# Patient Record
Sex: Male | Born: 1950
Health system: Southern US, Community
[De-identification: ages and names within clinical notes are randomized; demographics above are authoritative.]

## PROBLEM LIST (undated history)

## (undated) DIAGNOSIS — C801 Malignant (primary) neoplasm, unspecified: Secondary | ICD-10-CM

## (undated) DIAGNOSIS — E785 Hyperlipidemia, unspecified: Secondary | ICD-10-CM

## (undated) DIAGNOSIS — I4891 Unspecified atrial fibrillation: Secondary | ICD-10-CM

## (undated) DIAGNOSIS — R05 Cough: Secondary | ICD-10-CM

## (undated) DIAGNOSIS — E876 Hypokalemia: Secondary | ICD-10-CM

## (undated) DIAGNOSIS — R55 Syncope and collapse: Secondary | ICD-10-CM

## (undated) DIAGNOSIS — L570 Actinic keratosis: Secondary | ICD-10-CM

## (undated) DIAGNOSIS — I10 Essential (primary) hypertension: Secondary | ICD-10-CM

## (undated) DIAGNOSIS — R054 Cough syncope: Secondary | ICD-10-CM

## (undated) HISTORY — DX: Cough: R05

## (undated) HISTORY — DX: Syncope and collapse: R55

## (undated) HISTORY — DX: Hypokalemia: E87.6

## (undated) HISTORY — DX: Hyperlipidemia, unspecified: E78.5

## (undated) HISTORY — DX: Essential (primary) hypertension: I10

## (undated) HISTORY — DX: Cough syncope: R05.4

## (undated) HISTORY — DX: Actinic keratosis: L57.0

## (undated) HISTORY — DX: Malignant (primary) neoplasm, unspecified: C80.1

## (undated) HISTORY — DX: Unspecified atrial fibrillation: I48.91

## (undated) HISTORY — PX: TONSILLECTOMY AND ADENOIDECTOMY: SUR1326

---

## 2005-07-15 ENCOUNTER — Ambulatory Visit: Payer: Self-pay | Admitting: Gastroenterology

## 2005-07-15 LAB — HM COLONOSCOPY

## 2009-01-05 ENCOUNTER — Inpatient Hospital Stay (HOSPITAL_COMMUNITY): Admission: EM | Admit: 2009-01-05 | Discharge: 2009-01-06 | Payer: Self-pay | Admitting: Emergency Medicine

## 2009-01-05 ENCOUNTER — Ambulatory Visit: Payer: Self-pay | Admitting: Internal Medicine

## 2009-01-07 ENCOUNTER — Ambulatory Visit: Payer: Self-pay | Admitting: Cardiology

## 2009-01-07 ENCOUNTER — Observation Stay (HOSPITAL_COMMUNITY): Admission: EM | Admit: 2009-01-07 | Discharge: 2009-01-10 | Payer: Self-pay | Admitting: Emergency Medicine

## 2009-01-07 ENCOUNTER — Telehealth: Payer: Self-pay | Admitting: Nurse Practitioner

## 2009-01-09 ENCOUNTER — Encounter: Payer: Self-pay | Admitting: Cardiology

## 2009-01-17 ENCOUNTER — Telehealth (INDEPENDENT_AMBULATORY_CARE_PROVIDER_SITE_OTHER): Payer: Self-pay

## 2009-01-18 ENCOUNTER — Ambulatory Visit: Payer: Self-pay

## 2009-01-18 ENCOUNTER — Ambulatory Visit: Payer: Self-pay | Admitting: Cardiology

## 2009-01-18 ENCOUNTER — Encounter (HOSPITAL_COMMUNITY): Admission: RE | Admit: 2009-01-18 | Discharge: 2009-02-08 | Payer: Self-pay | Admitting: Internal Medicine

## 2009-01-20 DIAGNOSIS — E785 Hyperlipidemia, unspecified: Secondary | ICD-10-CM | POA: Insufficient documentation

## 2009-01-20 DIAGNOSIS — M109 Gout, unspecified: Secondary | ICD-10-CM | POA: Insufficient documentation

## 2009-01-23 ENCOUNTER — Ambulatory Visit: Payer: Self-pay | Admitting: Cardiology

## 2009-01-23 ENCOUNTER — Ambulatory Visit: Payer: Self-pay | Admitting: Internal Medicine

## 2009-01-23 ENCOUNTER — Ambulatory Visit: Payer: Self-pay

## 2009-01-23 ENCOUNTER — Encounter: Payer: Self-pay | Admitting: Internal Medicine

## 2009-01-23 ENCOUNTER — Ambulatory Visit (HOSPITAL_COMMUNITY): Admission: RE | Admit: 2009-01-23 | Discharge: 2009-01-23 | Payer: Self-pay | Admitting: Cardiology

## 2009-01-23 DIAGNOSIS — R059 Cough, unspecified: Secondary | ICD-10-CM | POA: Insufficient documentation

## 2009-01-23 DIAGNOSIS — I4891 Unspecified atrial fibrillation: Secondary | ICD-10-CM | POA: Insufficient documentation

## 2009-01-23 DIAGNOSIS — R05 Cough: Secondary | ICD-10-CM

## 2009-01-23 DIAGNOSIS — I309 Acute pericarditis, unspecified: Secondary | ICD-10-CM | POA: Insufficient documentation

## 2009-01-27 ENCOUNTER — Telehealth (INDEPENDENT_AMBULATORY_CARE_PROVIDER_SITE_OTHER): Payer: Self-pay | Admitting: Physician Assistant

## 2009-01-31 ENCOUNTER — Encounter: Payer: Self-pay | Admitting: Internal Medicine

## 2009-01-31 ENCOUNTER — Ambulatory Visit: Payer: Self-pay | Admitting: Internal Medicine

## 2009-01-31 ENCOUNTER — Ambulatory Visit: Payer: Self-pay

## 2009-01-31 DIAGNOSIS — E876 Hypokalemia: Secondary | ICD-10-CM | POA: Insufficient documentation

## 2009-02-02 ENCOUNTER — Telehealth (INDEPENDENT_AMBULATORY_CARE_PROVIDER_SITE_OTHER): Payer: Self-pay | Admitting: *Deleted

## 2009-02-02 ENCOUNTER — Telehealth: Payer: Self-pay | Admitting: Adult Health

## 2009-02-02 LAB — CONVERTED CEMR LAB
Calcium: 9 mg/dL (ref 8.4–10.5)
GFR calc non Af Amer: 59.93 mL/min (ref >60)
Potassium: 4.4 meq/L (ref 3.5–5.1)
Sodium: 143 meq/L (ref 135–145)

## 2009-02-20 ENCOUNTER — Ambulatory Visit: Payer: Self-pay | Admitting: Internal Medicine

## 2009-02-20 DIAGNOSIS — I10 Essential (primary) hypertension: Secondary | ICD-10-CM | POA: Insufficient documentation

## 2009-03-06 ENCOUNTER — Ambulatory Visit: Payer: Self-pay | Admitting: Family Medicine

## 2009-04-19 ENCOUNTER — Encounter: Payer: Self-pay | Admitting: Internal Medicine

## 2009-04-20 ENCOUNTER — Encounter: Payer: Self-pay | Admitting: Internal Medicine

## 2009-04-21 ENCOUNTER — Ambulatory Visit: Payer: Self-pay | Admitting: Internal Medicine

## 2009-07-21 ENCOUNTER — Encounter: Payer: Self-pay | Admitting: Internal Medicine

## 2009-07-31 ENCOUNTER — Ambulatory Visit: Payer: Self-pay | Admitting: Internal Medicine

## 2009-12-20 ENCOUNTER — Encounter: Payer: Self-pay | Admitting: Internal Medicine

## 2009-12-25 ENCOUNTER — Encounter: Payer: Self-pay | Admitting: Internal Medicine

## 2010-01-17 ENCOUNTER — Ambulatory Visit: Payer: Self-pay | Admitting: Internal Medicine

## 2010-01-17 ENCOUNTER — Encounter: Payer: Self-pay | Admitting: Internal Medicine

## 2010-02-26 ENCOUNTER — Ambulatory Visit: Payer: Self-pay | Admitting: Family Medicine

## 2010-03-14 NOTE — Progress Notes (Signed)
Summary: Patient's at Home Vitals  Patient's at Home Vitals   Imported By: Marylou Mccoy 02/22/2009 13:32:51  _____________________________________________________________________  External Attachment:    Type:   Image     Comment:   External Document

## 2010-03-14 NOTE — Progress Notes (Signed)
Summary: Patient At Home Vitals   Patient At Home Vitals   Imported By: Roderic Ovens 05/04/2009 12:12:49  _____________________________________________________________________  External Attachment:    Type:   Image     Comment:   External Document

## 2010-03-14 NOTE — Assessment & Plan Note (Signed)
Summary: per check out/sf   Visit Type:  Follow-up Primary Provider:  Franne Forts   History of Present Illness: The patient presents today for routine electrophysiology followup. He reports doing very well since last being seen in our clinic. The patient denies symptoms of palpitations, chest pain, shortness of breath, orthopnea, PND, lower extremity edema, dizziness, presyncope, syncope, or neurologic sequela. The patient is tolerating medications without difficulties and is otherwise without complaint today.   Current Medications (verified): 1)  Metoprolol Succinate 50 Mg Xr24h-Tab (Metoprolol Succinate) .... Take 1/2 Tab Two Times A Day 2)  Flecainide Acetate 100 Mg Tabs (Flecainide Acetate) .... Take One Half Tablet By Mouth Every 12 Hours 3)  Aspirin Ec 325 Mg Tbec (Aspirin) .... Take One Tablet By Mouth Daily 4)  Citracal Plus  Tabs (Multiple Minerals-Vitamins) .... 2 Tabs Once Daily 5)  Colchicine 0.6 Mg Tabs (Colchicine) .... Take One Tablet By Mouth Once Daily. 6)  Cyclobenzaprine Hcl 10 Mg Tabs (Cyclobenzaprine Hcl) .... As Needed 7)  Fish Oil   Oil (Fish Oil) .... 3 Caps Once Daily 8)  Meloxicam 15 Mg Tabs (Meloxicam) .Marland Kitchen.. 1 Tab As Needed 9)  Simvastatin 20 Mg Tabs (Simvastatin) .... Take One Tablet By Mouth Daily At Bedtime 10)  Tricor 145 Mg Tabs (Fenofibrate) .... Take One Tablet By Mouth Once Daily. 11)  Vitamin D (Ergocalciferol) 50000 Unit Caps (Ergocalciferol) .... Monthly 12)  Zolpidem Tartrate 10 Mg Tabs (Zolpidem Tartrate) .... At Bedtime As Needed 13)  Hydrochlorothiazide 25 Mg Tabs (Hydrochlorothiazide) .... Take One Tablet By Mouth Daily.  Allergies (verified): No Known Drug Allergies  Past History:  Past Medical History: paroxysmal atrial fibrillation and atrial flutter Gout  HL HTN DM  Family History: Reviewed history from 01/20/2009 and no changes required.  His mother is 47 with paroxysmal AFib.  His father died   of MI at 40.   Social History:  He denies any tobacco use.  He occasionally drinks   alcohol.  He is married.   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  Lives in [place].  Review of Systems       All systems are reviewed and negative except as listed in the HPI.    Vital Signs:  Patient profile:   60 year old male Height:      70 inches Weight:      189 pounds BMI:     27.22 Pulse rate:   56 / minute BP sitting:   122 / 80  (left arm)  Vitals Entered By: Laurance Flatten CMA (February 20, 2009 10:30 AM)  Physical Exam  General:  Well developed, well nourished, in no acute distress. Head:  normocephalic and atraumatic Eyes:  PERRLA/EOM intact; conjunctiva and lids normal. Nose:  no deformity, discharge, inflammation, or lesions Mouth:  Teeth, gums and palate normal. Oral mucosa normal. Neck:  Neck supple, no JVD. No masses, thyromegaly or abnormal cervical nodes. Lungs:  Clear bilaterally to auscultation and percussion. Heart:  RRR, rub has resolved. no murmurs Abdomen:  Bowel sounds positive; abdomen soft and non-tender without masses, organomegaly, or hernias noted. No hepatosplenomegaly. Msk:  Back normal, normal gait. Muscle strength and tone normal. Pulses:  pulses normal in all 4 extremities Extremities:  No clubbing or cyanosis. Neurologic:  Alert and oriented x 3.  CNII-XII intact, strength/sensation are intact Skin:  Intact without lesions or rashes. Cervical Nodes:  no significant adenopathy Psych:  Normal affect.   Nuclear Study  Procedure date:  01/18/2009  Findings:       Overall Impression   Exercise Capacity: Fair exercise capacity. BP Response: Normal blood pressure response. Clinical Symptoms: There is dyspnea. ECG Impression: No significant ST segment change suggestive of ischemia. Overall Impression: There is no sign of scar or ischemia; note patient achieved THR of 76% and ischemia at higher work loads cannot be excluded.    Signed by Ferman Hamming, MD,  [facility] on 01/18/2009 at 5:59 PM   Echocardiogram  Procedure date:  01/09/2009  Findings:       Study Conclusions    1. Left ventricle: The cavity size was normal. Wall thickness was      normal. Systolic function was normal. The estimated ejection      fraction was in the range of 60% to 65%. Wall motion was normal;      there were no regional wall motion abnormalities. Doppler      parameters are consistent with abnormal left ventricular      relaxation (grade 1 diastolic dysfunction).   2. Aortic valve: There was no stenosis.   3. Mitral valve: No significant regurgitation.   4. Left atrium: The atrium was mildly dilated.   5. Right ventricle: The cavity size was normal. Systolic function      was normal.   6. Pulmonary arteries: PA peak pressure: 24mm Hg (S).   7. Systemic veins: The IVC was not visualized.   Impressions:    - Normal LV size and systolic function, EF 60-65%. No regional wall     motion abnormalities. No significant valvular abnormalities. Mild     left atrial enlargement.   Transthoracic echocardiography. M-mode, complete 2D, spectral   Doppler, and color Doppler. Height: Height: 177.8cm. Height: 70in.   Weight: Weight: 191kg. Weight: 420.2lb. Body mass index: BMI:   60.4kg/m^2. Body surface area: BSA: 3.40m^2. Patient status:   Inpatient.  Prepared and Electronically Authenticated by    Marca Ancona, MD  CXR  Procedure date:  01/05/2009  Findings:       Findings: The heart size and pulmonary vascularity are normal and   the lungs are clear.  No acute bony abnormalities.    IMPRESSION:   No acute disease.    Read By:  Gwynn Burly,  M.D.    Signed by Laurance Flatten CMA on 01/20/2009 at 4:29 PM  ________________________________________________________________________     Echocardiogram  Procedure date:  01/23/2009  Findings:       Study Conclusions    - Left ventricle: The cavity size was normal. Wall thickness was     normal.  Systolic function was normal. The estimated ejection     fraction was in the range of 50% to 55%. Wall motion was normal;     there were no regional wall motion abnormalities.   - Left atrium: The atrium was mildly dilated.   - Pericardium, extracardiac: A small pericardial effusion was     identified.   Impressions:    - Limited study to R/O pericardial effusion; no doppler performed.   Transthoracic echocardiography. M-mode, limited 2D, limited spectral   Doppler, and color Doppler. Height: Height: 177.8cm. Height: 70in.   Weight: Weight: 93.6kg. Weight: 206lb. Body mass index: BMI:   29.6kg/m^2. Body surface area: BSA: 2.43m^2. Blood pressure: 144/80.   Patient status: Outpatient. Location: [facility]    Prepared and Electronically Authenticated by    Olga Millers, MD, [facility]    EKG  Procedure date:  02/20/2009  Findings:      sinus rhythm 56 bpm, PR 230, TWI in II/III/VF, otherwise normal ekg  Impression & Recommendations:  Problem # 1:  ATRIAL FIBRILLATION (ICD-427.31) Doing well without further symptoms of arrhythmia. Tolerating flecainide. He has recently been diagnosed with both HTN and DM.  This elevates his CHADS2 score to 2 and thus alters his risks for stroke. We had a long discussion today regarding aspirin, vs coumadin or dabigatran. The patient has chosen dabigatran for stroke prevention.  We will therefore start 150mg  two times a day today.  His updated medication list for this problem includes:    Metoprolol Succinate 50 Mg Xr24h-tab (Metoprolol succinate) .Marland Kitchen... Take 1/2 tab two times a day    Flecainide Acetate 100 Mg Tabs (Flecainide acetate) .Marland Kitchen... Take one half tablet by mouth every 12 hours    Aspirin Ec 325 Mg Tbec (Aspirin) .Marland Kitchen... Take one tablet by mouth daily  Problem # 2:  COUGH SYNCOPE (ICD-786.2) No further afib.  His updated medication list for this problem includes:    Metoprolol Succinate 50 Mg Xr24h-tab (Metoprolol succinate) .Marland Kitchen...  Take 1/2 tab two times a day    Flecainide Acetate 100 Mg Tabs (Flecainide acetate) .Marland Kitchen... Take one half tablet by mouth every 12 hours    Aspirin Ec 325 Mg Tbec (Aspirin) .Marland Kitchen... Take one tablet by mouth daily  Problem # 3:  UNSPECIFIED ACUTE PERICARDITIS (ICD-420.90) rub has resolved He has TWI in the inferior leads on 12 lead ekg which appear stable.  We had a long discussion about this today.  The patient has had a low risk nuclear scan previously, though he did not achieve target heart rate with the study.  As he has no symptoms of ischemia, he declines cath at this time.  Should he develop any symptoms of ischemia, then we will consider LHC.  The following medications were removed from the medication list:    Furosemide 20 Mg Tabs (Furosemide) .Marland Kitchen... Take one tablet by mouth daily as needed His updated medication list for this problem includes:    Meloxicam 15 Mg Tabs (Meloxicam) .Marland Kitchen... 1 tab as needed    Hydrochlorothiazide 25 Mg Tabs (Hydrochlorothiazide) .Marland Kitchen... Take one tablet by mouth daily.  Problem # 4:  HYPERTENSION, BENIGN (ICD-401.1) stable,  no changes  The following medications were removed from the medication list:    Furosemide 20 Mg Tabs (Furosemide) .Marland Kitchen... Take one tablet by mouth daily as needed His updated medication list for this problem includes:    Metoprolol Succinate 50 Mg Xr24h-tab (Metoprolol succinate) .Marland Kitchen... Take 1/2 tab two times a day    Aspirin Ec 325 Mg Tbec (Aspirin) .Marland Kitchen... Take one tablet by mouth daily    Hydrochlorothiazide 25 Mg Tabs (Hydrochlorothiazide) .Marland Kitchen... Take one tablet by mouth daily.  Patient Instructions: 1)  Your physician recommends that you schedule a follow-up appointment in: 2 months with Dr Johney Frame 2)  Your physician has recommended you make the following change in your medication: start Pradaxa 150mg  two times a day

## 2010-03-14 NOTE — Assessment & Plan Note (Signed)
Summary: device/saf   Visit Type:  Pacemaker check Primary Provider:  Franne Forts  CC:  pt denies any cardiac complaints today.  History of Present Illness: The patient presents today for routine electrophysiology followup. He reports doing very well since last being seen in our clinic.   He is unaware of any episodes of afib. The patient denies symptoms of palpitations, chest pain, shortness of breath, orthopnea, PND, lower extremity edema, presyncope, syncope, or neurologic sequela.  He has begun exercising and has lost 30 lbs. The patient is tolerating medications without difficulties and is otherwise without complaint today.   Current Medications (verified): 1)  Metoprolol Succinate 50 Mg Xr24h-Tab (Metoprolol Succinate) .... Take 1/2 Tab Two Times A Day 2)  Flecainide Acetate 100 Mg Tabs (Flecainide Acetate) .... Take One Half Tablet By Mouth Every 12 Hours 3)  Fish Oil 1000 Mg Caps (Omega-3 Fatty Acids) .... 2 Caps Qam...1 Cap At Bedtime 4)  Meloxicam 15 Mg Tabs (Meloxicam) .Marland Kitchen.. 1 Tab As Needed 5)  Vitamin D 1000 Unit Tabs (Cholecalciferol) .Marland Kitchen.. 1 Cap Once Daily 6)  Pradaxa 150 Mg Caps (Dabigatran Etexilate Mesylate) .Marland Kitchen.. 1 Cap Two Times A Day 7)  Tramadol Hcl 50 Mg Tabs (Tramadol Hcl) .Marland Kitchen.. 1 Tab Once Daily 8)  Centrum Silver  Tabs (Multiple Vitamins-Minerals) .Marland Kitchen.. 1 Tab Once Daily  Allergies (verified): No Known Drug Allergies  Past History:  Past Medical History: Reviewed history from 02/20/2009 and no changes required. paroxysmal atrial fibrillation and atrial flutter Gout  HL HTN DM  Social History: Reviewed history from 02/20/2009 and no changes required.  He denies any tobacco use.  He occasionally drinks   alcohol.  He is married.   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  Review of Systems       All systems are reviewed and negative except as listed in the HPI.   Vital Signs:  Patient profile:   60 year old male Height:       70 inches Weight:      182.75 pounds BMI:     26.32 Pulse rate:   53 / minute Pulse rhythm:   irregular BP sitting:   106 / 62  (left arm) Cuff size:   regular  Vitals Entered By: [name], CMA (January 17, 2010 11:43 AM)  Physical Exam  General:  Well developed, well nourished, in no acute distress. Head:  normocephalic and atraumatic Eyes:  PERRLA/EOM intact; conjunctiva and lids normal. Mouth:  Teeth, gums and palate normal. Oral mucosa normal. Neck:  Neck supple, no JVD. No masses, thyromegaly or abnormal cervical nodes. Lungs:  Clear bilaterally to auscultation and percussion. Heart:  Non-displaced PMI, chest non-tender; regular rate and rhythm, S1, S2 without murmurs, rubs or gallops. Carotid upstroke normal, no bruit. Normal abdominal aortic size, no bruits. Femorals normal pulses, no bruits. Pedals normal pulses. No edema, no varicosities. Abdomen:  Bowel sounds positive; abdomen soft and non-tender without masses, organomegaly, or hernias noted. No hepatosplenomegaly. Msk:  Back normal, normal gait. Muscle strength and tone normal. Pulses:  pulses normal in all 4 extremities Extremities:  No clubbing or cyanosis. Neurologic:  Alert and oriented x 3.   EKG  Procedure date:  01/17/2010  Findings:      sinus bradycardia 53 bpm, PR 260, QTc 414, otherwise normal ekg  Impression & Recommendations:  Problem # 1:  ATRIAL FIBRILLATION (ICD-427.31) doing well with low dose flecainide if he remains without afib, we  could consider stopping flecainide upon return. continue pradaxa 150mg  two times a day for CHADS2 score of 2.  He denies difficulty with bleeding.  If his hypertension/ diabetes resolve with lifestyle modification and he has no further afib, ASA may eventually be an option.  He does report fatigue with toprol XL I have reduced toprol to 12.5mg  daily today.  Problem # 2:  HYPERTENSION, BENIGN (ICD-401.1) at goal  Problem # 3:  DYSLIPIDEMIA  (ICD-272.4) stable The following medications were removed from the medication list:    Simvastatin 20 Mg Tabs (Simvastatin) .Marland Kitchen... Take one tablet by mouth daily at bedtime  Patient Instructions: 1)  Your physician has recommended you make the following change in your medication: Metoprolol 12.5mg  daily. 2)  Your physician wants you to follow-up in:1 year   You will receive a reminder letter in the mail two months in advance. If you don't receive a letter, please call our office to schedule the follow-up appointment. Prescriptions: METOPROLOL SUCCINATE 25 MG XR24H-TAB (METOPROLOL SUCCINATE) take 1/2 pill daily  #30 x 6   Entered by:   Claris Gladden RN   Authorized by:   Hillis Range, MD   Signed by:   Claris Gladden RN on 01/17/2010   Method used:   Electronically to        [facility]* (retail)       [address]       [place], [place]  [address]       Ph: [phone]       Fax: [phone]   RxID:   [id number]

## 2010-03-14 NOTE — Assessment & Plan Note (Signed)
Summary: 2 month rov/sl   Primary Provider:  Franne Forts  CC:  2 month rov/  pt feeling pretty good.  He has no complaints at this time.  History of Present Illness: The patient presents today for routine electrophysiology followup. He reports doing very well since last being seen in our clinic.  He is unaware of any further afib.  He denies syncope.  The patient denies symptoms of palpitations, chest pain, shortness of breath, orthopnea, PND, lower extremity edema, dizziness, presyncope,  or neurologic sequela. The patient is tolerating medications without difficulties and is otherwise without complaint today.   Current Medications (verified): 1)  Metoprolol Succinate 50 Mg Xr24h-Tab (Metoprolol Succinate) .... Take 1/2 Tab Two Times A Day 2)  Flecainide Acetate 100 Mg Tabs (Flecainide Acetate) .... Take One Half Tablet By Mouth Every 12 Hours 3)  Citracal Plus  Tabs (Multiple Minerals-Vitamins) .... 2 Tabs Once Daily 4)  Colchicine 0.6 Mg Tabs (Colchicine) .... Take One Tablet By Mouth Once Daily. 5)  Cyclobenzaprine Hcl 10 Mg Tabs (Cyclobenzaprine Hcl) .... As Needed 6)  Fish Oil   Oil (Fish Oil) .... 3 Caps Once Daily 7)  Meloxicam 15 Mg Tabs (Meloxicam) .Marland Kitchen.. 1 Tab As Needed 8)  Simvastatin 20 Mg Tabs (Simvastatin) .... Take One Tablet By Mouth Daily At Bedtime 9)  Tricor 145 Mg Tabs (Fenofibrate) .... Take One Tablet By Mouth Once Daily. 10)  Vitamin D (Ergocalciferol) 50000 Unit Caps (Ergocalciferol) .... Monthly 11)  Zolpidem Tartrate 10 Mg Tabs (Zolpidem Tartrate) .... At Bedtime As Needed 12)  Hydrochlorothiazide 25 Mg Tabs (Hydrochlorothiazide) .... Take One Tablet By Mouth Daily. 13)  Pradaxa 150mg  .... One By Mouth Bid  Allergies (verified): No Known Drug Allergies  Past History:  Past Medical History: Reviewed history from 02/20/2009 and no changes required. paroxysmal atrial fibrillation and atrial flutter Gout  HL HTN DM  Social History: Reviewed history from  02/20/2009 and no changes required.  He denies any tobacco use.  He occasionally drinks   alcohol.  He is married.   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  Vital Signs:  Patient profile:   60 year old male Height:      70 inches Weight:      183 pounds BMI:     26.35 Pulse rate:   50 / minute Pulse rhythm:   regular BP sitting:   110 / 73  (left arm) Cuff size:   large  Vitals Entered By: Judithe Modest CMA (April 21, 2009 9:33 AM)  Physical Exam  General:  Well developed, well nourished, in no acute distress. Head:  normocephalic and atraumatic Eyes:  PERRLA/EOM intact; conjunctiva and lids normal. Mouth:  Teeth, gums and palate normal. Oral mucosa normal. Neck:  Neck supple, no JVD. No masses, thyromegaly or abnormal cervical nodes. Lungs:  Clear bilaterally to auscultation and percussion. Heart:  Non-displaced PMI, chest non-tender; regular rate and rhythm, S1, S2 without murmurs, rubs or gallops. Carotid upstroke normal, no bruit. Normal abdominal aortic size, no bruits. Femorals normal pulses, no bruits. Pedals normal pulses. No edema, no varicosities. Abdomen:  Bowel sounds positive; abdomen soft and non-tender without masses, organomegaly, or hernias noted. No hepatosplenomegaly. Msk:  Back normal, normal gait. Muscle strength and tone normal. Pulses:  pulses normal in all 4 extremities Extremities:  No clubbing or cyanosis. Neurologic:  Alert and oriented x 3. Skin:  Intact without lesions or rashes. Psych:  Normal affect.  EKG  Procedure date:  04/21/2009  Findings:      sinus bradycardia 50 bpm, PR 274, nonspecific TWI (stable), otherwise normal ekg  Impression & Recommendations:  Problem # 1:  ATRIAL FIBRILLATION (ICD-427.31) Doing very well on low dose flecainide. no changes today CHADS2 score is 2.  He is tolerating pradaxa. Will check renal function on return.  Problem # 2:  HYPERTENSION, BENIGN (ICD-401.1) at  goal no changes today  Problem # 3:  COUGH SYNCOPE (ICD-786.2) resolved  Problem # 4:  DYSLIPIDEMIA (ICD-272.4) stable  His updated medication list for this problem includes:    Simvastatin 20 Mg Tabs (Simvastatin) .Marland Kitchen... Take one tablet by mouth daily at bedtime    Tricor 145 Mg Tabs (Fenofibrate) .Marland Kitchen... Take one tablet by mouth once daily.  Other Orders: EKG w/ Interpretation (93000)  Patient Instructions: 1)  return in 3 months 2)  contact my office if any problems arise in the interim.

## 2010-03-14 NOTE — Letter (Signed)
Summary: [facility] - Blood Sugar Report  [facility] - Blood Sugar Report   Imported By: Marylou Mccoy 08/17/2009 11:22:26  _____________________________________________________________________  External Attachment:    Type:   Image     Comment:   External Document

## 2010-03-14 NOTE — Assessment & Plan Note (Signed)
Summary: 3 month rov/sl   Visit Type:  3 mo f/u Primary Provider:  Franne Forts  CC:  no cardiac complaints today.  History of Present Illness: The patient presents today for routine electrophysiology followup. He reports doing very well since last being seen in our clinic.   He is unaware of any episodes of afib.  He reports occasional orthostatic dizziness.  The patient denies symptoms of palpitations, chest pain, shortness of breath, orthopnea, PND, lower extremity edema, presyncope, syncope, or neurologic sequela. The patient is tolerating medications without difficulties and is otherwise without complaint today.   Current Medications (verified): 1)  Metoprolol Succinate 50 Mg Xr24h-Tab (Metoprolol Succinate) .... Take 1/2 Tab Two Times A Day 2)  Flecainide Acetate 100 Mg Tabs (Flecainide Acetate) .... Take One Half Tablet By Mouth Every 12 Hours 3)  Citracal Plus  Tabs (Multiple Minerals-Vitamins) .... 2 Tabs Once Daily 4)  Colchicine 0.6 Mg Tabs (Colchicine) .... Take One Tablet By Mouth Once Daily. 5)  Cyclobenzaprine Hcl 10 Mg Tabs (Cyclobenzaprine Hcl) .... As Needed 6)  Fish Oil   Oil (Fish Oil) .... 3 Caps Once Daily 7)  Meloxicam 15 Mg Tabs (Meloxicam) .Marland Kitchen.. 1 Tab As Needed 8)  Simvastatin 20 Mg Tabs (Simvastatin) .... Take One Tablet By Mouth Daily At Bedtime 9)  Vitamin D (Ergocalciferol) 50000 Unit Caps (Ergocalciferol) .... Monthly 10)  Zolpidem Tartrate 10 Mg Tabs (Zolpidem Tartrate) .... At Bedtime As Needed 11)  Hydrochlorothiazide 25 Mg Tabs (Hydrochlorothiazide) .... Take One Tablet By Mouth Daily. 12)  Pradaxa 150mg  .... One By Mouth Bid  Allergies (verified): No Known Drug Allergies  Past History:  Past Medical History: Reviewed history from 02/20/2009 and no changes required. paroxysmal atrial fibrillation and atrial flutter Gout  HL HTN DM  Social History: Reviewed history from 02/20/2009 and no changes required.  He denies any tobacco use.  He  occasionally drinks   alcohol.  He is married.   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  Lives in [place].  Review of Systems       All systems are reviewed and negative except as listed in the HPI.   Vital Signs:  Patient profile:   60 year old male Height:      70 inches Weight:      177 pounds BMI:     25.49 Pulse rate:   48 / minute Pulse rhythm:   irregular BP sitting:   112 / 78  (left arm) Cuff size:   regular  Vitals Entered By: [name], CMA (July 31, 2009 9:25 AM)  Physical Exam  General:  Well developed, well nourished, in no acute distress. Head:  normocephalic and atraumatic Eyes:  PERRLA/EOM intact; conjunctiva and lids normal. Mouth:  Teeth, gums and palate normal. Oral mucosa normal. Neck:  Neck supple, no JVD. No masses, thyromegaly or abnormal cervical nodes. Lungs:  Clear bilaterally to auscultation and percussion. Heart:  Non-displaced PMI, chest non-tender; regular rate and rhythm, S1, S2 without murmurs, rubs or gallops. Carotid upstroke normal, no bruit. Normal abdominal aortic size, no bruits. Femorals normal pulses, no bruits. Pedals normal pulses. No edema, no varicosities. Abdomen:  Bowel sounds positive; abdomen soft and non-tender without masses, organomegaly, or hernias noted. No hepatosplenomegaly. Msk:  Back normal, normal gait. Muscle strength and tone normal. Pulses:  pulses normal in all 4 extremities Extremities:  No clubbing or cyanosis. Neurologic:  Alert and oriented x 3.   EKG  Procedure date:  07/31/2009  Findings:      sinus bradycardia 50 bpm, PR 280, nonspecific ST/T changes  Impression & Recommendations:  Problem # 1:  ATRIAL FIBRILLATION (ICD-427.31) doing well with low dose flecainide if he remains without afib, we could consider stopping flecainide upon return continue pradaxa 150mg  two times a day for CHADS2 score of 2.  He denies difficulty with bleeding  Problem # 2:  HYPERTENSION, BENIGN  (ICD-401.1) well controlled decrease hctz to 12.5mg  daily avoid dehydration this summer  Problem # 3:  COUGH SYNCOPE (ICD-786.2) resolved  Problem # 4:  DYSLIPIDEMIA (ICD-272.4) reviewed today no changes  Patient Instructions: 1)  Your physician recommends that you schedule a follow-up appointment in: 6 months with Dr Johney Frame 2)  Your physician recommends that you continue on your current medications as directed. Please refer to the Current Medication list given to you today.

## 2010-03-15 NOTE — Letter (Signed)
Summary: [facility] Office Note   [facility] Office Note   Imported By: Roderic Ovens 01/24/2010 13:58:58  _____________________________________________________________________  External Attachment:    Type:   Image     Comment:   External Document

## 2010-05-16 LAB — CK TOTAL AND CKMB (NOT AT ARMC)
CK, MB: 2.1 ng/mL (ref 0.3–4.0)
CK, MB: 2.1 ng/mL (ref 0.3–4.0)
Relative Index: INVALID (ref 0.0–2.5)
Total CK: 93 U/L (ref 7–232)

## 2010-05-16 LAB — COMPREHENSIVE METABOLIC PANEL
ALT: 49 U/L (ref 0–53)
AST: 54 U/L — ABNORMAL HIGH (ref 0–37)
Albumin: 4 g/dL (ref 3.5–5.2)
Alkaline Phosphatase: 79 U/L (ref 39–117)
BUN: 16 mg/dL (ref 6–23)
GFR calc Af Amer: >60 mL/min (ref >60)
Potassium: 4.1 mEq/L (ref 3.5–5.1)
Sodium: 136 mEq/L (ref 135–145)
Total Protein: 7.8 g/dL (ref 6.0–8.3)

## 2010-05-16 LAB — URINALYSIS, ROUTINE W REFLEX MICROSCOPIC
Bilirubin Urine: NEGATIVE
Nitrite: NEGATIVE
Specific Gravity, Urine: 1.011 (ref 1.005–1.030)
pH: 7 (ref 5.0–8.0)

## 2010-05-16 LAB — CBC
HCT: 38.2 % — ABNORMAL LOW (ref 39.0–52.0)
HCT: 42.1 % (ref 39.0–52.0)
Hemoglobin: 13.3 g/dL (ref 13.0–17.0)
Hemoglobin: 14.6 g/dL (ref 13.0–17.0)
MCHC: 34.9 g/dL (ref 30.0–36.0)
MCV: 83.6 fL (ref 78.0–100.0)
RBC: 4.57 MIL/uL (ref 4.22–5.81)
RBC: 4.99 MIL/uL (ref 4.22–5.81)
WBC: 10 10*3/uL (ref 4.0–10.5)
WBC: 9.2 10*3/uL (ref 4.0–10.5)

## 2010-05-16 LAB — POCT CARDIAC MARKERS
CKMB, poc: 1.7 ng/mL (ref 1.0–8.0)
Troponin i, poc: 0.05 ng/mL (ref 0.00–0.09)
Troponin i, poc: <0.05 ng/mL (ref 0.00–0.09)

## 2010-05-16 LAB — LIPID PANEL
Cholesterol: 186 mg/dL (ref 0–200)
LDL Cholesterol: 99 mg/dL (ref 0–99)
Triglycerides: 278 mg/dL — ABNORMAL HIGH (ref <150)

## 2010-05-16 LAB — POCT I-STAT, CHEM 8
BUN: 22 mg/dL (ref 6–23)
Calcium, Ion: 1.12 mmol/L (ref 1.12–1.32)
Chloride: 100 mEq/L (ref 96–112)
Glucose, Bld: 288 mg/dL — ABNORMAL HIGH (ref 70–99)
HCT: 40 % (ref 39.0–52.0)
Potassium: 4 mEq/L (ref 3.5–5.1)

## 2010-05-16 LAB — DIFFERENTIAL
Basophils Relative: 0 % (ref 0–1)
Basophils Relative: 1 % (ref 0–1)
Eosinophils Absolute: 0.1 10*3/uL (ref 0.0–0.7)
Eosinophils Relative: 1 % (ref 0–5)
Eosinophils Relative: 1 % (ref 0–5)
Lymphs Abs: 2.3 10*3/uL (ref 0.7–4.0)
Monocytes Absolute: 0.7 10*3/uL (ref 0.1–1.0)
Monocytes Absolute: 0.9 10*3/uL (ref 0.1–1.0)
Monocytes Relative: 8 % (ref 3–12)
Monocytes Relative: 9 % (ref 3–12)
Neutro Abs: 6.7 10*3/uL (ref 1.7–7.7)

## 2010-05-16 LAB — CARDIAC PANEL(CRET KIN+CKTOT+MB+TROPI)
CK, MB: 1.7 ng/mL (ref 0.3–4.0)
Total CK: 87 U/L (ref 7–232)

## 2010-05-16 LAB — RAPID URINE DRUG SCREEN, HOSP PERFORMED
Cocaine: NOT DETECTED
Opiates: NOT DETECTED
Tetrahydrocannabinol: NOT DETECTED

## 2010-05-16 LAB — TSH: TSH: 2.197 u[IU]/mL (ref 0.350–4.500)

## 2010-05-16 LAB — TROPONIN I: Troponin I: 0.08 ng/mL — ABNORMAL HIGH (ref 0.00–0.06)

## 2011-01-09 ENCOUNTER — Other Ambulatory Visit: Payer: Self-pay | Admitting: Internal Medicine

## 2011-01-23 ENCOUNTER — Ambulatory Visit (INDEPENDENT_AMBULATORY_CARE_PROVIDER_SITE_OTHER): Payer: Self-pay | Admitting: Internal Medicine

## 2011-01-23 ENCOUNTER — Encounter: Payer: Self-pay | Admitting: *Deleted

## 2011-01-23 VITALS — BP 126/74 | HR 66 | Ht 69.0 in | Wt 184.0 lb

## 2011-01-23 DIAGNOSIS — I4891 Unspecified atrial fibrillation: Secondary | ICD-10-CM

## 2011-01-23 DIAGNOSIS — I10 Essential (primary) hypertension: Secondary | ICD-10-CM

## 2011-01-23 NOTE — Patient Instructions (Signed)
Your physician wants you to follow-up in: 12 months with Dr Jacquiline Doe will receive a reminder letter in the mail two months in advance. If you don't receive a letter, please call our office to schedule the follow-up  appointment.  Your physician has recommended you make the following change in your medication: STOP Flecainide  Dr Azzie Roup is the name of Rheumatologist

## 2011-01-23 NOTE — Progress Notes (Signed)
PCP:  Dr Franne Forts  The patient presents today for routine electrophysiology followup.  Since last being seen in our clinic, the patient reports doing very well. He is unaware of any further afib. Today, he denies symptoms of palpitations, chest pain, shortness of breath, orthopnea, PND, lower extremity edema, dizziness, presyncope, syncope, or neurologic sequela.  The patient feels that he is tolerating medications without difficulties and is otherwise without complaint today.   Past Medical History  Diagnosis Date  . Hypertension   . Hypopotassemia   . Atrial fibrillation     paroxysmal  . Dyslipidemia   . Cough syncope   . Gout   . Diabetes mellitus   . Hyperlipidemia    No past surgical history on file.  Current Outpatient Prescriptions  Medication Sig Dispense Refill  . allopurinol (ZYLOPRIM) 300 MG tablet Take 300 mg by mouth daily.        . colchicine 0.6 MG tablet Take 0.6 mg by mouth daily.        . cyclobenzaprine (FLEXERIL) 10 MG tablet Take 10 mg by mouth at bedtime as needed.        . dabigatran (PRADAXA) 150 MG CAPS Take 150 mg by mouth every 12 (twelve) hours.        . flecainide (TAMBOCOR) 100 MG tablet TAKE ONE-HALF (1/2) TABLET EVERY 12 HOURS  135 tablet  0  . HYDROcodone-acetaminophen (NORCO) 5-325 MG per tablet Take 1 tablet by mouth every 6 (six) hours as needed.        . indomethacin (INDOCIN) 50 MG capsule Take 50 mg by mouth 3 (three) times daily.        . metoprolol succinate (TOPROL-XL) 25 MG 24 hr tablet Take 12.5 mg by mouth daily.        . multivitamin (THERAGRAN) per tablet Take 1 tablet by mouth daily.          No Known Allergies  History   Social History  . Marital Status: Married    Spouse Name: N/A    Number of Children: N/A  . Years of Education: N/A   Occupational History  . Not on file.   Social History Main Topics  . Smoking status: Former Smoker    Types: Cigarettes    Quit date: 01/23/1971  . Smokeless tobacco: Never Used  .  Alcohol Use: Yes     occasionally drinks alcohol  . Drug Use: Not on file  . Sexually Active: Not on file   Other Topics Concern  . Not on file   Social History Narrative    SOCIAL HISTORY:  He lives in [place] with his wife.  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  He has 2 adult sons.  He exercises as stated above.  No tobacco or illicit substance use.  He does enjoy occasional beer and vodka, mixed drink on the weekends.  He tries to follow a low-cholesterol diet.    FAMILY HISTORY:  Mother is alive, she has a pacemaker secondary to heart block, she also has a stable abdominal aneurysm.  Father's health, interesting that he had an MI at age 64 and then apparently died in his sleep at age 82 that was felt to be secondary to MI.  He has 3 siblings who are alive and well.     Family History  Problem Relation Age of Onset  . Atrial fibrillation Mother   . Heart attack Father    Physical Exam: Filed Vitals:  01/23/11 0905  BP: 126/74  Pulse: 66  Height: 5\' 9"  (1.753 m)  Weight: 184 lb (83.462 kg)    GEN- The patient is well appearing, alert and oriented x 3 today.   Head- normocephalic, atraumatic Eyes-  Sclera clear, conjunctiva pink Ears- hearing intact Oropharynx- clear Neck- supple, no JVP Lymph- no cervical lymphadenopathy Lungs- Clear to ausculation bilaterally, normal work of breathing Heart- Regular rate and rhythm, no murmurs, rubs or gallops, PMI not laterally displaced GI- soft, NT, ND, + BS Extremities- no clubbing, cyanosis, or edema MS- no significant deformity or atrophy Skin- no rash or lesion Psych- euthymic mood, full affect Neuro- strength and sensation are intact  ekg today reveals sinus rhythm 66 bpm, PR 238, early repolarization with ST elevation (mild) in II,III, VF  Assessment and Plan:

## 2011-01-23 NOTE — Assessment & Plan Note (Signed)
Stable No change required today  

## 2011-01-23 NOTE — Assessment & Plan Note (Signed)
No symptomatic afib in a year Stop flecainide Continue pradaxa and metoprolol  Return in 12 months

## 2011-01-23 NOTE — Progress Notes (Signed)
Addended by: Dennis Bast F on: 01/23/2011 09:33 AM   Modules accepted: Orders

## 2011-03-18 ENCOUNTER — Other Ambulatory Visit: Payer: Self-pay

## 2011-03-18 MED ORDER — DABIGATRAN ETEXILATE MESYLATE 150 MG PO CAPS: 150.0000 mg | ORAL_CAPSULE | Freq: Two times a day (BID) | ORAL | Status: DC

## 2011-03-19 ENCOUNTER — Telehealth: Payer: Self-pay | Admitting: Internal Medicine

## 2011-03-19 NOTE — Telephone Encounter (Signed)
Pt Rx for pradaxa was sent to [facility] wrong Pharm for that medication he uses [facility] in [place] [phone]

## 2011-03-20 ENCOUNTER — Other Ambulatory Visit: Payer: Self-pay | Admitting: *Deleted

## 2011-03-20 MED ORDER — DABIGATRAN ETEXILATE MESYLATE 150 MG PO CAPS: 150.0000 mg | ORAL_CAPSULE | Freq: Two times a day (BID) | ORAL | Status: DC

## 2011-08-06 ENCOUNTER — Other Ambulatory Visit: Payer: Self-pay

## 2011-08-06 MED ORDER — DABIGATRAN ETEXILATE MESYLATE 150 MG PO CAPS: 150.0000 mg | ORAL_CAPSULE | Freq: Two times a day (BID) | ORAL | Status: DC

## 2011-10-03 ENCOUNTER — Ambulatory Visit: Payer: Self-pay | Admitting: Family Medicine

## 2012-01-07 ENCOUNTER — Other Ambulatory Visit: Payer: Self-pay | Admitting: *Deleted

## 2012-01-07 MED ORDER — DABIGATRAN ETEXILATE MESYLATE 150 MG PO CAPS: 150.0000 mg | ORAL_CAPSULE | Freq: Two times a day (BID) | ORAL | Status: DC

## 2012-01-30 ENCOUNTER — Encounter: Payer: Self-pay | Admitting: Internal Medicine

## 2012-01-30 ENCOUNTER — Ambulatory Visit (INDEPENDENT_AMBULATORY_CARE_PROVIDER_SITE_OTHER): Payer: 59 | Admitting: Internal Medicine

## 2012-01-30 VITALS — BP 122/78 | HR 75 | Ht 69.0 in | Wt 178.0 lb

## 2012-01-30 DIAGNOSIS — I4891 Unspecified atrial fibrillation: Secondary | ICD-10-CM

## 2012-01-30 NOTE — Progress Notes (Signed)
PCP: No primary provider on file. Primary Cardiologist:  Benjamin [name] is a 61 y.o. male who presents today for routine electrophysiology followup.  Since last being seen in our clinic, the patient reports doing very well.  Today, he denies symptoms of palpitations, chest pain, shortness of breath,  lower extremity edema, dizziness, presyncope, or syncope.  The patient is otherwise without complaint today.   Past Medical History  Diagnosis Date  . Hypertension   . Hypopotassemia   . Atrial fibrillation     paroxysmal  . Dyslipidemia   . Cough syncope   . Gout   . Diabetes mellitus   . Hyperlipidemia    No past surgical history on file.  Current Outpatient Prescriptions  Medication Sig Dispense Refill  . allopurinol (ZYLOPRIM) 300 MG tablet Take 300 mg by mouth daily.        . colchicine 0.6 MG tablet Take 0.6 mg by mouth daily.        . cyclobenzaprine (FLEXERIL) 10 MG tablet Take 10 mg by mouth at bedtime as needed.        . dabigatran (PRADAXA) 150 MG CAPS Take 1 capsule (150 mg total) by mouth every 12 (twelve) hours.  60 capsule  1  . HYDROcodone-acetaminophen (NORCO) 5-325 MG per tablet Take 1 tablet by mouth every 6 (six) hours as needed.          Physical Exam: Filed Vitals:   01/30/12 1242  BP: 122/78  Pulse: 75  Height: 5\' 9"  (1.753 m)  Weight: 178 lb (80.74 kg)  SpO2: 97%    GEN- The patient is well appearing, alert and oriented x 3 today.   Head- normocephalic, atraumatic Eyes-  Sclera clear, conjunctiva pink Ears- hearing intact Oropharynx- clear Lungs- Clear to ausculation bilaterally, normal work of breathing Heart- Regular rate and rhythm, no murmurs, rubs or gallops, PMI not laterally displaced GI- soft, NT, ND, + BS Extremities- no clubbing, cyanosis, or edema  ekg today reveals sinus rhythm  Assessment and Plan:  1. afib  Maintaining sinus rhythm, without symptomatic recurrence in over two years Stop toprol Very difficult to know what to do  with anticoagulation.  Though he carries a history of diabetes and htn, these are both well managed without medicine following weight loss.  He has also had only a single episode of afib several years ago. We had a long discussion today weighing options of stopping anticoagulation going forward.  As he is doing well on pradaxa, he would like to continue this presently.  Certainly, I think that our guidelines would support this decision.  Return in 1year

## 2012-01-30 NOTE — Patient Instructions (Signed)
Your physician wants you to follow-up in: 1 year with Dr Jacquiline Doe will receive a reminder letter in the mail two months in advance. If you don't receive a letter, please call our office to schedule the follow-up appointment.  Your physician has recommended you make the following change in your medication: STOP Metoprolol

## 2012-03-06 ENCOUNTER — Other Ambulatory Visit: Payer: Self-pay | Admitting: *Deleted

## 2012-03-06 MED ORDER — DABIGATRAN ETEXILATE MESYLATE 150 MG PO CAPS: 150.0000 mg | ORAL_CAPSULE | Freq: Two times a day (BID) | ORAL | Status: DC

## 2012-03-28 ENCOUNTER — Other Ambulatory Visit: Payer: Self-pay

## 2012-12-17 ENCOUNTER — Other Ambulatory Visit: Payer: Self-pay

## 2013-01-27 ENCOUNTER — Encounter: Payer: Self-pay | Admitting: Internal Medicine

## 2013-01-27 ENCOUNTER — Ambulatory Visit (INDEPENDENT_AMBULATORY_CARE_PROVIDER_SITE_OTHER): Payer: 59 | Admitting: Internal Medicine

## 2013-01-27 VITALS — BP 126/78 | HR 76 | Ht 69.0 in | Wt 182.0 lb

## 2013-01-27 DIAGNOSIS — I4891 Unspecified atrial fibrillation: Secondary | ICD-10-CM

## 2013-01-27 DIAGNOSIS — E785 Hyperlipidemia, unspecified: Secondary | ICD-10-CM

## 2013-01-27 DIAGNOSIS — I10 Essential (primary) hypertension: Secondary | ICD-10-CM

## 2013-01-27 NOTE — Patient Instructions (Addendum)
Your physician wants you to follow-up in:12 months with Dr Johney Frame

## 2013-01-27 NOTE — Progress Notes (Signed)
PCP: Dr Julieanne Manson  Benjamin [name] is a 62 y.o. male who presents today for routine electrophysiology followup.  Since last being seen in our clinic, the patient reports doing very well.  He is not aware of any afib. Today, he denies symptoms of palpitations, chest pain, shortness of breath,  lower extremity edema, dizziness, presyncope, or syncope.  The patient is otherwise without complaint today.   Past Medical History  Diagnosis Date  . Hypertension   . Hypopotassemia   . Atrial fibrillation     paroxysmal  . Dyslipidemia   . Cough syncope   . Gout   . Diabetes mellitus   . Hyperlipidemia    No past surgical history on file.  Current Outpatient Prescriptions  Medication Sig Dispense Refill  . allopurinol (ZYLOPRIM) 300 MG tablet Take 300 mg by mouth daily.        . colchicine 0.6 MG tablet Take 0.6 mg by mouth daily.        . cyclobenzaprine (FLEXERIL) 10 MG tablet Take 10 mg by mouth at bedtime as needed.        . dabigatran (PRADAXA) 150 MG CAPS Take 1 capsule (150 mg total) by mouth every 12 (twelve) hours.  60 capsule  10  . HYDROcodone-acetaminophen (NORCO) 5-325 MG per tablet Take 1 tablet by mouth every 6 (six) hours as needed.        . Phytosterol Esters-DHA-EPA (VAYAROL PO) Take 1,000 mg by mouth 2 (two) times daily.      . pravastatin (PRAVACHOL) 20 MG tablet Take 1 tablet by mouth daily.       No current facility-administered medications for this visit.    Physical Exam: Filed Vitals:   01/27/13 1101  BP: 126/78  Pulse: 76  Height: 5\' 9"  (1.753 m)  Weight: 182 lb (82.555 kg)    GEN- The patient is well appearing, alert and oriented x 3 today.   Head- normocephalic, atraumatic Eyes-  Sclera clear, conjunctiva pink Ears- hearing intact Oropharynx- clear Lungs- Clear to ausculation bilaterally, normal work of breathing Heart- Regular rate and rhythm, no murmurs, rubs or gallops, PMI not laterally displaced GI- soft, NT, ND, + BS Extremities- no  clubbing, cyanosis, or edema  ekg today reveals sinus rhythm  Assessment and Plan:  1. afib  Maintaining sinus rhythm, without symptomatic recurrence in over two years  Very difficult to know what to do with anticoagulation.  Though he carries a history of diabetes and htn (CHADS2VASC of 2), these are both well managed without medicine following weight loss.  He has also had only a single episode of afib several years ago. We had a long discussion today weighing options of stopping anticoagulation going forward.  As he is doing well on pradaxa, he would like to continue this presently.  Certainly, I think that our guidelines would support this decision.  2. HTN Stable No change required today  3. HL Lipids from primary care are review Lifestyle modification is encouraged  Return in 1year

## 2013-03-02 ENCOUNTER — Other Ambulatory Visit: Payer: Self-pay | Admitting: Internal Medicine

## 2013-08-30 ENCOUNTER — Other Ambulatory Visit: Payer: Self-pay | Admitting: Internal Medicine

## 2013-11-02 LAB — CBC AND DIFFERENTIAL
HEMATOCRIT: 44 % (ref 41–53)
Hemoglobin: 15.6 g/dL (ref 13.5–17.5)
NEUTROS ABS: 60 /uL
Platelets: 276 10*3/uL (ref 150–399)
WBC: 7.4 10*3/mL

## 2013-11-02 LAB — BASIC METABOLIC PANEL
BUN: 19 mg/dL (ref 4–21)
CREATININE: 1 mg/dL (ref <=1.3)
Glucose: 146 mg/dL
POTASSIUM: 4.7 mmol/L (ref 3.4–5.3)
Sodium: 140 mmol/L (ref 137–147)

## 2013-11-02 LAB — HEPATIC FUNCTION PANEL
ALK PHOS: 65 U/L (ref 25–125)
ALT: 29 U/L (ref 10–40)
AST: 24 U/L (ref 14–40)
Bilirubin, Total: 0.6 mg/dL

## 2013-11-02 LAB — LIPID PANEL
Cholesterol: 222 mg/dL — AB (ref 0–200)
HDL: 42 mg/dL (ref 35–70)
LDL Cholesterol: 132 mg/dL
LDl/HDL Ratio: 3.1
TRIGLYCERIDES: 241 mg/dL — AB (ref 40–160)

## 2013-11-02 LAB — TSH: TSH: 2.3 u[IU]/mL (ref <=5.90)

## 2013-11-02 LAB — PSA: PSA: 0.6

## 2014-01-27 ENCOUNTER — Encounter: Payer: Self-pay | Admitting: *Deleted

## 2014-01-31 ENCOUNTER — Encounter: Payer: Self-pay | Admitting: Internal Medicine

## 2014-01-31 ENCOUNTER — Ambulatory Visit (INDEPENDENT_AMBULATORY_CARE_PROVIDER_SITE_OTHER): Payer: 59 | Admitting: Internal Medicine

## 2014-01-31 VITALS — BP 140/82 | HR 70 | Ht 69.0 in | Wt 170.6 lb

## 2014-01-31 DIAGNOSIS — I480 Paroxysmal atrial fibrillation: Secondary | ICD-10-CM

## 2014-01-31 DIAGNOSIS — I10 Essential (primary) hypertension: Secondary | ICD-10-CM

## 2014-01-31 NOTE — Progress Notes (Signed)
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  Since last being seen in our clinic, the patient reports doing very well.  He is not aware of any afib. His heart rate did get up after sex 01/08/14.  This gradually returned to normal.  He has had no other issues related to his heart rate. Today, he denies symptoms of chest pain, shortness of breath,  lower extremity edema, dizziness, presyncope, or syncope.  Denies bleeding or issues with pradaxa.  The patient is otherwise without complaint today.   Past Medical History  Diagnosis Date  . Hypertension   . Hypopotassemia   . Atrial fibrillation     paroxysmal  . Dyslipidemia   . Cough syncope   . Gout   . Diabetes mellitus   . Hyperlipidemia    Past Surgical History  Procedure Laterality Date  . No surgical hx      Current Outpatient Prescriptions  Medication Sig Dispense Refill  . allopurinol (ZYLOPRIM) 300 MG tablet Take 600 mg by mouth daily.     . Cholecalciferol (VITAMIN D3) 2000 UNITS TABS Take 1 tablet by mouth daily.    Marland Kitchen CINNAMON PO Take 2,000 mg by mouth daily.    . metFORMIN (GLUCOPHAGE) 1000 MG tablet Take 1,000 mg by mouth daily.    . Omega-3 Fatty Acids (FISH OIL) 1000 MG CAPS Take 1 capsule by mouth daily.    Marland Kitchen PRADAXA 150 MG CAPS capsule TAKE ONE CAPSULE TWICE A DAY (Patient taking differently: TAKE ONE CAPSULE BY MOUTH TWICE A DAY) 60 capsule 5  . pravastatin (PRAVACHOL) 20 MG tablet Take 1 tablet by mouth daily.    . vitamin B-12 (CYANOCOBALAMIN) 1000 MCG tablet Take 1,000 mcg by mouth daily.     No current facility-administered medications for this visit.    Physical Exam: Filed Vitals:   01/31/14 0813  BP: 140/82  Pulse: 70  Height: 5\' 9"  (1.753 m)  Weight: 170 lb 9.6 oz (77.384 kg)    GEN- The patient is well appearing, alert and oriented x 3 today.   Head- normocephalic, atraumatic Eyes-  Sclera clear, conjunctiva pink Ears-  hearing intact Oropharynx- clear Lungs- Clear to ausculation bilaterally, normal work of breathing Heart- Regular rate and rhythm, no murmurs, rubs or gallops, PMI not laterally displaced GI- soft, NT, ND, + BS Extremities- no clubbing, cyanosis, or edema  ekg today reveals sinus rhythm  Assessment and Plan:  1. afib  Maintaining sinus rhythm   chads2vasc is 2.  Continue pradaxa  2. HTN He says his blood pressure is better controlled at home and does not wish to make changes today No change required today  3. HL Lipids from primary care are review Lifestyle modification is encouraged  Return in 1year

## 2014-01-31 NOTE — Patient Instructions (Signed)
Your physician wants you to follow-up in: 12 months with Dr Vallery Ridge will receive a reminder letter in the mail two months in advance. If you don't receive a letter, please call our office to schedule the follow-up appointment.  Your physician recommends that you continue on your current medications as directed. Please refer to the Current Medication list given to you today.

## 2014-02-24 IMAGING — CR DG LUMBAR SPINE 2-3V
1 series · 3 of 3 positions shown · non-contrast
Comparison: none

REASON FOR EXAM: pain
COMMENTS:

PROCEDURE:     KDR - KDXR LUMBAR SPINE AP AND LATERAL  - October 03, 2011 [DATE]
RESULT:     Comparison: 02/26/2010

[Series 1: ap · 0.17mm/px · 3 of 3 slices shown]
[im 1/3]
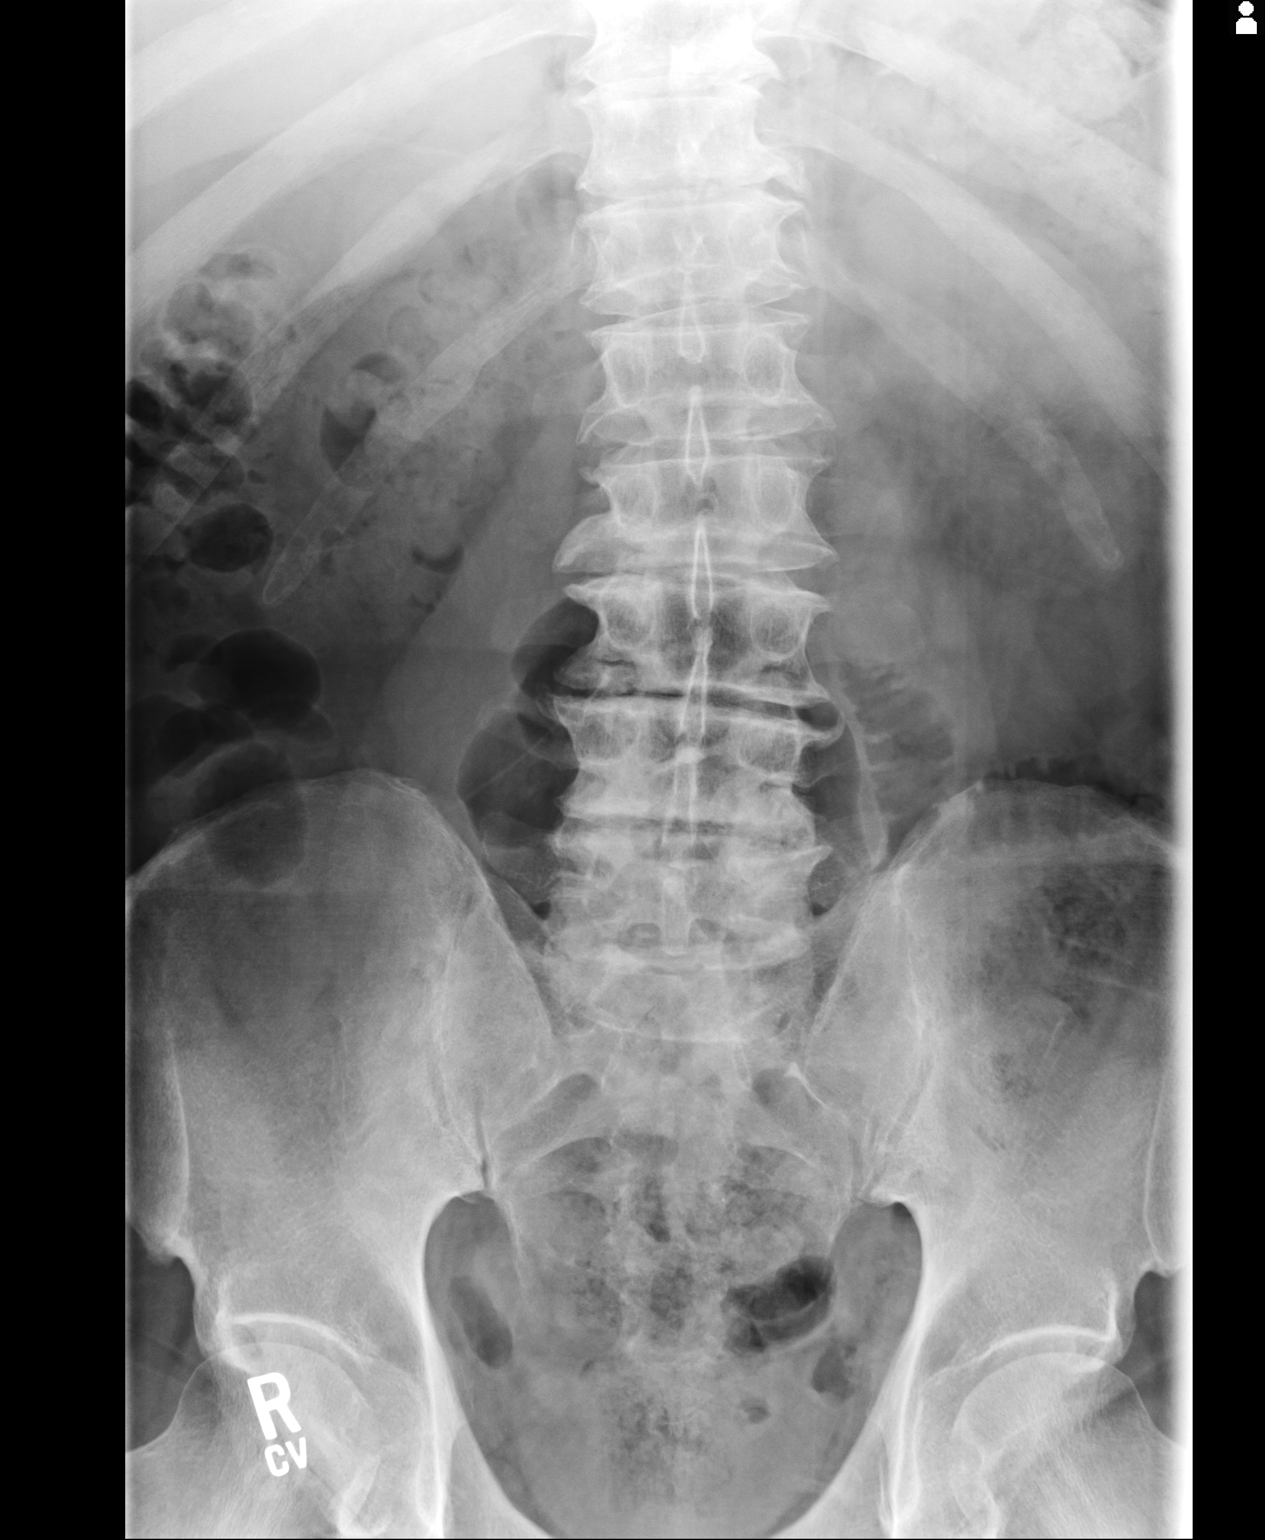
[im 2/3]
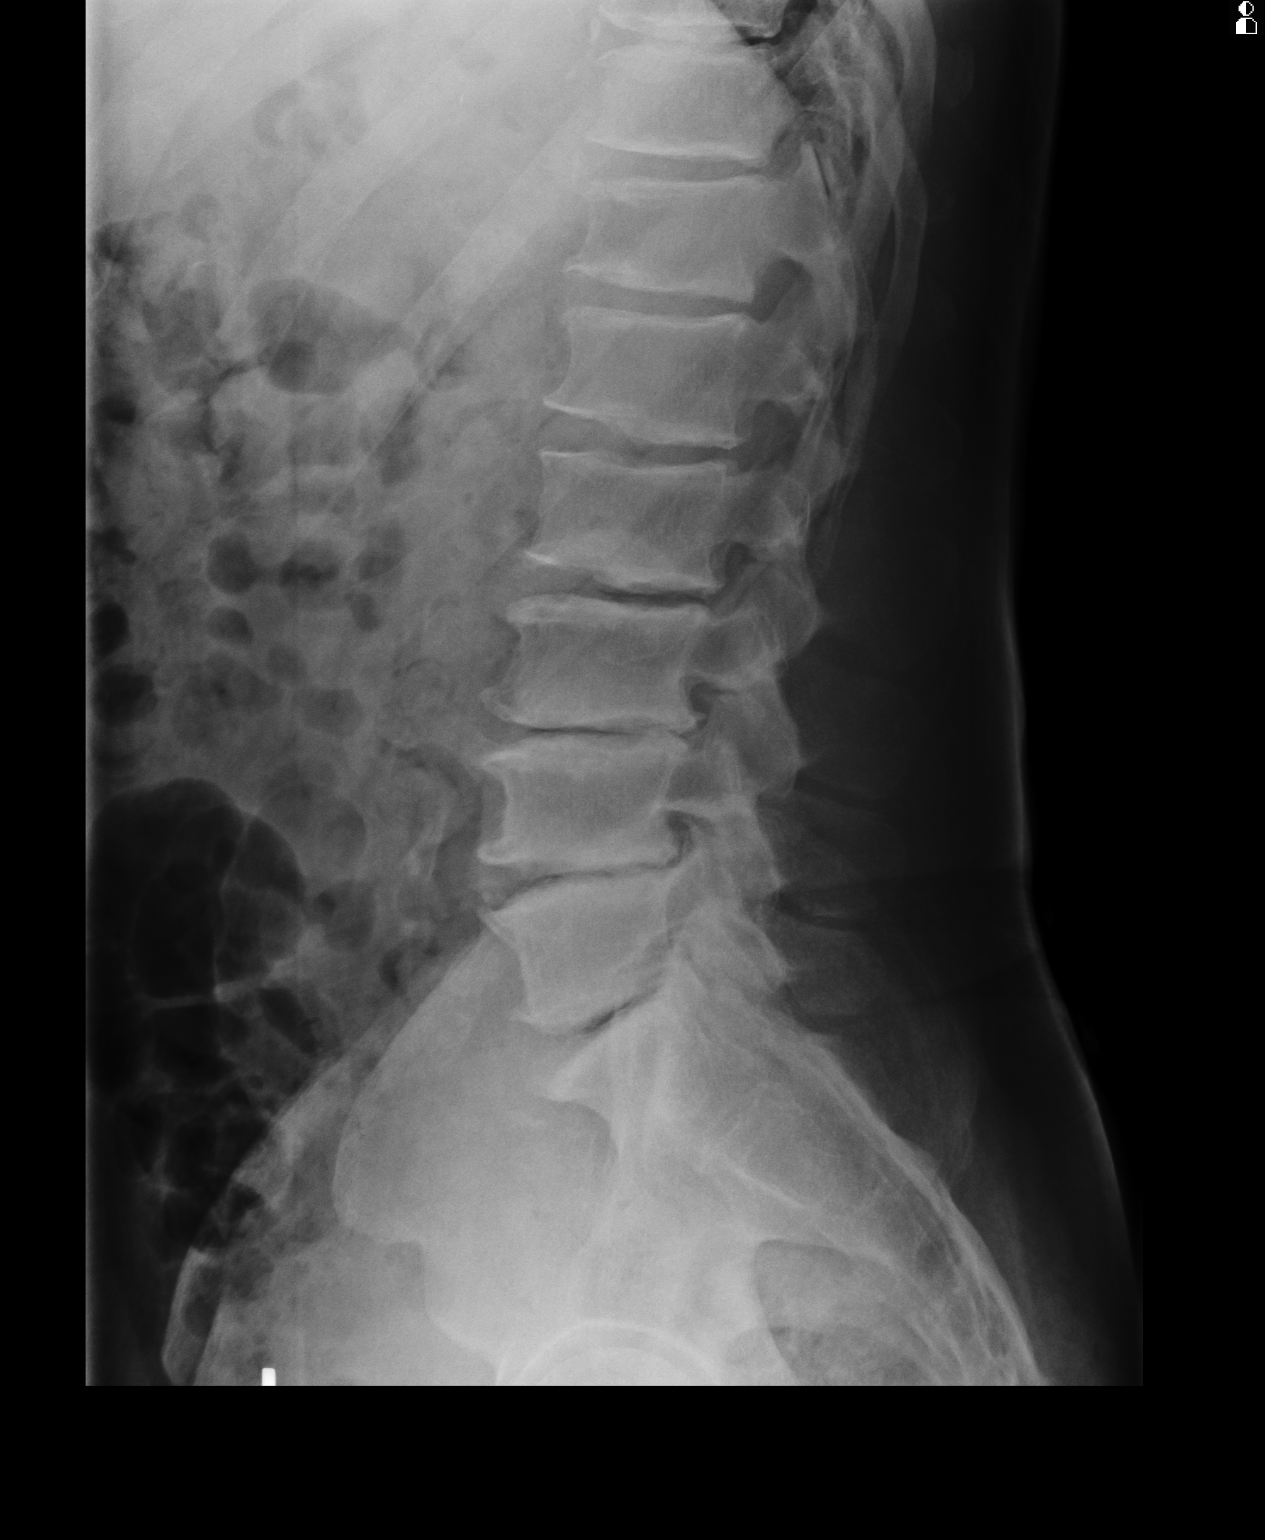
[im 3/3]
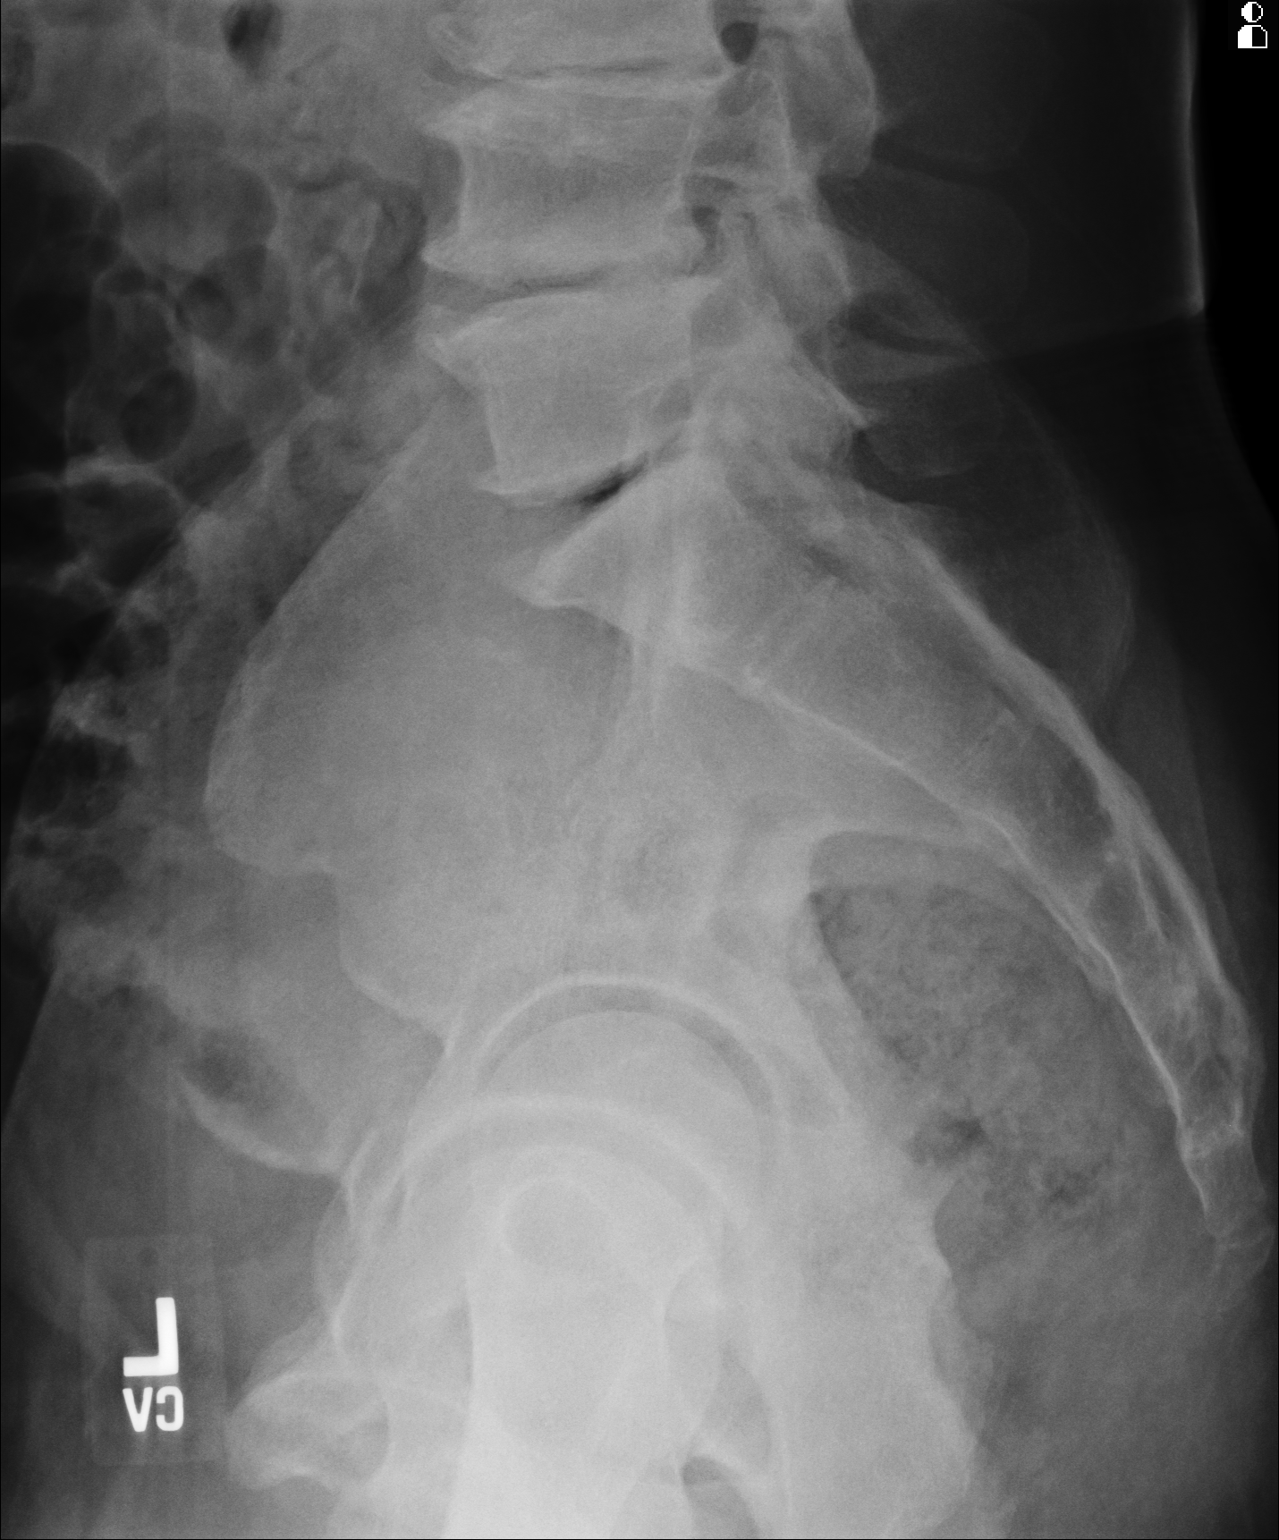

[3 of 3 positions shown; findings below may reference images not displayed]

FINDINGS: There are 5 lumbar type vertebral bodies. There is slight levocurvature of
the lumbar spine. Mild multilevel vertebral body height loss is similar to
prior studies. There is intervertebral disc height loss and osteophytosis
throughout the lumbar spine, similar to prior. This is greatest in the mid
and lower lumbar spine. There is normal alignment.
IMPRESSION: Multilevel degenerative disc disease throughout the lumbar spine, similar to
prior.

[REDACTED]

## 2014-02-25 ENCOUNTER — Other Ambulatory Visit: Payer: Self-pay | Admitting: Internal Medicine

## 2014-03-30 ENCOUNTER — Encounter: Payer: Self-pay | Admitting: Internal Medicine

## 2014-05-03 LAB — HEMOGLOBIN A1C: HEMOGLOBIN A1C: 7.6 % — AB (ref 4.0–6.0)

## 2014-07-04 DIAGNOSIS — M1990 Unspecified osteoarthritis, unspecified site: Secondary | ICD-10-CM | POA: Insufficient documentation

## 2014-07-04 DIAGNOSIS — J309 Allergic rhinitis, unspecified: Secondary | ICD-10-CM | POA: Insufficient documentation

## 2014-07-04 DIAGNOSIS — E785 Hyperlipidemia, unspecified: Secondary | ICD-10-CM | POA: Insufficient documentation

## 2014-07-04 DIAGNOSIS — R7309 Other abnormal glucose: Secondary | ICD-10-CM | POA: Insufficient documentation

## 2014-07-04 DIAGNOSIS — I7300 Raynaud's syndrome without gangrene: Secondary | ICD-10-CM | POA: Insufficient documentation

## 2014-07-04 DIAGNOSIS — E119 Type 2 diabetes mellitus without complications: Secondary | ICD-10-CM | POA: Insufficient documentation

## 2014-07-04 DIAGNOSIS — G8929 Other chronic pain: Secondary | ICD-10-CM | POA: Insufficient documentation

## 2014-07-04 DIAGNOSIS — M549 Dorsalgia, unspecified: Secondary | ICD-10-CM

## 2014-09-13 ENCOUNTER — Ambulatory Visit (INDEPENDENT_AMBULATORY_CARE_PROVIDER_SITE_OTHER): Payer: PPO | Admitting: Family Medicine

## 2014-09-13 ENCOUNTER — Encounter: Payer: Self-pay | Admitting: Family Medicine

## 2014-09-13 VITALS — BP 138/72 | HR 66 | Temp 97.8°F | Resp 14 | Ht 67.75 in | Wt 181.0 lb

## 2014-09-13 DIAGNOSIS — Z23 Encounter for immunization: Secondary | ICD-10-CM | POA: Diagnosis not present

## 2014-09-13 DIAGNOSIS — M109 Gout, unspecified: Secondary | ICD-10-CM

## 2014-09-13 DIAGNOSIS — M549 Dorsalgia, unspecified: Secondary | ICD-10-CM

## 2014-09-13 DIAGNOSIS — E1121 Type 2 diabetes mellitus with diabetic nephropathy: Secondary | ICD-10-CM | POA: Diagnosis not present

## 2014-09-13 DIAGNOSIS — M1000 Idiopathic gout, unspecified site: Secondary | ICD-10-CM

## 2014-09-13 DIAGNOSIS — I10 Essential (primary) hypertension: Secondary | ICD-10-CM

## 2014-09-13 DIAGNOSIS — G8929 Other chronic pain: Secondary | ICD-10-CM

## 2014-09-13 LAB — POCT UA - MICROALBUMIN: Microalbumin Ur, POC: 50 mg/L

## 2014-09-13 LAB — POCT GLYCOSYLATED HEMOGLOBIN (HGB A1C): Hemoglobin A1C: 6.6

## 2014-09-13 MED ORDER — HYDROCODONE-ACETAMINOPHEN 5-325 MG PO TABS: ORAL_TABLET | ORAL | Status: DC

## 2014-09-13 MED ORDER — LOSARTAN POTASSIUM 25 MG PO TABS: 25.0000 mg | ORAL_TABLET | Freq: Every day | ORAL | Status: DC

## 2014-09-13 NOTE — Progress Notes (Signed)
Patient ID: Benjamin [name], male   DOB: 02-25-1950, 64 y.o.   MRN: [id number]    Subjective:  HPI   Diabetes Mellitus Type II, Follow-up:   Lab Results  Component Value Date   HGBA1C 7.6* 05/03/2014   Last seen for diabetes 5 months ago.  Management changes included recomending diet and exercise and that possibly medication need to be added. He reports good compliance with treatment. Takes Metformin. He is not having side effects.  Current symptoms include none Home blood sugar records: fasting range: 140-180 and in the evening 140-160.  Episodes of hypoglycemia? no   Current Insulin Regimen:none Most Recent Eye Exam: 4-5 months ago or less. Weight trend: stable Prior visit with dietician: yes - at the lifestyle at [facility] about 5 years ago. Current diet: no specific diet just tries to watch what he eats. Current exercise: goes to the gym 4 days a week and plays golf 2 days a week.     Hypertension, follow-up:  BP Readings from Last 3 Encounters:  09/13/14 138/72  05/03/14 108/64  01/31/14 140/82    He was last seen for hypertension 5 months ago.  BP at that visit was 108/64. Management changes since that visit include none .He reports good compliance with treatment. He is not having side effects.  He is exercising. He is not adherent to low salt diet.   Outside blood pressures are around 120s/60s. He is experiencing none.        Lipid/Cholesterol, Follow-up:   Last seen for this 5 months ago.  Management changes since that visit include none.  Last Lipid Panel:    Component Value Date/Time   CHOL 222* 11/02/2013   TRIG 241* 11/02/2013   HDL 42 11/02/2013   CHOLHDL 6.0 01/06/2009 0630   VLDL 56* 01/06/2009 0630   LDLCALC 132 11/02/2013    He reports good compliance with treatment. He is taking Pravastatin.  He is not having side effects.   Wt Readings from Last 3 Encounters:  09/13/14 181 lb (82.101 kg)  05/03/14 182 lb (82.555 kg)  01/31/14  170 lb 9.6 oz (77.384 kg)     Gout follow up:   The patient reports no acute gout attacks since last clinic visit-none in 2 years. Attacks occur primarily in the left big toe typically.  Patient reports his chronic pain is stable, his joint stiffness is unchanged and his joint swelling is resolved.   Limitation on activities include none.   The patient is avoiding high purine foods and reports consuming rare amount of alcohol.   Back pain follow up chronic:  Takes Norco 1/2 tablet on Saturday and 1/2 tablet on Sunday if he plays golf only. Patient at that time also takes Flexeril.    Prior to Admission medications   Medication Sig Start Date End Date Taking? Authorizing Provider  allopurinol (ZYLOPRIM) 300 MG tablet Take 600 mg by mouth daily.     Historical Provider, MD  Brimonidine Tartrate 0.33 % GEL MIRVASO, 0.33% (External Gel) - Historical Medication  apply to affected area daily (0.33 %) Active    Historical Provider, MD  Cholecalciferol (VITAMIN D3) 2000 UNITS TABS Take 1 tablet by mouth daily.    Historical Provider, MD  CINNAMON PO Take 2,000 mg by mouth daily.    Historical Provider, MD  cyclobenzaprine (FLEXERIL) 10 MG tablet Take by mouth. 07/15/13   Historical Provider, MD  glucose blood test strip ONETOUCH VERIO (In Vitro Strip)  1 (one) Strip two times  daily for 0 days  Quantity: 100;  Refills: 12   Ordered :30-May-2014  Miguel Aschoff MD;  Started 30-May-2014 Active 05/30/14   Historical Provider, MD  HYDROcodone-acetaminophen (NORCO/VICODIN) 5-325 MG per tablet Take by mouth.    Historical Provider, MD  meloxicam (MOBIC) 15 MG tablet Take 15 mg by mouth daily as needed for pain.    Historical Provider, MD  metFORMIN (GLUCOPHAGE) 1000 MG tablet Take 1,000 mg by mouth daily.    Historical Provider, MD  Omega-3 Fatty Acids (FISH OIL) 1000 MG CAPS Take 1 capsule by mouth daily.    Historical Provider, MD  PRADAXA 150 MG CAPS capsule TAKE ONE CAPSULE TWICE A DAY 02/25/14   Thompson Grayer, MD  pravastatin (PRAVACHOL) 20 MG tablet Take 1 tablet by mouth daily. 12/29/12   Historical Provider, MD  vitamin B-12 (CYANOCOBALAMIN) 1000 MCG tablet Take 1,000 mcg by mouth daily.    Historical Provider, MD    Patient Active Problem List   Diagnosis Date Noted  . Allergic rhinitis 07/04/2014  . Arthritis 07/04/2014  . Back pain, chronic 07/04/2014  . Abnormal glucose tolerance test 07/04/2014  . HLD (hyperlipidemia) 07/04/2014  . Raynaud's syndrome 07/04/2014  . Diabetes mellitus, type 2 07/04/2014  . HYPERTENSION, BENIGN 02/20/2009  . HYPOPOTASSEMIA 01/31/2009  . UNSPECIFIED ACUTE PERICARDITIS 01/23/2009  . ATRIAL FIBRILLATION 01/23/2009  . COUGH SYNCOPE 01/23/2009  . DYSLIPIDEMIA 01/20/2009  . GOUT, UNSPECIFIED 01/20/2009    Past Medical History  Diagnosis Date  . Hypertension   . Hypopotassemia   . Atrial fibrillation     paroxysmal  . Dyslipidemia   . Cough syncope   . Gout   . Diabetes mellitus   . Hyperlipidemia     History   Social History  . Marital Status: Married    Spouse Name: Baldo Ash  . Number of Children: 2  . Years of Education: 16   Occupational History  . 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 Smoking status: Former Smoker    Types: Cigarettes    Quit date: 01/23/1971  . Smokeless tobacco: Never Used  . Alcohol Use: Yes     Comment: very occasionally drinks alcohol  . Drug Use: No  . Sexual Activity: Yes   Other Topics Concern  . Not on file   Social History Narrative    SOCIAL HISTORY:  He lives in [place] with his wife.  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  He has 2 adult sons.  He exercises as stated above.  No tobacco or illicit substance use.  He does enjoy occasional beer and vodka, mixed drink on the weekends.  He tries to follow a low-cholesterol diet.          FAMILY HISTORY:  Mother is alive, she has a pacemaker secondary to heart block, she also has a  stable abdominal aneurysm.  Father's health, interesting that he had an MI at age 67 and then apparently died in his sleep at age 32 that was felt to be secondary to MI.  He has 3 siblings who are alive and well.     No Known Allergies  Review of Systems  Constitutional: Negative for fever, chills and weight loss.  Respiratory: Negative for cough, hemoptysis, sputum production, shortness of breath and wheezing.   Cardiovascular: Negative for chest pain, palpitations, orthopnea, claudication and leg swelling.  Musculoskeletal: Positive for back pain and joint pain. Negative for myalgias, falls  and neck pain.  Skin: Negative for rash.  Neurological: Negative for dizziness, weakness and headaches.    Immunization History  Administered Date(s) Administered  . Pneumococcal Polysaccharide-23 02/06/2000, 06/07/2009  . Td 11/10/2003  . Tdap 10/10/2010  . Zoster 11/14/2010   Objective:  BP 138/72 mmHg  Pulse 66  Temp(Src) 97.8 F (36.6 C)  Resp 14  Ht 5' 7.75" (1.721 m)  Wt 181 lb (82.101 kg)  BMI 27.72 kg/m2  Physical Exam  Constitutional: He is oriented to person, place, and time and well-developed, well-nourished, and in no distress.  HENT:  Head: Normocephalic.  Eyes: Conjunctivae are normal. Pupils are equal, round, and reactive to light.  Neck: Normal range of motion.  Cardiovascular: Normal rate, regular rhythm, normal heart sounds and intact distal pulses.  Exam reveals no gallop.   No murmur heard. Pulmonary/Chest: Effort normal and breath sounds normal. No respiratory distress. He has no wheezes. He has no rales.  Musculoskeletal: Normal range of motion. He exhibits no edema or tenderness.  Neurological: He is alert and oriented to person, place, and time. Gait normal.  Psychiatric: Mood, memory, affect and judgment normal.    Lab Results  Component Value Date   WBC 7.4 11/02/2013   HGB 15.6 11/02/2013   HCT 44 11/02/2013   PLT 276 11/02/2013   GLUCOSE 123*  01/31/2009   CHOL 222* 11/02/2013   TRIG 241* 11/02/2013   HDL 42 11/02/2013   LDLCALC 132 11/02/2013   TSH 2.30 11/02/2013   PSA 0.6 11/02/2013   INR 1.06 01/07/2009   HGBA1C 7.6* 05/03/2014    CMP     Component Value Date/Time   NA 140 11/02/2013   NA 143 01/31/2009 0842   K 4.7 11/02/2013   CL 110 01/31/2009 0842   CO2 24 01/31/2009 0842   GLUCOSE 123* 01/31/2009 0842   BUN 19 11/02/2013   BUN 19 01/31/2009 0842   CREATININE 1.0 11/02/2013   CREATININE 1.3 01/31/2009 0842   CALCIUM 9.0 01/31/2009 0842   PROT 7.8 01/05/2009 1440   ALBUMIN 4.0 01/05/2009 1440   AST 24 11/02/2013   ALT 29 11/02/2013   ALKPHOS 65 11/02/2013   BILITOT 0.8 01/05/2009 1440   GFRNONAA 59.93 01/31/2009 0842   GFRAA  01/05/2009 1440    >60        The eGFR has been calculated using the MDRD equation. This calculation has not been validated in all clinical situations. eGFR's persistently <60 mL/min signify possible Chronic Kidney Disease.    Assessment and Plan :  1. HYPERTENSION, BENIGN Borderline today but patient gets better readings at home. Will follow. Advised to watch for orthostasis and to stay hydrated on new blood pressure medicine  2. Diabetic nephropathy associated with type 2 diabetes mellitus A1C 6.6 better. Continue working on habits. Urine microalbumin-50-will start Losartan 25 mg to protect kidneys. May need to increase the dose to 50. - POCT HgB A1C - POCT UA - Microalbumin - losartan (COZAAR) 25 MG tablet; Take 1 tablet (25 mg total) by mouth daily.  Dispense: 30 tablet; Refill: 12  3. Back pain, chronic Stable. Refill given. - HYDROcodone-acetaminophen (NORCO/VICODIN) 5-325 MG per tablet; 1/2 to 1 twice daily as needed  Dispense: 60 tablet; Refill: 0  4. Gout of big toe Stable-no flare up so far in 2 years. Follow.  5. Need for pneumococcal vaccination Given today. - Pneumococcal conjugate vaccine 13-valent IM; Standing - Pneumococcal conjugate vaccine  13-valent IM 6. History of atrial fibrillation  Patient was seen and examined by Dr. Eulas Post and note was scribed by Theressa Millard, RMA.    Miguel Aschoff MD [facility] 09/13/2014 8:40 AM

## 2014-11-16 ENCOUNTER — Other Ambulatory Visit: Payer: Self-pay

## 2014-11-16 MED ORDER — ALLOPURINOL 300 MG PO TABS: 300.0000 mg | ORAL_TABLET | Freq: Two times a day (BID) | ORAL | Status: DC

## 2014-12-24 ENCOUNTER — Other Ambulatory Visit: Payer: Self-pay | Admitting: Family Medicine

## 2015-01-16 ENCOUNTER — Ambulatory Visit: Payer: PPO | Admitting: Family Medicine

## 2015-01-18 ENCOUNTER — Ambulatory Visit: Payer: PPO | Admitting: Family Medicine

## 2015-01-30 ENCOUNTER — Ambulatory Visit (INDEPENDENT_AMBULATORY_CARE_PROVIDER_SITE_OTHER): Payer: PPO | Admitting: Family Medicine

## 2015-01-30 VITALS — BP 128/70 | HR 62 | Temp 98.2°F | Resp 16 | Wt 183.0 lb

## 2015-01-30 DIAGNOSIS — I10 Essential (primary) hypertension: Secondary | ICD-10-CM

## 2015-01-30 DIAGNOSIS — E1121 Type 2 diabetes mellitus with diabetic nephropathy: Secondary | ICD-10-CM | POA: Diagnosis not present

## 2015-01-30 DIAGNOSIS — E7800 Pure hypercholesterolemia, unspecified: Secondary | ICD-10-CM | POA: Diagnosis not present

## 2015-01-30 LAB — POCT GLYCOSYLATED HEMOGLOBIN (HGB A1C): HEMOGLOBIN A1C: 7.1

## 2015-01-30 NOTE — Progress Notes (Signed)
Patient ID: Benjamin [name], male   DOB: 30-Nov-1950, 64 y.o.   MRN: [id number]   Benjamin [name]  MRN: [id number] DOB: 06-24-1950  Subjective:  HPI   1. Diabetic nephropathy associated with type 2 diabetes mellitus [name]) The patient is a 64 year old male who presents for follow up of his diabetes.  His last visit was on 09/13/14 and no management changes were made at that time.  His A1C on that day was 6.6.  Patient checks his glucose at home occasionally and states it will run between 140-200 regardless of the time he checks.  2. HYPERTENSION, BENIGN The patient is also here to recheck on his hypertension.  His last BP reading in the office was 138/72.    Last full labs were done in March 2016.   Patient Active Problem List   Diagnosis Date Noted  . Allergic rhinitis 07/04/2014  . Arthritis 07/04/2014  . Back pain, chronic 07/04/2014  . Abnormal glucose tolerance test 07/04/2014  . HLD (hyperlipidemia) 07/04/2014  . Raynaud's syndrome 07/04/2014  . Diabetes mellitus, type 2 ([place]) 07/04/2014  . HYPERTENSION, BENIGN 02/20/2009  . HYPOPOTASSEMIA 01/31/2009  . UNSPECIFIED ACUTE PERICARDITIS 01/23/2009  . ATRIAL FIBRILLATION 01/23/2009  . COUGH SYNCOPE 01/23/2009  . DYSLIPIDEMIA 01/20/2009  . Gout 01/20/2009    Past Medical History  Diagnosis Date  . Hypertension   . Hypopotassemia   . Atrial fibrillation     paroxysmal  . Dyslipidemia   . Cough syncope   . Gout   . Diabetes mellitus   . Hyperlipidemia     Social History   Social History  . Marital Status: Married    Spouse Name: Baldo Ash  . Number of Children: 2  . Years of Education: 16   Occupational History  . 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 Smoking status: Former Smoker    Types: Cigarettes    Quit date: 01/23/1971  . Smokeless tobacco: Never Used  . Alcohol Use: Yes     Comment: very occasionally drinks alcohol  . Drug Use: No  . Sexual Activity: Yes   Other  Topics Concern  . Not on file   Social History Narrative    SOCIAL HISTORY:  He lives in [place] with his wife.  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  He exercises as stated above.  No tobacco or illicit substance use.  He does enjoy occasional beer and vodka, mixed drink on the weekends.  He tries to follow a low-cholesterol diet.          FAMILY HISTORY:  Mother is alive, she has a pacemaker secondary to heart block, she also has a stable abdominal aneurysm.  Father's health, interesting that he had an MI at age 1 and then apparently died in his sleep at age 60 that was felt to be secondary to MI.  He has 3 siblings who are alive and well.     Outpatient Prescriptions Prior to Visit  Medication Sig Dispense Refill  . allopurinol (ZYLOPRIM) 300 MG tablet Take 1 tablet (300 mg total) by mouth 2 (two) times daily. 180 tablet 12  . Cholecalciferol (VITAMIN D3) 2000 UNITS TABS Take 1 tablet by mouth daily.    Marland Kitchen CINNAMON PO Take 2,000 mg by mouth daily.    . cyclobenzaprine (FLEXERIL) 10 MG tablet Take by mouth.    Marland Kitchen glucose blood test strip  ONETOUCH VERIO (In Vitro Strip)  1 (one) Strip two times daily for 0 days  Quantity: 100;  Refills: 12   Ordered :30-May-2014  Miguel Aschoff MD;  Started 30-May-2014 Active    . HYDROcodone-acetaminophen (NORCO/VICODIN) 5-325 MG per tablet 1/2 to 1 twice daily as needed 60 tablet 0  . losartan (COZAAR) 25 MG tablet Take 1 tablet (25 mg total) by mouth daily. 30 tablet 12  . meloxicam (MOBIC) 15 MG tablet Take 15 mg by mouth daily as needed for pain.    . metFORMIN (GLUCOPHAGE) 1000 MG tablet Take 1,000 mg by mouth daily.    . Omega-3 Fatty Acids (FISH OIL) 1000 MG CAPS Take 1 capsule by mouth daily.    Marland Kitchen PRADAXA 150 MG CAPS capsule TAKE ONE CAPSULE TWICE A DAY 60 capsule 11  . pravastatin (PRAVACHOL) 20 MG tablet TAKE ONE TABLET AT BEDTIME 30 tablet 12  . vitamin B-12 (CYANOCOBALAMIN) 1000 MCG tablet Take 1,000  mcg by mouth daily.     No facility-administered medications prior to visit.    No Known Allergies  Review of Systems  Constitutional: Negative for fever, chills and malaise/fatigue.  HENT: Negative.   Eyes: Negative.   Respiratory: Negative for cough, hemoptysis, sputum production, shortness of breath and wheezing.   Cardiovascular: Negative for chest pain, palpitations, orthopnea and leg swelling.  Gastrointestinal: Negative.   Genitourinary: Negative.   Skin: Negative.   Neurological: Negative for weakness.  Endo/Heme/Allergies: Negative for polydipsia.  Psychiatric/Behavioral: Negative.    Objective:  BP 128/70 mmHg  Pulse 62  Temp(Src) 98.2 F (36.8 C) (Oral)  Resp 16  Wt 183 lb (83.008 kg)  Physical Exam  Constitutional: He is oriented to person, place, and time and well-developed, well-nourished, and in no distress.  HENT:  Head: Normocephalic and atraumatic.  Right Ear: External ear normal.  Left Ear: External ear normal.  Nose: Nose normal.  Eyes: Conjunctivae are normal. Pupils are equal, round, and reactive to light.  Neck: Normal range of motion. Neck supple.  Cardiovascular: Normal rate, regular rhythm and normal heart sounds.   Pulmonary/Chest: Effort normal and breath sounds normal.  Abdominal: Soft.  Musculoskeletal: Normal range of motion.  Neurological: He is alert and oriented to person, place, and time. Gait normal.  Skin: Skin is warm and dry.  Psychiatric: Mood, memory, affect and judgment normal.    Assessment and Plan :  Diabetic nephropathy associated with type 2 diabetes mellitus ([name]) - Plan: POCT glycosylated hemoglobin (Hb A1C)  HYPERTENSION, BENIGN  A1c is 7.1 today. Last was 6.6. History of atrial fibrillation Patient doing well presently. Gout Presently controlled Hyperlipidemia Hypertension I have done the exam and reviewed the above chart and it is accurate to the best of my knowledge.   Miguel Aschoff MD [facility] 01/30/2015 4:08 PM

## 2015-02-03 LAB — LIPID PANEL WITH LDL/HDL RATIO
CHOLESTEROL TOTAL: 192 mg/dL (ref 100–199)
HDL: 38 mg/dL — AB (ref >39)
LDL Calculated: 116 mg/dL — ABNORMAL HIGH (ref 0–99)
LDL/HDL RATIO: 3.1 ratio (ref 0.0–3.6)
TRIGLYCERIDES: 190 mg/dL — AB (ref 0–149)
VLDL Cholesterol Cal: 38 mg/dL (ref 5–40)

## 2015-02-16 ENCOUNTER — Encounter: Payer: Self-pay | Admitting: Emergency Medicine

## 2015-02-20 ENCOUNTER — Other Ambulatory Visit: Payer: Self-pay | Admitting: Internal Medicine

## 2015-02-23 DIAGNOSIS — M9905 Segmental and somatic dysfunction of pelvic region: Secondary | ICD-10-CM | POA: Diagnosis not present

## 2015-02-23 DIAGNOSIS — M9903 Segmental and somatic dysfunction of lumbar region: Secondary | ICD-10-CM | POA: Diagnosis not present

## 2015-02-23 DIAGNOSIS — M5431 Sciatica, right side: Secondary | ICD-10-CM | POA: Diagnosis not present

## 2015-02-23 DIAGNOSIS — M5416 Radiculopathy, lumbar region: Secondary | ICD-10-CM | POA: Diagnosis not present

## 2015-03-08 ENCOUNTER — Encounter: Payer: Self-pay | Admitting: Internal Medicine

## 2015-03-08 ENCOUNTER — Ambulatory Visit (INDEPENDENT_AMBULATORY_CARE_PROVIDER_SITE_OTHER): Payer: PPO | Admitting: Internal Medicine

## 2015-03-08 VITALS — BP 124/80 | HR 72 | Ht 69.0 in | Wt 185.0 lb

## 2015-03-08 DIAGNOSIS — I10 Essential (primary) hypertension: Secondary | ICD-10-CM | POA: Diagnosis not present

## 2015-03-08 DIAGNOSIS — N528 Other male erectile dysfunction: Secondary | ICD-10-CM | POA: Diagnosis not present

## 2015-03-08 DIAGNOSIS — I480 Paroxysmal atrial fibrillation: Secondary | ICD-10-CM | POA: Diagnosis not present

## 2015-03-08 DIAGNOSIS — N521 Erectile dysfunction due to diseases classified elsewhere: Secondary | ICD-10-CM

## 2015-03-08 DIAGNOSIS — E785 Hyperlipidemia, unspecified: Secondary | ICD-10-CM | POA: Diagnosis not present

## 2015-03-08 DIAGNOSIS — N529 Male erectile dysfunction, unspecified: Secondary | ICD-10-CM | POA: Insufficient documentation

## 2015-03-08 MED ORDER — SILDENAFIL CITRATE 100 MG PO TABS: 100.0000 mg | ORAL_TABLET | Freq: Every day | ORAL | Status: DC | PRN

## 2015-03-08 MED ORDER — ROSUVASTATIN CALCIUM 10 MG PO TABS: 10.0000 mg | ORAL_TABLET | Freq: Every day | ORAL | Status: DC

## 2015-03-08 NOTE — Progress Notes (Signed)
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  Since last being seen in our clinic, the patient reports doing very well.  He is not aware of any afib. He remains active and continues to exercise more than 3 days per week.  He is working with Dr Rosanna Randy on diabetes.  He reports having some difficulty with erectile function recently which is new.  Today, he denies symptoms of chest pain, shortness of breath,  lower extremity edema, dizziness, presyncope, or syncope.  Denies bleeding or issues with pradaxa.  The patient is otherwise without complaint today.   Past Medical History  Diagnosis Date  . Hypertension   . Hypopotassemia   . Atrial fibrillation (HCC)     paroxysmal  . Dyslipidemia   . Cough syncope   . Gout   . Diabetes mellitus   . Hyperlipidemia    Past Surgical History  Procedure Laterality Date  . Tonsillectomy and adenoidectomy      Current Outpatient Prescriptions  Medication Sig Dispense Refill  . allopurinol (ZYLOPRIM) 300 MG tablet Take 1 tablet (300 mg total) by mouth 2 (two) times daily. 180 tablet 12  . Cholecalciferol (VITAMIN D3) 2000 UNITS TABS Take 1 tablet by mouth daily.    Marland Kitchen CINNAMON PO Take 2,000 mg by mouth daily.    . cyclobenzaprine (FLEXERIL) 10 MG tablet Take 10 mg by mouth as directed.     . dabigatran (PRADAXA) 150 MG CAPS capsule Take 1 capsule (150 mg total) by mouth 2 (two) times daily. 60 capsule 11  . glucose blood test strip ONETOUCH VERIO (In Vitro Strip)  1 (one) Strip two times daily for 0 days  Quantity: 100;  Refills: 12   Ordered :30-May-2014  Miguel Aschoff MD;  Started 30-May-2014 Active    . HYDROcodone-acetaminophen (NORCO/VICODIN) 5-325 MG tablet Take 0.5-1 tablets by mouth 2 (two) times daily as needed (pain).    Marland Kitchen losartan (COZAAR) 25 MG tablet Take 1 tablet (25 mg total) by mouth daily. 30 tablet 12  . meloxicam (MOBIC) 15 MG tablet Take 15 mg by mouth daily  as needed for pain.    . metFORMIN (GLUCOPHAGE) 1000 MG tablet Take 1,000 mg by mouth daily.    . Omega-3 Fatty Acids (FISH OIL) 1000 MG CAPS Take 1 capsule by mouth daily.    . vitamin B-12 (CYANOCOBALAMIN) 1000 MCG tablet Take 1,000 mcg by mouth daily.    . rosuvastatin (CRESTOR) 10 MG tablet Take 1 tablet (10 mg total) by mouth daily. 90 tablet 3  . sildenafil (VIAGRA) 100 MG tablet Take 1 tablet (100 mg total) by mouth daily as needed for erectile dysfunction. 10 tablet 1   No current facility-administered medications for this visit.    Physical Exam: Filed Vitals:   03/08/15 0919  BP: 124/80  Pulse: 72  Height: 5\' 9"  (1.753 m)  Weight: 185 lb (83.915 kg)    GEN- The patient is well appearing, alert and oriented x 3 today.   Head- normocephalic, atraumatic Eyes-  Sclera clear, conjunctiva pink Ears- hearing intact Oropharynx- clear Lungs- Clear to ausculation bilaterally, normal work of breathing Heart- Regular rate and rhythm, no murmurs, rubs or gallops, PMI not laterally displaced GI- soft, NT, ND, + BS Extremities- no clubbing, cyanosis, or edema  ekg today reveals sinus rhythm, normal ekg  Assessment and Plan:  1. afib  Maintaining sinus rhythm  chads2vasc is 3.  Continue pradaxa  2. HTN Stable No change required today  3. HL Lipids from primary care are review Given LDL > 100, will stop Pravachol and start Crestor 10mg  daily I have spoken with Dr Rosanna Randy today.  Pt to have repeat lipids with PCP in 4 weeks  4. Erectile dysfunction Will try viagra Follow-up with primary care if not improved  Return in 1year to see me unless problems arise in the interim  Thompson Grayer MD, [facility] 03/08/2015 9:49 AM

## 2015-03-08 NOTE — Patient Instructions (Addendum)
Medication Instructions:  Your physician has recommended you make the following change in your medication:  1) Stop Pravastatin 2) Start  Crestor 10 mg daily 3) Viagra 100mg  as needed   Labwork: None ordered   Testing/Procedures: None ordered   Follow-Up: Your physician wants you to follow-up in: 12 months with Dr Vallery Ridge will receive a reminder letter in the mail two months in advance. If you don't receive a letter, please call our office to schedule the follow-up appointment.  Move appointment with Dr Rosanna Randy up to next month    Any Other Special Instructions Will Be Listed Below (If Applicable).     If you need a refill on your cardiac medications before your next appointment, please call your pharmacy.

## 2015-03-09 ENCOUNTER — Other Ambulatory Visit: Payer: Self-pay

## 2015-03-09 MED ORDER — SILDENAFIL CITRATE 20 MG PO TABS: ORAL_TABLET | ORAL | Status: DC

## 2015-03-10 ENCOUNTER — Telehealth: Payer: Self-pay | Admitting: Internal Medicine

## 2015-03-10 NOTE — Telephone Encounter (Signed)
Called patient back and he has checked into the other OAC's.    1. Pradaxa is 85/month 2. Xarelto and Eliquis are both 45/month or 90/88months   Wants to know which drug Dr Rayann Heman wants him to switch to.  I let him know I would forward to him and call him back once he responds

## 2015-03-10 NOTE — Telephone Encounter (Signed)
New message     Calling to give the nurse info about other blood thinners his ins will cover.  The copay on his pradaxa is very high.  Please call

## 2015-03-13 ENCOUNTER — Other Ambulatory Visit: Payer: Self-pay | Admitting: *Deleted

## 2015-03-13 MED ORDER — RIVAROXABAN 20 MG PO TABS: 20.0000 mg | ORAL_TABLET | Freq: Every day | ORAL | Status: DC

## 2015-03-13 NOTE — Telephone Encounter (Signed)
Stop pradaxa and start xarelto 20mg  qday (to be taken with evening meal)

## 2015-03-13 NOTE — Telephone Encounter (Signed)
Patient aware and have called in the medication

## 2015-03-23 DIAGNOSIS — M9905 Segmental and somatic dysfunction of pelvic region: Secondary | ICD-10-CM | POA: Diagnosis not present

## 2015-03-23 DIAGNOSIS — M5416 Radiculopathy, lumbar region: Secondary | ICD-10-CM | POA: Diagnosis not present

## 2015-03-23 DIAGNOSIS — M9903 Segmental and somatic dysfunction of lumbar region: Secondary | ICD-10-CM | POA: Diagnosis not present

## 2015-03-23 DIAGNOSIS — M5431 Sciatica, right side: Secondary | ICD-10-CM | POA: Diagnosis not present

## 2015-04-10 ENCOUNTER — Ambulatory Visit (INDEPENDENT_AMBULATORY_CARE_PROVIDER_SITE_OTHER): Payer: PPO | Admitting: Family Medicine

## 2015-04-10 ENCOUNTER — Encounter: Payer: Self-pay | Admitting: Family Medicine

## 2015-04-10 VITALS — BP 116/58 | HR 54 | Temp 97.5°F | Resp 16 | Wt 183.0 lb

## 2015-04-10 DIAGNOSIS — E785 Hyperlipidemia, unspecified: Secondary | ICD-10-CM | POA: Diagnosis not present

## 2015-04-10 DIAGNOSIS — M109 Gout, unspecified: Secondary | ICD-10-CM | POA: Diagnosis not present

## 2015-04-10 NOTE — Progress Notes (Signed)
Patient ID: AVARY PITSENBARGER, male   DOB: Mar 22, 1950, 65 y.o.   MRN: [id number]    Subjective:  HPI  Lipid/Cholesterol, Follow-up:   Last seen for this1 months ago.  Management changes since that visit include Stopped Pravachol and started Crestor. . Last Lipid Panel:    Component Value Date/Time   CHOL 192 02/02/2015 0830   CHOL 222* 11/02/2013   TRIG 190* 02/02/2015 0830   HDL 38* 02/02/2015 0830   HDL 42 11/02/2013   CHOLHDL 6.0 01/06/2009 0630   VLDL 56* 01/06/2009 0630   LDLCALC 116* 02/02/2015 0830   LDLCALC 132 11/02/2013     He reports good compliance with treatment. He is not having side effects.  Weight trend: stable Current exercise: Gym 3-5 days a week for about 55 minutes and golf on the weekends.  Wt Readings from Last 3 Encounters:  04/10/15 183 lb (83.008 kg)  03/08/15 185 lb (83.915 kg)  01/30/15 183 lb (83.008 kg)    -------------------------------------------------------------------  Pt reports that he is doing well on Crestor. Per pt it was switched because the Pravachol was not getting his levels low enough. He did not have side effects with it. This plan was discussed with cardiology on 03/10/15.  Prior to Admission medications   Medication Sig Start Date End Date Taking? Authorizing Provider  allopurinol (ZYLOPRIM) 300 MG tablet Take 1 tablet (300 mg total) by mouth 2 (two) times daily. 11/16/14  Yes Richard Maceo Pro., MD  Cholecalciferol (VITAMIN D3) 2000 UNITS TABS Take 1 tablet by mouth daily.   Yes Historical Provider, MD  CINNAMON PO Take 2,000 mg by mouth daily.   Yes Historical Provider, MD  cyclobenzaprine (FLEXERIL) 10 MG tablet Take 10 mg by mouth as directed.  07/15/13  Yes Historical Provider, MD  glucose blood test strip ONETOUCH VERIO (In Vitro Strip)  1 (one) Strip two times daily for 0 days  Quantity: 100;  Refills: 12   Ordered :30-May-2014  Miguel Aschoff MD;  Started 30-May-2014 Active 05/30/14  Yes Historical Provider, MD    HYDROcodone-acetaminophen (NORCO/VICODIN) 5-325 MG tablet Take 0.5-1 tablets by mouth 2 (two) times daily as needed (pain).   Yes Historical Provider, MD  losartan (COZAAR) 25 MG tablet Take 1 tablet (25 mg total) by mouth daily. 09/13/14  Yes Richard Maceo Pro., MD  meloxicam (MOBIC) 15 MG tablet Take 15 mg by mouth daily as needed for pain.   Yes Historical Provider, MD  metFORMIN (GLUCOPHAGE) 1000 MG tablet Take 1,000 mg by mouth daily.   Yes Historical Provider, MD  Omega-3 Fatty Acids (FISH OIL) 1000 MG CAPS Take 1 capsule by mouth daily.   Yes Historical Provider, MD  rivaroxaban (XARELTO) 20 MG TABS tablet Take 1 tablet (20 mg total) by mouth daily with supper. 03/13/15  Yes Thompson Grayer, MD  rosuvastatin (CRESTOR) 10 MG tablet Take 1 tablet (10 mg total) by mouth daily. 03/08/15  Yes Thompson Grayer, MD  sildenafil (REVATIO) 20 MG tablet 2 to 4 tablets daily as needed 03/09/15  Yes Richard Maceo Pro., MD  vitamin B-12 (CYANOCOBALAMIN) 1000 MCG tablet Take 1,000 mcg by mouth daily.   Yes Historical Provider, MD    Patient Active Problem List   Diagnosis Date Noted  . Erectile dysfunction 03/08/2015  . Allergic rhinitis 07/04/2014  . Arthritis 07/04/2014  . Back pain, chronic 07/04/2014  . Abnormal glucose tolerance test 07/04/2014  . HLD (hyperlipidemia) 07/04/2014  . Raynaud's syndrome 07/04/2014  . Diabetes mellitus,  type 2 ([place]) 07/04/2014  . HYPERTENSION, BENIGN 02/20/2009  . HYPOPOTASSEMIA 01/31/2009  . UNSPECIFIED ACUTE PERICARDITIS 01/23/2009  . ATRIAL FIBRILLATION 01/23/2009  . COUGH SYNCOPE 01/23/2009  . DYSLIPIDEMIA 01/20/2009  . Gout 01/20/2009    Past Medical History  Diagnosis Date  . Hypertension   . Hypopotassemia   . Atrial fibrillation (HCC)     paroxysmal  . Dyslipidemia   . Cough syncope   . Gout   . Diabetes mellitus   . Hyperlipidemia     Social History   Social History  . Marital Status: Married    Spouse Name: Baldo Ash  . Number of  Children: 2  . Years of Education: 16   Occupational History  . industrial paper products in [place]  . Smoking status: Former Smoker    Types: Cigarettes    Quit date: 01/23/1971  . Smokeless tobacco: Never Used  . Alcohol Use: Yes     Comment: very occasionally drinks alcohol  . Drug Use: No  . Sexual Activity: Yes   Other Topics Concern  . Not on file   Social History Narrative    SOCIAL HISTORY:  He lives in [place] with his wife.  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  He has 2 adult sons.  He exercises as stated above.  No tobacco or illicit substance use.  He does enjoy occasional beer and vodka, mixed drink on the weekends.  He tries to follow a low-cholesterol diet.          FAMILY HISTORY:  Mother is alive, she has a pacemaker secondary to heart block, she also has a stable abdominal aneurysm.  Father's health, interesting that he had an MI at age 65 and then apparently died in his sleep at age 20 that was felt to be secondary to MI.  He has 3 siblings who are alive and well.     No Known Allergies  Review of Systems  Constitutional: Negative.   HENT: Negative.   Eyes: Negative.   Respiratory: Negative.   Cardiovascular: Negative.   Gastrointestinal: Negative.   Genitourinary: Negative.   Musculoskeletal: Negative.   Skin: Negative.   Neurological: Negative.   Endo/Heme/Allergies: Negative.   Psychiatric/Behavioral: Negative.     Immunization History  Administered Date(s) Administered  . Influenza-Unspecified 11/12/2014  . Pneumococcal Conjugate-13 09/13/2014  . Pneumococcal Polysaccharide-23 02/06/2000, 06/07/2009  . Td 11/10/2003  . Tdap 10/10/2010  . Zoster 11/14/2010   Objective:  BP 116/58 mmHg  Pulse 54  Temp(Src) 97.5 F (36.4 C) (Oral)  Resp 16  Wt 183 lb (83.008 kg)  Physical Exam  Constitutional: He is oriented to person, place, and time and well-developed, well-nourished, and in no  distress.  HENT:  Head: Normocephalic and atraumatic.  Right Ear: External ear normal.  Left Ear: External ear normal.  Nose: Nose normal.  Eyes: Conjunctivae and EOM are normal. Pupils are equal, round, and reactive to light.  Neck: Normal range of motion. Neck supple.  Cardiovascular: Normal rate, regular rhythm, normal heart sounds and intact distal pulses.   Pulmonary/Chest: Effort normal and breath sounds normal.  Abdominal: Soft.  Musculoskeletal: Normal range of motion.  Neurological: He is alert and oriented to person, place, and time. He has normal reflexes. Gait normal. GCS score is 15.  Skin: Skin is warm and dry.  Psychiatric: Mood, memory, affect and judgment normal.    Lab Results  Component Value Date  WBC 7.4 11/02/2013   HGB 15.6 11/02/2013   HCT 44 11/02/2013   PLT 276 11/02/2013   GLUCOSE 123* 01/31/2009   CHOL 192 02/02/2015   TRIG 190* 02/02/2015   HDL 38* 02/02/2015   LDLCALC 116* 02/02/2015   TSH 2.30 11/02/2013   PSA 0.6 11/02/2013   INR 1.06 01/07/2009   HGBA1C 7.1 01/30/2015    CMP     Component Value Date/Time   NA 140 11/02/2013   NA 143 01/31/2009 0842   K 4.7 11/02/2013   CL 110 01/31/2009 0842   CO2 24 01/31/2009 0842   GLUCOSE 123* 01/31/2009 0842   BUN 19 11/02/2013   BUN 19 01/31/2009 0842   CREATININE 1.0 11/02/2013   CREATININE 1.3 01/31/2009 0842   CALCIUM 9.0 01/31/2009 0842   PROT 7.8 01/05/2009 1440   ALBUMIN 4.0 01/05/2009 1440   AST 24 11/02/2013   ALT 29 11/02/2013   ALKPHOS 65 11/02/2013   BILITOT 0.8 01/05/2009 1440   GFRNONAA 59.93 01/31/2009 0842   GFRAA  01/05/2009 1440    >60        The eGFR has been calculated using the MDRD equation. This calculation has not been validated in all clinical situations. eGFR's persistently <60 mL/min signify possible Chronic Kidney Disease.    Assessment and Plan :  1. HLD (hyperlipidemia) Doing well on Crestor. Check levels today and follow up for DM. - Lipid Panel  With LDL/HDL Ratio - Hepatic function panel  2. Gout, unspecified cause, unspecified chronicity, unspecified site Followed by Rheumatology.  - Uric acid 3. Prediabetes Patient is a very well with diet and exercise controlling  in this. 4. Atrial fibrillation  Patient was seen and examined by Dr. Miguel Aschoff, and noted scribed by Webb Laws, Vowin[place]el MD [facility] 04/10/2015 10:22 AM

## 2015-04-11 LAB — LIPID PANEL WITH LDL/HDL RATIO
CHOLESTEROL TOTAL: 123 mg/dL (ref 100–199)
HDL: 39 mg/dL — AB (ref >39)
LDL Calculated: 57 mg/dL (ref 0–99)
LDl/HDL Ratio: 1.5 ratio units (ref 0.0–3.6)
TRIGLYCERIDES: 133 mg/dL (ref 0–149)
VLDL Cholesterol Cal: 27 mg/dL (ref 5–40)

## 2015-04-11 LAB — HEPATIC FUNCTION PANEL
ALT: 33 IU/L (ref 0–44)
AST: 26 IU/L (ref 0–40)
Albumin: 4.4 g/dL (ref 3.6–4.8)
Alkaline Phosphatase: 72 IU/L (ref 39–117)
BILIRUBIN TOTAL: 0.4 mg/dL (ref 0.0–1.2)
BILIRUBIN, DIRECT: 0.12 mg/dL (ref 0.00–0.40)
TOTAL PROTEIN: 6.6 g/dL (ref 6.0–8.5)

## 2015-04-11 LAB — URIC ACID: Uric Acid: 3 mg/dL — ABNORMAL LOW (ref 3.7–8.6)

## 2015-04-12 ENCOUNTER — Telehealth: Payer: Self-pay

## 2015-04-12 NOTE — Telephone Encounter (Signed)
Patient advised as directed below. Patient verbalized understanding.  

## 2015-04-12 NOTE — Telephone Encounter (Signed)
-----   Message from Jerrol Banana., MD sent at 04/12/2015  9:44 AM EST ----- Lipids good with LDL of 57 which is below the goal of 70. Uric acid also good at 3 which is below the goal of 6. Continue present medicines

## 2015-04-20 DIAGNOSIS — M5431 Sciatica, right side: Secondary | ICD-10-CM | POA: Diagnosis not present

## 2015-04-20 DIAGNOSIS — M5416 Radiculopathy, lumbar region: Secondary | ICD-10-CM | POA: Diagnosis not present

## 2015-04-20 DIAGNOSIS — M9903 Segmental and somatic dysfunction of lumbar region: Secondary | ICD-10-CM | POA: Diagnosis not present

## 2015-04-20 DIAGNOSIS — M9905 Segmental and somatic dysfunction of pelvic region: Secondary | ICD-10-CM | POA: Diagnosis not present

## 2015-05-18 DIAGNOSIS — M5431 Sciatica, right side: Secondary | ICD-10-CM | POA: Diagnosis not present

## 2015-05-18 DIAGNOSIS — M9903 Segmental and somatic dysfunction of lumbar region: Secondary | ICD-10-CM | POA: Diagnosis not present

## 2015-05-18 DIAGNOSIS — M9905 Segmental and somatic dysfunction of pelvic region: Secondary | ICD-10-CM | POA: Diagnosis not present

## 2015-05-18 DIAGNOSIS — M5416 Radiculopathy, lumbar region: Secondary | ICD-10-CM | POA: Diagnosis not present

## 2015-06-05 DIAGNOSIS — L820 Inflamed seborrheic keratosis: Secondary | ICD-10-CM | POA: Diagnosis not present

## 2015-06-05 DIAGNOSIS — L570 Actinic keratosis: Secondary | ICD-10-CM | POA: Diagnosis not present

## 2015-06-05 DIAGNOSIS — D1800 Hemangioma unspecified site: Secondary | ICD-10-CM | POA: Diagnosis not present

## 2015-06-05 DIAGNOSIS — L821 Other seborrheic keratosis: Secondary | ICD-10-CM | POA: Diagnosis not present

## 2015-06-05 DIAGNOSIS — Z1283 Encounter for screening for malignant neoplasm of skin: Secondary | ICD-10-CM | POA: Diagnosis not present

## 2015-06-05 DIAGNOSIS — L578 Other skin changes due to chronic exposure to nonionizing radiation: Secondary | ICD-10-CM | POA: Diagnosis not present

## 2015-06-16 ENCOUNTER — Other Ambulatory Visit: Payer: Self-pay | Admitting: Family Medicine

## 2015-06-22 DIAGNOSIS — M9905 Segmental and somatic dysfunction of pelvic region: Secondary | ICD-10-CM | POA: Diagnosis not present

## 2015-06-22 DIAGNOSIS — M5431 Sciatica, right side: Secondary | ICD-10-CM | POA: Diagnosis not present

## 2015-06-22 DIAGNOSIS — M5416 Radiculopathy, lumbar region: Secondary | ICD-10-CM | POA: Diagnosis not present

## 2015-06-22 DIAGNOSIS — M9903 Segmental and somatic dysfunction of lumbar region: Secondary | ICD-10-CM | POA: Diagnosis not present

## 2015-06-28 ENCOUNTER — Other Ambulatory Visit: Payer: Self-pay | Admitting: Family Medicine

## 2015-06-28 NOTE — Telephone Encounter (Signed)
Please review, dr gilbert's patient-aa

## 2015-07-03 ENCOUNTER — Ambulatory Visit: Payer: PPO | Admitting: Family Medicine

## 2015-07-03 DIAGNOSIS — E119 Type 2 diabetes mellitus without complications: Secondary | ICD-10-CM | POA: Diagnosis not present

## 2015-07-03 LAB — HM DIABETES EYE EXAM

## 2015-07-05 DIAGNOSIS — L820 Inflamed seborrheic keratosis: Secondary | ICD-10-CM | POA: Diagnosis not present

## 2015-07-05 DIAGNOSIS — L570 Actinic keratosis: Secondary | ICD-10-CM | POA: Diagnosis not present

## 2015-07-07 DIAGNOSIS — M9903 Segmental and somatic dysfunction of lumbar region: Secondary | ICD-10-CM | POA: Diagnosis not present

## 2015-07-07 DIAGNOSIS — M9905 Segmental and somatic dysfunction of pelvic region: Secondary | ICD-10-CM | POA: Diagnosis not present

## 2015-07-07 DIAGNOSIS — M5416 Radiculopathy, lumbar region: Secondary | ICD-10-CM | POA: Diagnosis not present

## 2015-07-07 DIAGNOSIS — M5431 Sciatica, right side: Secondary | ICD-10-CM | POA: Diagnosis not present

## 2015-07-18 DIAGNOSIS — M7552 Bursitis of left shoulder: Secondary | ICD-10-CM | POA: Diagnosis not present

## 2015-07-18 DIAGNOSIS — M79642 Pain in left hand: Secondary | ICD-10-CM | POA: Diagnosis not present

## 2015-07-18 DIAGNOSIS — M1A09X0 Idiopathic chronic gout, multiple sites, without tophus (tophi): Secondary | ICD-10-CM | POA: Diagnosis not present

## 2015-07-18 DIAGNOSIS — M150 Primary generalized (osteo)arthritis: Secondary | ICD-10-CM | POA: Diagnosis not present

## 2015-07-18 DIAGNOSIS — M79641 Pain in right hand: Secondary | ICD-10-CM | POA: Diagnosis not present

## 2015-07-20 DIAGNOSIS — M5431 Sciatica, right side: Secondary | ICD-10-CM | POA: Diagnosis not present

## 2015-07-20 DIAGNOSIS — M9903 Segmental and somatic dysfunction of lumbar region: Secondary | ICD-10-CM | POA: Diagnosis not present

## 2015-07-20 DIAGNOSIS — M5416 Radiculopathy, lumbar region: Secondary | ICD-10-CM | POA: Diagnosis not present

## 2015-07-20 DIAGNOSIS — M9905 Segmental and somatic dysfunction of pelvic region: Secondary | ICD-10-CM | POA: Diagnosis not present

## 2015-07-31 ENCOUNTER — Other Ambulatory Visit: Payer: Self-pay | Admitting: Physician Assistant

## 2015-08-10 DIAGNOSIS — M5416 Radiculopathy, lumbar region: Secondary | ICD-10-CM | POA: Diagnosis not present

## 2015-08-10 DIAGNOSIS — M9905 Segmental and somatic dysfunction of pelvic region: Secondary | ICD-10-CM | POA: Diagnosis not present

## 2015-08-10 DIAGNOSIS — M9903 Segmental and somatic dysfunction of lumbar region: Secondary | ICD-10-CM | POA: Diagnosis not present

## 2015-08-10 DIAGNOSIS — M5431 Sciatica, right side: Secondary | ICD-10-CM | POA: Diagnosis not present

## 2015-08-31 DIAGNOSIS — M9903 Segmental and somatic dysfunction of lumbar region: Secondary | ICD-10-CM | POA: Diagnosis not present

## 2015-08-31 DIAGNOSIS — M5431 Sciatica, right side: Secondary | ICD-10-CM | POA: Diagnosis not present

## 2015-08-31 DIAGNOSIS — M5416 Radiculopathy, lumbar region: Secondary | ICD-10-CM | POA: Diagnosis not present

## 2015-08-31 DIAGNOSIS — M9905 Segmental and somatic dysfunction of pelvic region: Secondary | ICD-10-CM | POA: Diagnosis not present

## 2015-09-14 DIAGNOSIS — M5416 Radiculopathy, lumbar region: Secondary | ICD-10-CM | POA: Diagnosis not present

## 2015-09-14 DIAGNOSIS — M5431 Sciatica, right side: Secondary | ICD-10-CM | POA: Diagnosis not present

## 2015-09-14 DIAGNOSIS — M9905 Segmental and somatic dysfunction of pelvic region: Secondary | ICD-10-CM | POA: Diagnosis not present

## 2015-09-14 DIAGNOSIS — M9903 Segmental and somatic dysfunction of lumbar region: Secondary | ICD-10-CM | POA: Diagnosis not present

## 2015-09-28 DIAGNOSIS — M9903 Segmental and somatic dysfunction of lumbar region: Secondary | ICD-10-CM | POA: Diagnosis not present

## 2015-09-28 DIAGNOSIS — M5416 Radiculopathy, lumbar region: Secondary | ICD-10-CM | POA: Diagnosis not present

## 2015-09-28 DIAGNOSIS — M5431 Sciatica, right side: Secondary | ICD-10-CM | POA: Diagnosis not present

## 2015-09-28 DIAGNOSIS — M9905 Segmental and somatic dysfunction of pelvic region: Secondary | ICD-10-CM | POA: Diagnosis not present

## 2015-10-04 ENCOUNTER — Other Ambulatory Visit: Payer: Self-pay | Admitting: Family Medicine

## 2015-10-04 DIAGNOSIS — E1121 Type 2 diabetes mellitus with diabetic nephropathy: Secondary | ICD-10-CM

## 2015-10-11 ENCOUNTER — Ambulatory Visit (INDEPENDENT_AMBULATORY_CARE_PROVIDER_SITE_OTHER): Payer: PPO | Admitting: Family Medicine

## 2015-10-11 ENCOUNTER — Encounter: Payer: Self-pay | Admitting: Family Medicine

## 2015-10-11 VITALS — BP 124/60 | HR 58 | Temp 97.5°F | Resp 16 | Ht 69.0 in | Wt 179.0 lb

## 2015-10-11 DIAGNOSIS — Z0000 Encounter for general adult medical examination without abnormal findings: Secondary | ICD-10-CM

## 2015-10-11 NOTE — Progress Notes (Signed)
Patient: Benjamin [name], Male    DOB: January 24, 1951, 65 y.o.   MRN: [id number] Visit Date: 10/11/2015  Today's Provider: Wilhemena Durie, MD   Chief Complaint  Patient presents with  . Medicare Wellness   Subjective:   Benjamin [name] is a 65 y.o. male who presents today for his Initial Annual Wellness Visit. He feels well. He reports exercising 4-5 times a week . He reports he is sleeping well.  Colonoscopy- 07/15/05 Diverticulosis  Immunization History  Administered Date(s) Administered  . Influenza-Unspecified 11/12/2014  . Pneumococcal Conjugate-13 09/13/2014  . Pneumococcal Polysaccharide-23 02/06/2000, 06/07/2009  . Td 11/10/2003  . Tdap 10/10/2010  . Zoster 11/14/2010     Review of Systems  Constitutional: Negative.   HENT: Negative.   Eyes: Negative.   Respiratory: Negative.   Cardiovascular: Negative.   Gastrointestinal: Negative.   Endocrine: Negative.   Genitourinary: Negative.   Musculoskeletal: Positive for arthralgias, back pain, myalgias, neck pain and neck stiffness.  Allergic/Immunologic: Negative.   Neurological: Negative.   Hematological: Negative.   Psychiatric/Behavioral: Negative.     Patient Active Problem List   Diagnosis Date Noted  . Erectile dysfunction 03/08/2015  . Allergic rhinitis 07/04/2014  . Arthritis 07/04/2014  . Back pain, chronic 07/04/2014  . Abnormal glucose tolerance test 07/04/2014  . HLD (hyperlipidemia) 07/04/2014  . Raynaud's syndrome 07/04/2014  . Diabetes mellitus, type 2 ([place]) 07/04/2014  . HYPERTENSION, BENIGN 02/20/2009  . HYPOPOTASSEMIA 01/31/2009  . UNSPECIFIED ACUTE PERICARDITIS 01/23/2009  . ATRIAL FIBRILLATION 01/23/2009  . COUGH SYNCOPE 01/23/2009  . DYSLIPIDEMIA 01/20/2009  . Gout 01/20/2009    Social History   Social History  . Marital status: Married    Spouse name: Baldo Ash  . Number of children: 2  . Years of education: 16   Occupational History  . industrial paper products in [place]  . Smoking status: Former Smoker    Types: Cigarettes    Quit date: 01/23/1971  . Smokeless tobacco: Never Used  . Alcohol use Yes     Comment: very occasionally drinks alcohol 2-3 times a week  . Drug use: No  . Sexual activity: Yes   Other Topics Concern  . Not on file   Social History Narrative    SOCIAL HISTORY:  He lives in [place] with his wife.  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  He exercises as stated above.  No tobacco or illicit substance use.  He does enjoy occasional beer and vodka, mixed drink on the weekends.  He tries to follow a low-cholesterol diet.          FAMILY HISTORY:  Mother is alive, she has a pacemaker secondary to heart block, she also has a stable abdominal aneurysm.  Father's health, interesting that he had an MI at age 34 and then apparently died in his sleep at age 52 that was felt to be secondary to MI.  He has 3 siblings who are alive and well.     Past Surgical History:  Procedure Laterality Date  . TONSILLECTOMY AND ADENOIDECTOMY      His family history includes Atrial fibrillation in his mother; CAD in his father; Congestive Heart Failure in his mother; Diabetes in his father; Gout in his father and son; Hearing loss in his son; Heart attack in his father; Hernia in his mother; Hypertension in his father; Other in his son and son; Ovarian  cancer in his mother; Ulcers in his father.    Outpatient Medications Prior to Visit  Medication Sig Dispense Refill  . allopurinol (ZYLOPRIM) 300 MG tablet Take 1 tablet (300 mg total) by mouth 2 (two) times daily. 180 tablet 12  . Cholecalciferol (VITAMIN D3) 2000 UNITS TABS Take 1 tablet by mouth daily.    Marland Kitchen CINNAMON PO Take 2,000 mg by mouth daily.    . cyclobenzaprine (FLEXERIL) 10 MG tablet Take 10 mg by mouth as directed.     Marland Kitchen glucose blood test strip ONETOUCH VERIO (In Vitro Strip)  1 (one) Strip two times daily for 0 days   Quantity: 100;  Refills: 12   Ordered :30-May-2014  Miguel Aschoff MD;  Started 30-May-2014 Active    . HYDROcodone-acetaminophen (NORCO/VICODIN) 5-325 MG tablet Take 0.5-1 tablets by mouth 2 (two) times daily as needed (pain).    Marland Kitchen losartan (COZAAR) 25 MG tablet TAKE 1 TABLET EVERY DAY 30 tablet 12  . meloxicam (MOBIC) 15 MG tablet TAKE 1 TABLET BY MOUTH DAILY AS NEEDED WITH FOOD 30 tablet 12  . metFORMIN (GLUCOPHAGE) 1000 MG tablet TAKE ONE TABLET EVERYDAY WITH DINNER 90 tablet 3  . Omega-3 Fatty Acids (FISH OIL) 1000 MG CAPS Take 1 capsule by mouth daily.    . rivaroxaban (XARELTO) 20 MG TABS tablet Take 1 tablet (20 mg total) by mouth daily with supper. 90 tablet 3  . rosuvastatin (CRESTOR) 10 MG tablet Take 1 tablet (10 mg total) by mouth daily. 90 tablet 3  . sildenafil (REVATIO) 20 MG tablet 2 to 4 tablets daily as needed 50 tablet 5  . vitamin B-12 (CYANOCOBALAMIN) 1000 MCG tablet Take 1,000 mcg by mouth daily.     No facility-administered medications prior to visit.     No Known Allergies  Patient Care Team: Jerrol Banana., MD as PCP - General (Family Medicine)  Objective:   Vitals:  Vitals:   10/11/15 0922  BP: 124/60  Pulse: (!) 58  Resp: 16  Temp: 97.5 F (36.4 C)  TempSrc: Oral  Weight: 179 lb (81.2 kg)  Height: 5\' 9"  (1.753 m)    Visual Acuity Screening   Right eye Left eye Both eyes  Without correction:     With correction: 25/20 2020      Physical Exam  Constitutional: He is oriented to person, place, and time. He appears well-developed and well-nourished.  HENT:  Head: Normocephalic and atraumatic.  Right Ear: External ear normal.  Left Ear: External ear normal.  Nose: Nose normal.  Mouth/Throat: Oropharynx is clear and moist.  Eyes: Conjunctivae and EOM are normal. Pupils are equal, round, and reactive to light.  Neck: Normal range of motion. Neck supple.  Cardiovascular: Normal rate, regular rhythm, normal heart sounds and intact distal  pulses.   Pulmonary/Chest: Effort normal and breath sounds normal.  Abdominal: Soft. Bowel sounds are normal.  Genitourinary: Rectum normal, prostate normal and penis normal.  Musculoskeletal: Normal range of motion.  Neurological: He is alert and oriented to person, place, and time. He has normal reflexes.  Skin: Skin is warm and dry.  Psychiatric: He has a normal mood and affect. His behavior is normal. Judgment and thought content normal.    Activities of Daily Living In your present state of health, do you have any difficulty performing the following activities: 10/11/2015  Hearing? N  Vision? N  Difficulty concentrating or making decisions? N  Walking or climbing stairs? N  Dressing or bathing? N  Doing errands, shopping? N  Some recent data might be hidden    Fall Risk Assessment Fall Risk  10/11/2015 09/13/2014  Falls in the past year? No Yes     Depression Screen PHQ 2/9 Scores 10/11/2015 09/13/2014  PHQ - 2 Score 0 0    Cognitive Testing - 6-CIT    Year: 0 points  Month: 0 points  Memorize "Pia Mau, [address] [place]"  Time (within 1 hour:) 0  points  Count backwards from 20: 0 points  Name months of year: 0 points  Repeat Address: 4 points   Total Score: 4/28  Interpretation : Normal (0-7) Abnormal (8-28)    Assessmen t & Plan:     Annual Wellness Visit Welcome to Medicare Reviewed patient's Family Medical History Reviewed and updated list of patient's medical providers Assessment of cognitive impairment was done Assessed patient's functional ability Established a written schedule for health screening [place] Completed and Reviewed  Exercise Activities and Dietary recommendations Goals    None      Immunization History  Administered Date(s) Administered  . Influenza-Unspecified 11/12/2014  . Pneumococcal Conjugate-13 09/13/2014  . Pneumococcal Polysaccharide-23 02/06/2000, 06/07/2009  . Td 11/10/2003  . Tdap  10/10/2010  . Zoster 11/14/2010    Health Maintenance  Topic Date Due  . Hepatitis C Screening  September 20, 1950  . FOOT EXAM  06/19/1959  . COLONOSCOPY  07/16/2015  . HEMOGLOBIN A1C  07/31/2015  . INFLUENZA VACCINE  09/12/2015  . PNA vac Low Risk Adult (2 of 2 - PPSV23) 09/13/2015  . OPHTHALMOLOGY EXAM  07/02/2016  . TETANUS/TDAP  10/09/2020  . ZOSTAVAX  Completed      Discussed health benefits of physical activity, and encouraged him to engage in regular exercise appropriate for his age and condition.    Miguel Aschoff MD [facility] 10/11/2015 9:24 AM  ------------------------------------------------------------------------------------------------------------

## 2015-10-12 DIAGNOSIS — M5431 Sciatica, right side: Secondary | ICD-10-CM | POA: Diagnosis not present

## 2015-10-12 DIAGNOSIS — M9905 Segmental and somatic dysfunction of pelvic region: Secondary | ICD-10-CM | POA: Diagnosis not present

## 2015-10-12 DIAGNOSIS — M5416 Radiculopathy, lumbar region: Secondary | ICD-10-CM | POA: Diagnosis not present

## 2015-10-12 DIAGNOSIS — M9903 Segmental and somatic dysfunction of lumbar region: Secondary | ICD-10-CM | POA: Diagnosis not present

## 2015-10-19 ENCOUNTER — Encounter: Payer: Self-pay | Admitting: Family Medicine

## 2015-10-19 ENCOUNTER — Ambulatory Visit (INDEPENDENT_AMBULATORY_CARE_PROVIDER_SITE_OTHER): Payer: PPO | Admitting: Family Medicine

## 2015-10-19 VITALS — BP 122/60 | HR 66 | Temp 97.5°F | Resp 16 | Wt 181.0 lb

## 2015-10-19 DIAGNOSIS — D229 Melanocytic nevi, unspecified: Secondary | ICD-10-CM | POA: Diagnosis not present

## 2015-10-19 DIAGNOSIS — I10 Essential (primary) hypertension: Secondary | ICD-10-CM

## 2015-10-19 DIAGNOSIS — L570 Actinic keratosis: Secondary | ICD-10-CM | POA: Diagnosis not present

## 2015-10-19 DIAGNOSIS — L578 Other skin changes due to chronic exposure to nonionizing radiation: Secondary | ICD-10-CM | POA: Diagnosis not present

## 2015-10-19 DIAGNOSIS — Z23 Encounter for immunization: Secondary | ICD-10-CM | POA: Diagnosis not present

## 2015-10-19 DIAGNOSIS — E785 Hyperlipidemia, unspecified: Secondary | ICD-10-CM | POA: Diagnosis not present

## 2015-10-19 DIAGNOSIS — E118 Type 2 diabetes mellitus with unspecified complications: Secondary | ICD-10-CM

## 2015-10-19 LAB — POCT GLYCOSYLATED HEMOGLOBIN (HGB A1C): Hemoglobin A1C: 8.2

## 2015-10-19 MED ORDER — METFORMIN HCL 1000 MG PO TABS
1000.0000 mg | ORAL_TABLET | Freq: Two times a day (BID) | ORAL | 3 refills | Status: DC
Start: 2015-10-19 — End: 2016-02-27

## 2015-10-19 NOTE — Progress Notes (Signed)
Subjective:  HPI  Diabetes Mellitus Type II, Follow-up:   Lab Results  Component Value Date   HGBA1C 7.1 01/30/2015   HGBA1C 6.6 09/13/2014   HGBA1C 7.6 (A) 05/03/2014    Last seen for diabetes 9 months ago.  Management since then includes none. He reports fair compliance with treatment. He is not having side effects.  Current symptoms include none and have been unchanged. Home blood sugar records: 180-250. It was 250 this morning, fasting  Episodes of hypoglycemia? no   Current Insulin Regimen: n/a Most Recent Eye Exam: within a year Weight trend: stable Prior visit with dietician: no Current diet: not asked Current exercise: 4-5 times a week  Pertinent Labs:    Component Value Date/Time   CHOL 123 04/10/2015 1059   TRIG 133 04/10/2015 1059   HDL 39 (L) 04/10/2015 1059   LDLCALC 57 04/10/2015 1059   CREATININE 1.0 11/02/2013   CREATININE 1.3 01/31/2009 0842    Wt Readings from Last 3 Encounters:  10/19/15 181 lb (82.1 kg)  10/11/15 179 lb (81.2 kg)  04/10/15 183 lb (83 kg)   He has had no recent flares with gout and no issues with atrial fibrillation he knows of. Overall he has been feeling well. ------------------------------------------------------------------------    Prior to Admission medications   Medication Sig Start Date End Date Taking? Authorizing Provider  allopurinol (ZYLOPRIM) 300 MG tablet Take 1 tablet (300 mg total) by mouth 2 (two) times daily. 11/16/14   Taiwan Talcott Maceo Pro., MD  Cholecalciferol (VITAMIN D3) 2000 UNITS TABS Take 1 tablet by mouth daily.    Historical Provider, MD  CINNAMON PO Take 2,000 mg by mouth daily.    Historical Provider, MD  cyclobenzaprine (FLEXERIL) 10 MG tablet Take 10 mg by mouth as directed.  07/15/13   Historical Provider, MD  glucose blood test strip ONETOUCH VERIO (In Vitro Strip)  1 (one) Strip two times daily for 0 days  Quantity: 100;  Refills: 12   Ordered :30-May-2014  Miguel Aschoff MD;  Started  30-May-2014 Active 05/30/14   Historical Provider, MD  HYDROcodone-acetaminophen (NORCO/VICODIN) 5-325 MG tablet Take 0.5-1 tablets by mouth 2 (two) times daily as needed (pain).    Historical Provider, MD  losartan (COZAAR) 25 MG tablet TAKE 1 TABLET EVERY DAY 10/04/15   Jerrol Banana., MD  meloxicam (MOBIC) 15 MG tablet TAKE 1 TABLET BY MOUTH DAILY AS NEEDED WITH FOOD 07/31/15   Jerrol Banana., MD  metFORMIN (GLUCOPHAGE) 1000 MG tablet TAKE ONE TABLET EVERYDAY WITH DINNER 06/16/15   Jerrol Banana., MD  Omega-3 Fatty Acids (FISH OIL) 1000 MG CAPS Take 1 capsule by mouth daily.    Historical Provider, MD  rivaroxaban (XARELTO) 20 MG TABS tablet Take 1 tablet (20 mg total) by mouth daily with supper. 03/13/15   Thompson Grayer, MD  rosuvastatin (CRESTOR) 10 MG tablet Take 1 tablet (10 mg total) by mouth daily. 03/08/15   Thompson Grayer, MD  sildenafil (REVATIO) 20 MG tablet 2 to 4 tablets daily as needed 03/09/15   Jerrol Banana., MD  TURMERIC PO Take by mouth.    Historical Provider, MD  vitamin B-12 (CYANOCOBALAMIN) 1000 MCG tablet Take 1,000 mcg by mouth daily.    Historical Provider, MD    Patient Active Problem List   Diagnosis Date Noted  . Erectile dysfunction 03/08/2015  . Allergic rhinitis 07/04/2014  . Arthritis 07/04/2014  . Back pain, chronic 07/04/2014  . Abnormal  glucose tolerance test 07/04/2014  . HLD (hyperlipidemia) 07/04/2014  . Raynaud's syndrome 07/04/2014  . Diabetes mellitus, type 2 ([place]) 07/04/2014  . HYPERTENSION, BENIGN 02/20/2009  . HYPOPOTASSEMIA 01/31/2009  . UNSPECIFIED ACUTE PERICARDITIS 01/23/2009  . ATRIAL FIBRILLATION 01/23/2009  . COUGH SYNCOPE 01/23/2009  . DYSLIPIDEMIA 01/20/2009  . Gout 01/20/2009    Past Medical History:  Diagnosis Date  . Atrial fibrillation (HCC)    paroxysmal  . Cough syncope   . Diabetes mellitus   . Dyslipidemia   . Gout   . Hyperlipidemia   . Hypertension   . Hypopotassemia     Social History    Social History  . Marital status: Married    Spouse name: Baldo Ash  . Number of children: 2  . Years of education: 16   Occupational History  . industrial paper products in [place]  . Smoking status: Former Smoker    Types: Cigarettes    Quit date: 01/23/1971  . Smokeless tobacco: Never Used  . Alcohol use Yes     Comment: very occasionally drinks alcohol 2-3 times a week  . Drug use: No  . Sexual activity: Yes   Other Topics Concern  . Not on file   Social History Narrative    SOCIAL HISTORY:  He lives in [place] with his wife.  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  He has 2 adult sons.  He exercises as stated above.  No tobacco or illicit substance use.  He does enjoy occasional beer and vodka, mixed drink on the weekends.  He tries to follow a low-cholesterol diet.          FAMILY HISTORY:  Mother is alive, she has a pacemaker secondary to heart block, she also has a stable abdominal aneurysm.  Father's health, interesting that he had an MI at age 57 and then apparently died in his sleep at age 55 that was felt to be secondary to MI.  He has 3 siblings who are alive and well.     No Known Allergies  Review of Systems  Constitutional: Negative.   HENT: Negative.   Eyes: Negative.   Respiratory: Negative.   Cardiovascular: Negative.   Gastrointestinal: Negative.   Genitourinary: Negative.   Musculoskeletal: Negative.   Skin: Negative.   Neurological: Negative.   Endo/Heme/Allergies: Negative.   Psychiatric/Behavioral: Negative.     Immunization History  Administered Date(s) Administered  . Influenza-Unspecified 11/12/2014  . Pneumococcal Conjugate-13 09/13/2014  . Pneumococcal Polysaccharide-23 02/06/2000, 06/07/2009  . Td 11/10/2003  . Tdap 10/10/2010  . Zoster 11/14/2010   Objective:  BP 122/60 (BP Location: Left Arm, Patient Position: Sitting, Cuff Size: Normal)   Pulse 66   Temp 97.5 F (36.4  C) (Oral)   Resp 16   Wt 181 lb (82.1 kg)   BMI 26.73 kg/m   Physical Exam  Constitutional: He is oriented to person, place, and time and well-developed, well-nourished, and in no distress.  Eyes: Conjunctivae and EOM are normal. Pupils are equal, round, and reactive to light.  Neck: Normal range of motion. Neck supple.  Cardiovascular: Normal rate, regular rhythm, normal heart sounds and intact distal pulses.   Pulmonary/Chest: Effort normal and breath sounds normal.  Abdominal: Soft. Bowel sounds are normal.  Neurological: He is alert and oriented to person, place, and time. He has normal reflexes. Gait normal. GCS score is 15.  Skin: Skin is warm and dry.  Psychiatric: Mood,  memory, affect and judgment normal.    Lab Results  Component Value Date   WBC 7.4 11/02/2013   HGB 15.6 11/02/2013   HCT 44 11/02/2013   PLT 276 11/02/2013   GLUCOSE 123 (H) 01/31/2009   CHOL 123 04/10/2015   TRIG 133 04/10/2015   HDL 39 (L) 04/10/2015   LDLCALC 57 04/10/2015   TSH 2.30 11/02/2013   PSA 0.6 11/02/2013   INR 1.06 01/07/2009   HGBA1C 7.1 01/30/2015    CMP     Component Value Date/Time   NA 140 11/02/2013   K 4.7 11/02/2013   CL 110 01/31/2009 0842   CO2 24 01/31/2009 0842   GLUCOSE 123 (H) 01/31/2009 0842   BUN 19 11/02/2013   CREATININE 1.0 11/02/2013   CREATININE 1.3 01/31/2009 0842   CALCIUM 9.0 01/31/2009 0842   PROT 6.6 04/10/2015 1059   ALBUMIN 4.4 04/10/2015 1059   AST 26 04/10/2015 1059   ALT 33 04/10/2015 1059   ALKPHOS 72 04/10/2015 1059   BILITOT 0.4 04/10/2015 1059   GFRNONAA 59.93 01/31/2009 0842   GFRAA  01/05/2009 1440    >60        The eGFR has been calculated using the MDRD equation. This calculation has not been validated in all clinical situations. eGFR's persistently <60 mL/min signify possible Chronic Kidney Disease.    Assessment and Plan :  1. Type 2 diabetes mellitus with complication, without long-term current use of insulin [facility]) May  need to add other meds on next visit. Goal A1c less than 7.5 and preferably less than 7 as long as she has no hypoglycemia - POCT HgB A1C 8.2 today. Increase Metformin to 1000 mg BID. - metFORMIN (GLUCOPHAGE) 1000 MG tablet; Take 1 tablet (1,000 mg total) by mouth 2 (two) times daily.  Dispense: 180 tablet; Refill: 3  2. HYPERTENSION, BENIGN  - CBC with Differential/Platelet - TSH  3. HLD (hyperlipidemia)  - Lipid Panel With LDL/HDL Ratio - Comprehensive metabolic panel  4. Need for influenza vaccination  - Flu vaccine HIGH DOSE PF . 5. Gout  Controlled 6. Atrial fibrillation Followed by EP/[name] HPI, Exam, and A&P Transcribed under the direction and in the presence of Joyice Magda L. Cranford Mon, MD  Electronically Signed: Webb Laws, [place] MD [facility] 10/19/2015 4:17 PM

## 2015-10-19 NOTE — Patient Instructions (Addendum)
May add Jardiance at the next visit, if sugars have not improved. Try to check blood sugar 3 times a week.

## 2015-10-20 ENCOUNTER — Other Ambulatory Visit: Payer: Self-pay | Admitting: Family Medicine

## 2015-10-26 DIAGNOSIS — M5416 Radiculopathy, lumbar region: Secondary | ICD-10-CM | POA: Diagnosis not present

## 2015-10-26 DIAGNOSIS — M5431 Sciatica, right side: Secondary | ICD-10-CM | POA: Diagnosis not present

## 2015-10-26 DIAGNOSIS — M9903 Segmental and somatic dysfunction of lumbar region: Secondary | ICD-10-CM | POA: Diagnosis not present

## 2015-10-26 DIAGNOSIS — I10 Essential (primary) hypertension: Secondary | ICD-10-CM | POA: Diagnosis not present

## 2015-10-26 DIAGNOSIS — M9905 Segmental and somatic dysfunction of pelvic region: Secondary | ICD-10-CM | POA: Diagnosis not present

## 2015-10-26 DIAGNOSIS — E785 Hyperlipidemia, unspecified: Secondary | ICD-10-CM | POA: Diagnosis not present

## 2015-10-27 LAB — LIPID PANEL WITH LDL/HDL RATIO
CHOLESTEROL TOTAL: 132 mg/dL (ref 100–199)
HDL: 34 mg/dL — ABNORMAL LOW (ref >39)
LDL Calculated: 35 mg/dL (ref 0–99)
LDL/HDL RATIO: 1 ratio (ref 0.0–3.6)
TRIGLYCERIDES: 314 mg/dL — AB (ref 0–149)
VLDL Cholesterol Cal: 63 mg/dL — ABNORMAL HIGH (ref 5–40)

## 2015-10-27 LAB — COMPREHENSIVE METABOLIC PANEL
ALBUMIN: 4.6 g/dL (ref 3.6–4.8)
ALK PHOS: 71 IU/L (ref 39–117)
ALT: 30 IU/L (ref 0–44)
AST: 25 IU/L (ref 0–40)
Albumin/Globulin Ratio: 2 (ref 1.2–2.2)
BUN / CREAT RATIO: 24 (ref 10–24)
BUN: 22 mg/dL (ref 8–27)
Bilirubin Total: 0.4 mg/dL (ref 0.0–1.2)
CO2: 22 mmol/L (ref 18–29)
CREATININE: 0.91 mg/dL (ref 0.76–1.27)
Calcium: 9.4 mg/dL (ref 8.6–10.2)
Chloride: 100 mmol/L (ref 96–106)
GFR calc Af Amer: 101 mL/min/{1.73_m2} (ref >59)
GFR calc non Af Amer: 88 mL/min/{1.73_m2} (ref >59)
GLUCOSE: 202 mg/dL — AB (ref 65–99)
Globulin, Total: 2.3 g/dL (ref 1.5–4.5)
Potassium: 4.6 mmol/L (ref 3.5–5.2)
Sodium: 139 mmol/L (ref 134–144)
Total Protein: 6.9 g/dL (ref 6.0–8.5)

## 2015-10-27 LAB — CBC WITH DIFFERENTIAL/PLATELET
BASOS ABS: 0 10*3/uL (ref 0.0–0.2)
Basos: 1 %
EOS (ABSOLUTE): 0.2 10*3/uL (ref 0.0–0.4)
Eos: 3 %
HEMATOCRIT: 41 % (ref 37.5–51.0)
Hemoglobin: 14.3 g/dL (ref 12.6–17.7)
Immature Grans (Abs): 0 10*3/uL (ref 0.0–0.1)
Immature Granulocytes: 0 %
LYMPHS ABS: 2.4 10*3/uL (ref 0.7–3.1)
Lymphs: 30 %
MCH: 29.2 pg (ref 26.6–33.0)
MCHC: 34.9 g/dL (ref 31.5–35.7)
MCV: 84 fL (ref 79–97)
MONOS ABS: 0.6 10*3/uL (ref 0.1–0.9)
Monocytes: 7 %
Neutrophils Absolute: 4.6 10*3/uL (ref 1.4–7.0)
Neutrophils: 59 %
Platelets: 267 10*3/uL (ref 150–379)
RBC: 4.89 x10E6/uL (ref 4.14–5.80)
RDW: 13.7 % (ref 12.3–15.4)
WBC: 7.9 10*3/uL (ref 3.4–10.8)

## 2015-10-27 LAB — TSH: TSH: 1.93 u[IU]/mL (ref 0.450–4.500)

## 2015-10-30 ENCOUNTER — Telehealth: Payer: Self-pay

## 2015-10-30 NOTE — Telephone Encounter (Signed)
-----   Message from Jerrol Banana., MD sent at 10/30/2015  8:44 AM EDT ----- Triglycerides have  jumped up with his sugar. Work on diet and exercise.

## 2015-10-30 NOTE — Telephone Encounter (Signed)
Advised pt of lab results. Pt verbally acknowledges understanding. Emily Drozdowski, CMA   

## 2015-11-09 DIAGNOSIS — M5431 Sciatica, right side: Secondary | ICD-10-CM | POA: Diagnosis not present

## 2015-11-09 DIAGNOSIS — M9905 Segmental and somatic dysfunction of pelvic region: Secondary | ICD-10-CM | POA: Diagnosis not present

## 2015-11-09 DIAGNOSIS — M5416 Radiculopathy, lumbar region: Secondary | ICD-10-CM | POA: Diagnosis not present

## 2015-11-09 DIAGNOSIS — M9903 Segmental and somatic dysfunction of lumbar region: Secondary | ICD-10-CM | POA: Diagnosis not present

## 2015-11-23 DIAGNOSIS — M5431 Sciatica, right side: Secondary | ICD-10-CM | POA: Diagnosis not present

## 2015-11-23 DIAGNOSIS — M9903 Segmental and somatic dysfunction of lumbar region: Secondary | ICD-10-CM | POA: Diagnosis not present

## 2015-11-23 DIAGNOSIS — M5416 Radiculopathy, lumbar region: Secondary | ICD-10-CM | POA: Diagnosis not present

## 2015-11-23 DIAGNOSIS — M9905 Segmental and somatic dysfunction of pelvic region: Secondary | ICD-10-CM | POA: Diagnosis not present

## 2015-12-21 DIAGNOSIS — M9903 Segmental and somatic dysfunction of lumbar region: Secondary | ICD-10-CM | POA: Diagnosis not present

## 2015-12-21 DIAGNOSIS — M5416 Radiculopathy, lumbar region: Secondary | ICD-10-CM | POA: Diagnosis not present

## 2015-12-21 DIAGNOSIS — M5431 Sciatica, right side: Secondary | ICD-10-CM | POA: Diagnosis not present

## 2015-12-21 DIAGNOSIS — M9905 Segmental and somatic dysfunction of pelvic region: Secondary | ICD-10-CM | POA: Diagnosis not present

## 2015-12-25 ENCOUNTER — Other Ambulatory Visit: Payer: Self-pay | Admitting: Family Medicine

## 2016-01-18 DIAGNOSIS — M9905 Segmental and somatic dysfunction of pelvic region: Secondary | ICD-10-CM | POA: Diagnosis not present

## 2016-01-18 DIAGNOSIS — M9903 Segmental and somatic dysfunction of lumbar region: Secondary | ICD-10-CM | POA: Diagnosis not present

## 2016-01-18 DIAGNOSIS — M5431 Sciatica, right side: Secondary | ICD-10-CM | POA: Diagnosis not present

## 2016-01-18 DIAGNOSIS — M5416 Radiculopathy, lumbar region: Secondary | ICD-10-CM | POA: Diagnosis not present

## 2016-02-12 ENCOUNTER — Telehealth: Payer: Self-pay | Admitting: Cardiology

## 2016-02-12 NOTE — Telephone Encounter (Signed)
Pt called- he has noticed fast HR today-> 150, He is otherwise asymptomatic. He is on Xarelto. I suggested he take Metoprolol 50 mg now and repeat in 2 hours if HR > 130. If his HR returns to normal he'll take 25 mg BID. He has a follow up with Dr Rayann Heman scheduled for 02/21/16 and will keep this, if he develops any dyspnea or becomes uncomfortable he'll call back. Rx called into the only 24 hr pharmacy open- [facility] on [address].   Kerin Ransom PA-C 02/12/2016 7:09 PM

## 2016-02-15 DIAGNOSIS — M9903 Segmental and somatic dysfunction of lumbar region: Secondary | ICD-10-CM | POA: Diagnosis not present

## 2016-02-15 DIAGNOSIS — M9905 Segmental and somatic dysfunction of pelvic region: Secondary | ICD-10-CM | POA: Diagnosis not present

## 2016-02-15 DIAGNOSIS — M5431 Sciatica, right side: Secondary | ICD-10-CM | POA: Diagnosis not present

## 2016-02-15 DIAGNOSIS — M5416 Radiculopathy, lumbar region: Secondary | ICD-10-CM | POA: Diagnosis not present

## 2016-02-19 ENCOUNTER — Other Ambulatory Visit: Payer: Self-pay | Admitting: *Deleted

## 2016-02-19 MED ORDER — RIVAROXABAN 20 MG PO TABS: 20.0000 mg | ORAL_TABLET | Freq: Every day | ORAL | 1 refills | Status: DC

## 2016-02-21 ENCOUNTER — Encounter: Payer: Self-pay | Admitting: Internal Medicine

## 2016-02-21 ENCOUNTER — Ambulatory Visit (INDEPENDENT_AMBULATORY_CARE_PROVIDER_SITE_OTHER): Payer: PPO | Admitting: Internal Medicine

## 2016-02-21 VITALS — BP 130/82 | HR 61 | Ht 69.0 in | Wt 175.2 lb

## 2016-02-21 DIAGNOSIS — I480 Paroxysmal atrial fibrillation: Secondary | ICD-10-CM | POA: Diagnosis not present

## 2016-02-21 DIAGNOSIS — I10 Essential (primary) hypertension: Secondary | ICD-10-CM | POA: Diagnosis not present

## 2016-02-21 MED ORDER — METOPROLOL TARTRATE 50 MG PO TABS: ORAL_TABLET | ORAL | Status: DC

## 2016-02-21 NOTE — Patient Instructions (Signed)
Medication Instructions:  Your physician has recommended you make the following change in your medication:  1) take your Metoprolol as needed for palpitations or fast heart rates   Labwork: None ordered   Testing/Procedures: Your physician has requested that you have an echocardiogram. Echocardiography is a painless test that uses sound waves to create images of your heart. It provides your doctor with information about the size and shape of your heart and how well your heart's chambers and valves are working. This procedure takes approximately one hour. There are no restrictions for this procedure.    Follow-Up: Your physician wants you to follow-up in: 12 months with Dr Vallery Ridge will receive a reminder letter in the mail two months in advance. If you don't receive a letter, please call our office to schedule the follow-up appointment.   Any Other Special Instructions Will Be Listed Below (If Applicable).     If you need a refill on your cardiac medications before your next appointment, please call your pharmacy.

## 2016-02-21 NOTE — Progress Notes (Signed)
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  Since last being seen in our clinic, the patient reports doing very well.  He did have palpitations for several hours 02/12/16.  He denies ETOH the night before but had been stressed with the illness of his mother and had not been sleeping well for about 2 weeks prior.  He has had no other episodes over the past year.  Today, he denies symptoms of chest pain, shortness of breath,  lower extremity edema, dizziness, presyncope, or syncope.  Denies bleeding or issues with pradaxa.  The patient is otherwise without complaint today.   Past Medical History:  Diagnosis Date  . Atrial fibrillation (HCC)    paroxysmal  . Cough syncope   . Diabetes mellitus   . Dyslipidemia   . Gout   . Hyperlipidemia   . Hypertension   . Hypopotassemia    Past Surgical History:  Procedure Laterality Date  . TONSILLECTOMY AND ADENOIDECTOMY      Current Outpatient Prescriptions  Medication Sig Dispense Refill  . allopurinol (ZYLOPRIM) 300 MG tablet TAKE TWO TABLETS DAILY 180 tablet 3  . Cholecalciferol (VITAMIN D3) 2000 UNITS TABS Take 1 tablet by mouth daily.    Marland Kitchen CINNAMON PO Take 2,000 mg by mouth daily.    . cyclobenzaprine (FLEXERIL) 10 MG tablet Take 10 mg by mouth as directed.     Marland Kitchen glucose blood (ONETOUCH VERIO) test strip Check sugar once daily DX E11.9 30 each 12  . HYDROcodone-acetaminophen (NORCO/VICODIN) 5-325 MG tablet Take 0.5-1 tablets by mouth 2 (two) times daily as needed (pain).    Marland Kitchen losartan (COZAAR) 25 MG tablet TAKE 1 TABLET EVERY DAY 30 tablet 12  . meloxicam (MOBIC) 15 MG tablet TAKE 1 TABLET BY MOUTH DAILY AS NEEDED WITH FOOD 30 tablet 12  . metFORMIN (GLUCOPHAGE) 1000 MG tablet Take 1 tablet (1,000 mg total) by mouth 2 (two) times daily. 180 tablet 3  . metoprolol (LOPRESSOR) 50 MG tablet Take one-half tablet (25 mg) by mouth twice daily.    . Omega-3 Fatty Acids  (FISH OIL) 1000 MG CAPS Take 1 capsule by mouth daily.    . rivaroxaban (XARELTO) 20 MG TABS tablet Take 1 tablet (20 mg total) by mouth daily with supper. 90 tablet 1  . rosuvastatin (CRESTOR) 10 MG tablet Take 1 tablet (10 mg total) by mouth daily. 90 tablet 3  . sildenafil (REVATIO) 20 MG tablet 2 to 4 tablets daily as needed 50 tablet 5  . TURMERIC PO Take by mouth.    . vitamin B-12 (CYANOCOBALAMIN) 1000 MCG tablet Take 1,000 mcg by mouth daily.     No current facility-administered medications for this visit.     Physical Exam: Vitals:   02/21/16 0849  BP: 130/82  Pulse: 61  Weight: 175 lb 3.2 oz (79.5 kg)  Height: 5\' 9"  (1.753 m)    GEN- The patient is well appearing, alert and oriented x 3 today.   Head- normocephalic, atraumatic Eyes-  Sclera clear, conjunctiva pink Ears- hearing intact Oropharynx- clear Lungs- Clear to ausculation bilaterally, normal work of breathing Heart- Regular rate and rhythm, no murmurs, rubs or gallops, PMI not laterally displaced GI- soft, NT, ND, + BS Extremities- no clubbing, cyanosis, or edema  ekg today reveals sinus rhythm 61 bpm, PR 214 msec, normal ekg  Assessment and Plan:  1. afib  Maintaining sinus rhythm  Recently placed on metoprolol.  I will convert this to prn chads2vasc is 3.  Continue xarelto Echo to evaluate for structural changes Phone number to AF clinic given to patient today  2. HTN Stable No change required today  3. HL Stable No change required today  4. Erectile dysfunction Stable No change required today  Return in 1year to see me unless problems arise in the interim  Thompson Grayer MD, [facility] 02/21/2016 9:13 AM

## 2016-02-27 ENCOUNTER — Ambulatory Visit (INDEPENDENT_AMBULATORY_CARE_PROVIDER_SITE_OTHER): Payer: PPO | Admitting: Family Medicine

## 2016-02-27 ENCOUNTER — Encounter: Payer: Self-pay | Admitting: Family Medicine

## 2016-02-27 VITALS — BP 124/68 | HR 68 | Temp 97.8°F | Resp 16 | Wt 175.0 lb

## 2016-02-27 DIAGNOSIS — M1990 Unspecified osteoarthritis, unspecified site: Secondary | ICD-10-CM

## 2016-02-27 DIAGNOSIS — E118 Type 2 diabetes mellitus with unspecified complications: Secondary | ICD-10-CM

## 2016-02-27 DIAGNOSIS — M545 Low back pain: Secondary | ICD-10-CM

## 2016-02-27 DIAGNOSIS — I10 Essential (primary) hypertension: Secondary | ICD-10-CM

## 2016-02-27 DIAGNOSIS — G8929 Other chronic pain: Secondary | ICD-10-CM | POA: Diagnosis not present

## 2016-02-27 LAB — POCT UA - MICROALBUMIN

## 2016-02-27 LAB — POCT GLYCOSYLATED HEMOGLOBIN (HGB A1C)
ESTIMATED AVERAGE GLUCOSE: 163
HEMOGLOBIN A1C: 7.3

## 2016-02-27 MED ORDER — METFORMIN HCL ER (MOD) 1000 MG PO TB24: 1000.0000 mg | ORAL_TABLET | Freq: Two times a day (BID) | ORAL | 5 refills | Status: DC

## 2016-02-27 MED ORDER — HYDROCODONE-ACETAMINOPHEN 5-325 MG PO TABS: 0.5000 | ORAL_TABLET | Freq: Two times a day (BID) | ORAL | 0 refills | Status: DC | PRN

## 2016-02-27 MED ORDER — METFORMIN HCL ER 500 MG PO TB24: 500.0000 mg | ORAL_TABLET | Freq: Two times a day (BID) | ORAL | 5 refills | Status: DC

## 2016-02-27 NOTE — Progress Notes (Signed)
Patient: Benjamin [name] Male    DOB: Apr 05, 1950   66 y.o.   MRN: [id number] Visit Date: 02/27/2016  Today's Provider: Wilhemena Durie, MD   Chief Complaint  Patient presents with  . Diabetes  . Hypertension   Subjective:    HPI      Diabetes Mellitus Type II, Follow-up:   Lab Results  Component Value Date   HGBA1C 8.2 10/19/2015   HGBA1C 7.1 01/30/2015   HGBA1C 6.6 09/13/2014    Last seen for diabetes 4 months ago.  Management since then includes increasing Metformin to 1000 mg po BID (may need to add another medicine if still high today). He reports good compliance with treatment. He is having side effects. diarrhea Current symptoms include none and have been stable. Home blood sugar records: fasting range: 175-195  Episodes of hypoglycemia? no   Current Insulin Regimen: N/A Most Recent Eye Exam: Spring of 2017 Weight trend: stable Prior visit with dietician: yes Current diet: in general, a "healthy" diet   Current exercise: 3-4 days per week; treadmill 30-40 minutes and weight training for about 15 minutes  Pertinent Labs:    Component Value Date/Time   CHOL 132 10/26/2015 0827   TRIG 314 (H) 10/26/2015 0827   HDL 34 (L) 10/26/2015 0827   LDLCALC 35 10/26/2015 0827   CREATININE 0.91 10/26/2015 0827    Wt Readings from Last 3 Encounters:  02/27/16 175 lb (79.4 kg)  02/21/16 175 lb 3.2 oz (79.5 kg)  10/19/15 181 lb (82.1 kg)    ------------------------------------------------------------------------  Hypertension, follow-up:  BP Readings from Last 3 Encounters:  02/27/16 124/68  02/21/16 130/82  10/19/15 122/60    He was last seen for hypertension 4 months ago.  BP at that visit was 122/60. Management since that visit includes none. He reports good compliance with treatment. He is not having side effects.  He is exercising. He is adherent to low salt diet.   Outside blood pressures are sable per pt. He is experiencing none.    Patient denies chest pain, chest pressure/discomfort, claudication, dyspnea, exertional chest pressure/discomfort, fatigue, irregular heart beat, lower extremity edema, near-syncope, orthopnea, palpitations and syncope.   Cardiovascular risk factors include advanced age (older than 68 for men, 39 for women), diabetes mellitus, dyslipidemia, family history of premature cardiovascular disease, hypertension and male gender.      Weight trend: stable Wt Readings from Last 3 Encounters:  02/27/16 175 lb (79.4 kg)  02/21/16 175 lb 3.2 oz (79.5 kg)  10/19/15 181 lb (82.1 kg)    Current diet: in general, a "healthy" diet    ------------------------------------------------------------------------ Pt also needs a refill of Norco for back pain. Pt reports the Meloxicam is not working for his arthralgias. He is questioning of he needs to change medications.  No Known Allergies   Current Outpatient Prescriptions:  .  allopurinol (ZYLOPRIM) 300 MG tablet, TAKE TWO TABLETS DAILY, Disp: 180 tablet, Rfl: 3 .  Cholecalciferol (VITAMIN D3) 2000 UNITS TABS, Take 1 tablet by mouth daily., Disp: , Rfl:  .  CINNAMON PO, Take 2,000 mg by mouth daily., Disp: , Rfl:  .  cyclobenzaprine (FLEXERIL) 10 MG tablet, Take 10 mg by mouth as directed. , Disp: , Rfl:  .  glucose blood (ONETOUCH VERIO) test strip, Check sugar once daily DX E11.9, Disp: 30 each, Rfl: 12 .  losartan (COZAAR) 25 MG tablet, TAKE 1 TABLET EVERY DAY, Disp: 30 tablet, Rfl: 12 .  meloxicam (MOBIC)  15 MG tablet, TAKE 1 TABLET BY MOUTH DAILY AS NEEDED WITH FOOD, Disp: 30 tablet, Rfl: 12 .  metFORMIN (GLUCOPHAGE) 1000 MG tablet, Take 1 tablet (1,000 mg total) by mouth 2 (two) times daily., Disp: 180 tablet, Rfl: 3 .  Omega-3 Fatty Acids (FISH OIL) 1000 MG CAPS, Take 1 capsule by mouth daily., Disp: , Rfl:  .  rivaroxaban (XARELTO) 20 MG TABS tablet, Take 1 tablet (20 mg total) by mouth daily with supper., Disp: 90 tablet, Rfl: 1 .  rosuvastatin  (CRESTOR) 10 MG tablet, Take 1 tablet (10 mg total) by mouth daily., Disp: 90 tablet, Rfl: 3 .  TURMERIC PO, Take by mouth., Disp: , Rfl:  .  vitamin B-12 (CYANOCOBALAMIN) 1000 MCG tablet, Take 1,000 mcg by mouth daily., Disp: , Rfl:  .  HYDROcodone-acetaminophen (NORCO/VICODIN) 5-325 MG tablet, Take 0.5-1 tablets by mouth 2 (two) times daily as needed (pain)., Disp: , Rfl:  .  metoprolol (LOPRESSOR) 50 MG tablet, Take one-half tablet (25 mg) by mouth twice daily as needed for palpitations and/or fast heart rates (Patient not taking: Reported on 02/27/2016), Disp: , Rfl:  .  sildenafil (REVATIO) 20 MG tablet, 2 to 4 tablets daily as needed (Patient not taking: Reported on 02/27/2016), Disp: 50 tablet, Rfl: 5  Review of Systems  Constitutional: Negative for activity change, appetite change, chills, diaphoresis, fatigue, fever and unexpected weight change.  HENT: Negative.   Eyes: Negative.   Respiratory: Negative for shortness of breath.   Cardiovascular: Negative for chest pain, palpitations and leg swelling.  Endocrine: Negative for polydipsia, polyphagia and polyuria.  Allergic/Immunologic: Negative.   Neurological: Negative.   Hematological: Negative.   Psychiatric/Behavioral: Negative.     Social History  Substance Use Topics  . Smoking status: Former Smoker    Types: Cigarettes    Quit date: 01/23/1971  . Smokeless tobacco: Never Used  . Alcohol use Yes     Comment: very occasionally drinks alcohol 2-3 times a week   Objective:   BP 124/68 (BP Location: Left Arm, Patient Position: Sitting, Cuff Size: Normal)   Pulse 68   Temp 97.8 F (36.6 C) (Oral)   Resp 16   Wt 175 lb (79.4 kg)   BMI 25.84 kg/m   Physical Exam  Constitutional: He appears well-developed and well-nourished.  HENT:  Head: Normocephalic.  Eyes: Conjunctivae are normal.  Neck: Normal range of motion.  Cardiovascular: Normal rate, regular rhythm and normal heart sounds.   Pulmonary/Chest: Effort normal  and breath sounds normal. No respiratory distress.  Psychiatric: He has a normal mood and affect. His behavior is normal.        Assessment & Plan:     1. Type 2 diabetes mellitus with complication, without long-term current use of insulin (HCC) - POCT glycosylated hemoglobin (Hb A1C) - POCT UA - Microalbumin - metFORMIN (GLUMETZA) 1000 MG (MOD) 24 hr tablet; Take 1 tablet (1,000 mg total) by mouth 2 (two) times daily.  Dispense: 60 tablet; Refill: 5 Results for orders placed or performed in visit on 02/27/16  POCT glycosylated hemoglobin (Hb A1C)  Result Value Ref Range   Hemoglobin A1C 7.3    Est. average glucose Bld gHb Est-mCnc 163   POCT UA - Microalbumin  Result Value Ref Range   Microalbumin Ur, POC <50 mg/L  A1C improved, though Metformin caused diarrhea. Will try extended release. Call if diarrhea is not improved after 1 month. Will add Jardiance at that time.   2. Chronic low back pain,  unspecified back pain laterality, with sciatica presence unspecified Refills provided. - HYDROcodone-acetaminophen (NORCO/VICODIN) 5-325 MG tablet; Take 0.5-1 tablets by mouth 2 (two) times daily as needed (pain).  Dispense: 60 tablet; Refill: 0  3. Arthritis D/C Meloxicam and any other NSAID due to interaction with Xarelto. Advised pt to FU with rheumatology ASAP for treatment.  4. HYPERTENSION, BENIGN Stable. Continue medications. 5.Afib 6.Gout    Patient seen and examined by Benjamin Aschoff, MD, and note scribed by Benjamin [name], CMA. I have done the exam and reviewed the above chart and it is accurate to the best of my knowledge. Development worker, community has been used in this note in any air is in the dictation or transcription are unintentional.  Wilhemena Durie, MD  [place]

## 2016-02-29 ENCOUNTER — Other Ambulatory Visit: Payer: Self-pay | Admitting: Internal Medicine

## 2016-02-29 DIAGNOSIS — I10 Essential (primary) hypertension: Secondary | ICD-10-CM

## 2016-03-06 ENCOUNTER — Other Ambulatory Visit: Payer: Self-pay

## 2016-03-06 ENCOUNTER — Ambulatory Visit (HOSPITAL_COMMUNITY): Payer: PPO | Attending: Cardiology

## 2016-03-06 DIAGNOSIS — I480 Paroxysmal atrial fibrillation: Secondary | ICD-10-CM | POA: Diagnosis not present

## 2016-03-14 DIAGNOSIS — M5416 Radiculopathy, lumbar region: Secondary | ICD-10-CM | POA: Diagnosis not present

## 2016-03-14 DIAGNOSIS — M9905 Segmental and somatic dysfunction of pelvic region: Secondary | ICD-10-CM | POA: Diagnosis not present

## 2016-03-14 DIAGNOSIS — M5431 Sciatica, right side: Secondary | ICD-10-CM | POA: Diagnosis not present

## 2016-03-14 DIAGNOSIS — M9903 Segmental and somatic dysfunction of lumbar region: Secondary | ICD-10-CM | POA: Diagnosis not present

## 2016-04-11 DIAGNOSIS — M9905 Segmental and somatic dysfunction of pelvic region: Secondary | ICD-10-CM | POA: Diagnosis not present

## 2016-04-11 DIAGNOSIS — M5416 Radiculopathy, lumbar region: Secondary | ICD-10-CM | POA: Diagnosis not present

## 2016-04-11 DIAGNOSIS — M9903 Segmental and somatic dysfunction of lumbar region: Secondary | ICD-10-CM | POA: Diagnosis not present

## 2016-04-11 DIAGNOSIS — M5431 Sciatica, right side: Secondary | ICD-10-CM | POA: Diagnosis not present

## 2016-05-01 DIAGNOSIS — L578 Other skin changes due to chronic exposure to nonionizing radiation: Secondary | ICD-10-CM | POA: Diagnosis not present

## 2016-05-01 DIAGNOSIS — L820 Inflamed seborrheic keratosis: Secondary | ICD-10-CM | POA: Diagnosis not present

## 2016-05-01 DIAGNOSIS — L570 Actinic keratosis: Secondary | ICD-10-CM | POA: Diagnosis not present

## 2016-05-07 DIAGNOSIS — M7552 Bursitis of left shoulder: Secondary | ICD-10-CM | POA: Diagnosis not present

## 2016-05-07 DIAGNOSIS — M25511 Pain in right shoulder: Secondary | ICD-10-CM | POA: Diagnosis not present

## 2016-05-07 DIAGNOSIS — M79642 Pain in left hand: Secondary | ICD-10-CM | POA: Diagnosis not present

## 2016-05-07 DIAGNOSIS — M1A09X0 Idiopathic chronic gout, multiple sites, without tophus (tophi): Secondary | ICD-10-CM | POA: Diagnosis not present

## 2016-05-07 DIAGNOSIS — E663 Overweight: Secondary | ICD-10-CM | POA: Diagnosis not present

## 2016-05-07 DIAGNOSIS — M542 Cervicalgia: Secondary | ICD-10-CM | POA: Diagnosis not present

## 2016-05-07 DIAGNOSIS — M150 Primary generalized (osteo)arthritis: Secondary | ICD-10-CM | POA: Diagnosis not present

## 2016-05-07 DIAGNOSIS — Z6825 Body mass index (BMI) 25.0-25.9, adult: Secondary | ICD-10-CM | POA: Diagnosis not present

## 2016-05-07 DIAGNOSIS — M79641 Pain in right hand: Secondary | ICD-10-CM | POA: Diagnosis not present

## 2016-05-08 DIAGNOSIS — M25512 Pain in left shoulder: Secondary | ICD-10-CM | POA: Diagnosis not present

## 2016-05-08 DIAGNOSIS — M25511 Pain in right shoulder: Secondary | ICD-10-CM | POA: Diagnosis not present

## 2016-05-08 DIAGNOSIS — M25531 Pain in right wrist: Secondary | ICD-10-CM | POA: Diagnosis not present

## 2016-05-08 DIAGNOSIS — M542 Cervicalgia: Secondary | ICD-10-CM | POA: Diagnosis not present

## 2016-05-08 DIAGNOSIS — M25532 Pain in left wrist: Secondary | ICD-10-CM | POA: Diagnosis not present

## 2016-05-08 DIAGNOSIS — M545 Low back pain: Secondary | ICD-10-CM | POA: Diagnosis not present

## 2016-05-14 DIAGNOSIS — M25512 Pain in left shoulder: Secondary | ICD-10-CM | POA: Diagnosis not present

## 2016-05-14 DIAGNOSIS — M25511 Pain in right shoulder: Secondary | ICD-10-CM | POA: Diagnosis not present

## 2016-05-16 DIAGNOSIS — M9903 Segmental and somatic dysfunction of lumbar region: Secondary | ICD-10-CM | POA: Diagnosis not present

## 2016-05-16 DIAGNOSIS — M25512 Pain in left shoulder: Secondary | ICD-10-CM | POA: Diagnosis not present

## 2016-05-16 DIAGNOSIS — M25511 Pain in right shoulder: Secondary | ICD-10-CM | POA: Diagnosis not present

## 2016-05-16 DIAGNOSIS — M5431 Sciatica, right side: Secondary | ICD-10-CM | POA: Diagnosis not present

## 2016-05-16 DIAGNOSIS — M5416 Radiculopathy, lumbar region: Secondary | ICD-10-CM | POA: Diagnosis not present

## 2016-05-16 DIAGNOSIS — M9905 Segmental and somatic dysfunction of pelvic region: Secondary | ICD-10-CM | POA: Diagnosis not present

## 2016-05-20 DIAGNOSIS — M25511 Pain in right shoulder: Secondary | ICD-10-CM | POA: Diagnosis not present

## 2016-05-20 DIAGNOSIS — M25512 Pain in left shoulder: Secondary | ICD-10-CM | POA: Diagnosis not present

## 2016-05-20 DIAGNOSIS — M25532 Pain in left wrist: Secondary | ICD-10-CM | POA: Diagnosis not present

## 2016-05-22 DIAGNOSIS — M25512 Pain in left shoulder: Secondary | ICD-10-CM | POA: Diagnosis not present

## 2016-05-22 DIAGNOSIS — M25511 Pain in right shoulder: Secondary | ICD-10-CM | POA: Diagnosis not present

## 2016-05-29 DIAGNOSIS — M25511 Pain in right shoulder: Secondary | ICD-10-CM | POA: Diagnosis not present

## 2016-05-29 DIAGNOSIS — M25512 Pain in left shoulder: Secondary | ICD-10-CM | POA: Diagnosis not present

## 2016-06-05 DIAGNOSIS — M25512 Pain in left shoulder: Secondary | ICD-10-CM | POA: Diagnosis not present

## 2016-06-05 DIAGNOSIS — M25511 Pain in right shoulder: Secondary | ICD-10-CM | POA: Diagnosis not present

## 2016-06-10 ENCOUNTER — Other Ambulatory Visit: Payer: Self-pay | Admitting: Family Medicine

## 2016-06-11 DIAGNOSIS — M25511 Pain in right shoulder: Secondary | ICD-10-CM | POA: Diagnosis not present

## 2016-06-11 DIAGNOSIS — M25512 Pain in left shoulder: Secondary | ICD-10-CM | POA: Diagnosis not present

## 2016-06-18 ENCOUNTER — Encounter: Payer: Self-pay | Admitting: Family Medicine

## 2016-06-18 ENCOUNTER — Ambulatory Visit (INDEPENDENT_AMBULATORY_CARE_PROVIDER_SITE_OTHER): Payer: PPO | Admitting: Family Medicine

## 2016-06-18 VITALS — BP 122/78 | HR 68 | Temp 98.5°F | Resp 16 | Wt 170.0 lb

## 2016-06-18 DIAGNOSIS — I10 Essential (primary) hypertension: Secondary | ICD-10-CM | POA: Diagnosis not present

## 2016-06-18 DIAGNOSIS — E118 Type 2 diabetes mellitus with unspecified complications: Secondary | ICD-10-CM

## 2016-06-18 DIAGNOSIS — G4709 Other insomnia: Secondary | ICD-10-CM | POA: Diagnosis not present

## 2016-06-18 LAB — POCT GLYCOSYLATED HEMOGLOBIN (HGB A1C)
Est. average glucose Bld gHb Est-mCnc: 169
Hemoglobin A1C: 7.5

## 2016-06-18 MED ORDER — TEMAZEPAM 30 MG PO CAPS: 30.0000 mg | ORAL_CAPSULE | Freq: Every evening | ORAL | 0 refills | Status: DC | PRN

## 2016-06-18 NOTE — Progress Notes (Signed)
Patient: Benjamin [name] Male    DOB: 30-Mar-1950   66 y.o.   MRN: [id number] Visit Date: 06/18/2016  Today's Provider: Wilhemena Durie, MD   Chief Complaint  Patient presents with  . Diabetes  . Hypertension   Subjective:    HPI  Diabetes Mellitus Type II, Follow-up:   Lab Results  Component Value Date   HGBA1C 7.5 06/18/2016   HGBA1C 7.3 02/27/2016   HGBA1C 8.2 10/19/2015    Last seen for diabetes 4 months ago.  Management since then includes changing Metformin to ER. He reports good compliance with treatment. He is not having side effects.  Current symptoms include none and have been stable. Home blood sugar records: fasting range: 160 this morning.   Episodes of hypoglycemia? no   Current Insulin Regimen: none Most Recent Eye Exam: up to date Weight trend: stable Prior visit with dietician: no Current diet: well balanced Current exercise: walking  Pertinent Labs:    Component Value Date/Time   CHOL 132 10/26/2015 0827   TRIG 314 (H) 10/26/2015 0827   HDL 34 (L) 10/26/2015 0827   LDLCALC 35 10/26/2015 0827   CREATININE 0.91 10/26/2015 0827    Wt Readings from Last 3 Encounters:  06/18/16 170 lb (77.1 kg)  02/27/16 175 lb (79.4 kg)  02/21/16 175 lb 3.2 oz (79.5 kg)      Hypertension, follow-up:  BP Readings from Last 3 Encounters:  06/18/16 122/78  02/27/16 124/68  02/21/16 130/82    He was last seen for hypertension 4 months ago.  BP at that visit was 124/678. Management since that visit includes no changes. He reports good compliance with treatment. He is not having side effects.  He is exercising. He is adherent to low salt diet.   Outside blood pressures are not being checked. He is experiencing none.  Patient denies exertional chest pressure/discomfort, lower extremity edema and palpitations.   Cardiovascular risk factors include diabetes mellitus.     Weight trend: stable Wt Readings from Last 3 Encounters:  06/18/16  170 lb (77.1 kg)  02/27/16 175 lb (79.4 kg)  02/21/16 175 lb 3.2 oz (79.5 kg)    Current diet: well balanced         No Known Allergies   Current Outpatient Prescriptions:  .  allopurinol (ZYLOPRIM) 300 MG tablet, TAKE TWO TABLETS DAILY, Disp: 180 tablet, Rfl: 3 .  Cholecalciferol (VITAMIN D3) 2000 UNITS TABS, Take 1 tablet by mouth daily., Disp: , Rfl:  .  CINNAMON PO, Take 2,000 mg by mouth daily., Disp: , Rfl:  .  cyclobenzaprine (FLEXERIL) 10 MG tablet, TAKE ONE TABLET AT BEDTIME AS NEEDED BACK SPASM, Disp: 30 tablet, Rfl: 11 .  glucose blood (ONETOUCH VERIO) test strip, Check sugar once daily DX E11.9, Disp: 30 each, Rfl: 12 .  HYDROcodone-acetaminophen (NORCO/VICODIN) 5-325 MG tablet, Take 0.5-1 tablets by mouth 2 (two) times daily as needed (pain)., Disp: 60 tablet, Rfl: 0 .  losartan (COZAAR) 25 MG tablet, TAKE 1 TABLET EVERY DAY, Disp: 30 tablet, Rfl: 12 .  metFORMIN (GLUCOPHAGE XR) 500 MG 24 hr tablet, Take 1 tablet (500 mg total) by mouth 2 (two) times daily., Disp: 60 tablet, Rfl: 5 .  Omega-3 Fatty Acids (FISH OIL) 1000 MG CAPS, Take 1 capsule by mouth daily., Disp: , Rfl:  .  rivaroxaban (XARELTO) 20 MG TABS tablet, Take 1 tablet (20 mg total) by mouth daily with supper., Disp: 90 tablet, Rfl: 1 .  rosuvastatin (CRESTOR) 10 MG tablet, TAKE 1 TABLET EVERY DAY, Disp: 90 tablet, Rfl: 3 .  TURMERIC PO, Take by mouth., Disp: , Rfl:  .  vitamin B-12 (CYANOCOBALAMIN) 1000 MCG tablet, Take 1,000 mcg by mouth daily., Disp: , Rfl:  .  metoprolol (LOPRESSOR) 50 MG tablet, Take one-half tablet (25 mg) by mouth twice daily as needed for palpitations and/or fast heart rates (Patient not taking: Reported on 02/27/2016), Disp: , Rfl:  .  sildenafil (REVATIO) 20 MG tablet, 2 to 4 tablets daily as needed (Patient not taking: Reported on 02/27/2016), Disp: 50 tablet, Rfl: 5  Review of Systems  Constitutional: Negative.   Eyes: Negative.   Respiratory: Negative.   Cardiovascular:  Negative.   Endocrine: Negative.   Musculoskeletal: Negative.   Allergic/Immunologic: Negative.   Neurological: Negative.   Hematological: Negative.   Psychiatric/Behavioral: Positive for sleep disturbance.    Social History  Substance Use Topics  . Smoking status: Former Smoker    Types: Cigarettes    Quit date: 01/23/1971  . Smokeless tobacco: Never Used  . Alcohol use Yes     Comment: very occasionally drinks alcohol 2-3 times a week   Objective:   BP 122/78 (BP Location: Right Arm, Patient Position: Sitting, Cuff Size: Normal)   Pulse 68   Temp 98.5 F (36.9 C)   Resp 16   Wt 170 lb (77.1 kg)   SpO2 98%   BMI 25.10 kg/m  Vitals:   06/18/16 0815  BP: 122/78  Pulse: 68  Resp: 16  Temp: 98.5 F (36.9 C)  SpO2: 98%  Weight: 170 lb (77.1 kg)     Physical Exam  Constitutional: He is oriented to person, place, and time. He appears well-developed and well-nourished.  HENT:  Head: Normocephalic and atraumatic.  Right Ear: External ear normal.  Left Ear: External ear normal.  Nose: Nose normal.  Eyes: Conjunctivae are normal. No scleral icterus.  Neck: No thyromegaly present.  Cardiovascular: Normal rate, regular rhythm and normal heart sounds.   Pulmonary/Chest: Effort normal and breath sounds normal.  Abdominal: Soft.  Musculoskeletal: Normal range of motion. He exhibits no edema.  Neurological: He is alert and oriented to person, place, and time. No cranial nerve deficit. He exhibits normal muscle tone. Coordination normal.  Skin: Skin is warm and dry.  Psychiatric: He has a normal mood and affect. His behavior is normal. Judgment and thought content normal.        Assessment & Plan:     1. Type 2 diabetes mellitus with complication, without long-term current use of insulin (HCC)  - POCT glycosylated hemoglobin (Hb A1C)  2. HYPERTENSION, BENIGN   3. Other insomnia  - temazepam (RESTORIL) 30 MG capsule; Take 1 capsule (30 mg total) by mouth at bedtime  as needed for sleep.  Dispense: 30 capsule; Refill: 0 4.Gout 5.AFib      I have done the exam and reviewed the above chart and it is accurate to the best of my knowledge. Development worker, community has been used in this note in any air is in the dictation or transcription are unintentional.  Wilhemena Durie, MD  [place]

## 2016-06-24 ENCOUNTER — Other Ambulatory Visit: Payer: Self-pay | Admitting: Family Medicine

## 2016-06-24 ENCOUNTER — Other Ambulatory Visit: Payer: Self-pay

## 2016-06-24 DIAGNOSIS — E118 Type 2 diabetes mellitus with unspecified complications: Secondary | ICD-10-CM

## 2016-06-27 DIAGNOSIS — M9903 Segmental and somatic dysfunction of lumbar region: Secondary | ICD-10-CM | POA: Diagnosis not present

## 2016-06-27 DIAGNOSIS — M5431 Sciatica, right side: Secondary | ICD-10-CM | POA: Diagnosis not present

## 2016-06-27 DIAGNOSIS — M9905 Segmental and somatic dysfunction of pelvic region: Secondary | ICD-10-CM | POA: Diagnosis not present

## 2016-06-27 DIAGNOSIS — M5416 Radiculopathy, lumbar region: Secondary | ICD-10-CM | POA: Diagnosis not present

## 2016-06-28 DIAGNOSIS — Z1211 Encounter for screening for malignant neoplasm of colon: Secondary | ICD-10-CM | POA: Diagnosis not present

## 2016-06-28 DIAGNOSIS — I10 Essential (primary) hypertension: Secondary | ICD-10-CM | POA: Diagnosis not present

## 2016-07-02 ENCOUNTER — Telehealth: Payer: Self-pay | Admitting: Pharmacist

## 2016-07-02 NOTE — Telephone Encounter (Signed)
Received clearance request from Dr Gustavo Lah that pt is scheduled for a colonoscopy with request to hold Xarelto. Pt takes Xarelto for afib with CHADS2 score of 2 (HTN and DM). Ok to hold Xarelto for 24 hours prior per protocol. Clearance faxed to [phone].

## 2016-07-05 DIAGNOSIS — Z6825 Body mass index (BMI) 25.0-25.9, adult: Secondary | ICD-10-CM | POA: Diagnosis not present

## 2016-07-05 DIAGNOSIS — E663 Overweight: Secondary | ICD-10-CM | POA: Diagnosis not present

## 2016-07-05 DIAGNOSIS — M1A09X0 Idiopathic chronic gout, multiple sites, without tophus (tophi): Secondary | ICD-10-CM | POA: Diagnosis not present

## 2016-07-05 DIAGNOSIS — M150 Primary generalized (osteo)arthritis: Secondary | ICD-10-CM | POA: Diagnosis not present

## 2016-07-05 DIAGNOSIS — M25511 Pain in right shoulder: Secondary | ICD-10-CM | POA: Diagnosis not present

## 2016-07-12 ENCOUNTER — Telehealth: Payer: Self-pay | Admitting: Family Medicine

## 2016-07-12 ENCOUNTER — Ambulatory Visit (INDEPENDENT_AMBULATORY_CARE_PROVIDER_SITE_OTHER): Payer: PPO | Admitting: Family Medicine

## 2016-07-12 ENCOUNTER — Encounter: Payer: Self-pay | Admitting: Family Medicine

## 2016-07-12 VITALS — BP 128/64 | Temp 97.9°F | Resp 16 | Wt 168.0 lb

## 2016-07-12 DIAGNOSIS — R319 Hematuria, unspecified: Secondary | ICD-10-CM | POA: Diagnosis not present

## 2016-07-12 LAB — POCT URINALYSIS DIPSTICK
BILIRUBIN UA: NEGATIVE
GLUCOSE UA: NEGATIVE
KETONES UA: NEGATIVE
Leukocytes, UA: NEGATIVE
Nitrite, UA: NEGATIVE
Protein, UA: NEGATIVE
SPEC GRAV UA: 1.01 (ref 1.010–1.025)
UROBILINOGEN UA: 0.2 U/dL
pH, UA: 6.5 (ref 5.0–8.0)

## 2016-07-12 MED ORDER — CIPROFLOXACIN HCL 500 MG PO TABS: 500.0000 mg | ORAL_TABLET | Freq: Two times a day (BID) | ORAL | 0 refills | Status: AC

## 2016-07-12 NOTE — Telephone Encounter (Signed)
This has been addressed and patient is coming in to see [place] today-aa

## 2016-07-12 NOTE — Telephone Encounter (Signed)
Pt would like a nurse to call him back.  He is having blood in his urine.   It has just started.  No history of stones and no pain right now.  Thanks Con Memos

## 2016-07-12 NOTE — Progress Notes (Signed)
Patient: Benjamin [name] Male    DOB: 04-11-1950   66 y.o.   MRN: [id number] Visit Date: 07/12/2016  Today's Provider: Lelon Huh, MD   Chief Complaint  Patient presents with  . Hematuria   Subjective:    HPI Patient comes in today c/o hematuria since this morning. Patient denies any burning on urination, low back pain, flank pain, or abdominal pain. He states his urine was normal when he first voided this morning, but after returning from a long walk, he voided dark red urine. He void several more times throughout the day and color eventually returned to normal. Otherwise feels completely normal. No nausea or vomiting. No chest pain or dyspnea, no muscle pains. Patient reports that he has never had symptoms before in the past. He denies any injuries or doing any heavy lifting.     No Known Allergies   Current Outpatient Prescriptions:  .  allopurinol (ZYLOPRIM) 300 MG tablet, TAKE TWO TABLETS DAILY, Disp: 180 tablet, Rfl: 3 .  Cholecalciferol (VITAMIN D3) 2000 UNITS TABS, Take 1 tablet by mouth daily., Disp: , Rfl:  .  CINNAMON PO, Take 2,000 mg by mouth daily., Disp: , Rfl:  .  cyclobenzaprine (FLEXERIL) 10 MG tablet, TAKE ONE TABLET AT BEDTIME AS NEEDED BACK SPASM, Disp: 30 tablet, Rfl: 11 .  glucose blood (ONETOUCH VERIO) test strip, Check sugar once daily DX E11.9, Disp: 30 each, Rfl: 12 .  HYDROcodone-acetaminophen (NORCO/VICODIN) 5-325 MG tablet, Take 0.5-1 tablets by mouth 2 (two) times daily as needed (pain)., Disp: 60 tablet, Rfl: 0 .  losartan (COZAAR) 25 MG tablet, TAKE 1 TABLET EVERY DAY, Disp: 30 tablet, Rfl: 12 .  metFORMIN (GLUCOPHAGE-XR) 500 MG 24 hr tablet, TAKE 2 TABLETS BY MOTUH TWICE DAILY, Disp: 120 tablet, Rfl: 11 .  Omega-3 Fatty Acids (FISH OIL) 1000 MG CAPS, Take 1 capsule by mouth daily., Disp: , Rfl:  .  rivaroxaban (XARELTO) 20 MG TABS tablet, Take 1 tablet (20 mg total) by mouth daily with supper., Disp: 90 tablet, Rfl: 1 .  rosuvastatin (CRESTOR)  10 MG tablet, TAKE 1 TABLET EVERY DAY, Disp: 90 tablet, Rfl: 3 .  temazepam (RESTORIL) 30 MG capsule, Take 1 capsule (30 mg total) by mouth at bedtime as needed for sleep., Disp: 30 capsule, Rfl: 0 .  TURMERIC PO, Take by mouth., Disp: , Rfl:  .  vitamin B-12 (CYANOCOBALAMIN) 1000 MCG tablet, Take 1,000 mcg by mouth daily., Disp: , Rfl:  .  metoprolol (LOPRESSOR) 50 MG tablet, Take one-half tablet (25 mg) by mouth twice daily as needed for palpitations and/or fast heart rates (Patient not taking: Reported on 07/12/2016), Disp: , Rfl:  .  sildenafil (REVATIO) 20 MG tablet, 2 to 4 tablets daily as needed (Patient not taking: Reported on 07/12/2016), Disp: 50 tablet, Rfl: 5  Review of Systems  Constitutional: Negative.   Gastrointestinal: Negative.   Genitourinary: Negative.   Musculoskeletal: Negative.     Social History  Substance Use Topics  . Smoking status: Former Smoker    Types: Cigarettes    Quit date: 01/23/1971  . Smokeless tobacco: Never Used  . Alcohol use Yes     Comment: very occasionally drinks alcohol 2-3 times a week   Objective:   BP 128/64 (BP Location: Left Arm, Patient Position: Sitting, Cuff Size: Normal)   Temp 97.9 F (36.6 C)   Resp 16   Wt 168 lb (76.2 kg)   BMI 24.81 kg/m  Vitals:  07/12/16 1502  BP: 128/64  Resp: 16  Temp: 97.9 F (36.6 C)  Weight: 168 lb (76.2 kg)     Physical Exam  General Appearance:    Alert, cooperative, no distress  Eyes:    PERRL, conjunctiva/corneas clear, EOM's intact       Lungs:     Clear to auscultation bilaterally, respirations unlabored  Heart:    Regular rate and rhythm  Abdomen:   bowel sounds present and normal in all 4 quadrants. No CVA tenderness   Results for orders placed or performed in visit on 07/12/16  POCT urinalysis dipstick  Result Value Ref Range   Color, UA yellow    Clarity, UA clear    Glucose, UA negative    Bilirubin, UA negative    Ketones, UA negative    Spec Grav, UA 1.010 1.010 -  1.025   Blood, UA hemolyzed large    pH, UA 6.5 5.0 - 8.0   Protein, UA negative    Urobilinogen, UA 0.2 0.2 or 1.0 E.U./dL   Nitrite, UA negative    Leukocytes, UA Negative Negative        Assessment & Plan:     1. Hematuria, unspecified type No cells seen on microscopic exam. Send specimen to [facility] to confirm if this is microscopic hematuria or chemical hematuria. Cover with Cipro while awaiting results.  - POCT urinalysis dipstick - Urine culture - Urinalysis, microscopic only       Lelon Huh, MD  [facility]

## 2016-07-13 LAB — URINALYSIS, MICROSCOPIC ONLY
Casts: NONE SEEN /lpf
Epithelial Cells (non renal): NONE SEEN /hpf (ref 0–10)

## 2016-07-14 LAB — URINE CULTURE: ORGANISM ID, BACTERIA: NO GROWTH

## 2016-07-18 ENCOUNTER — Encounter: Payer: Self-pay | Admitting: Family Medicine

## 2016-07-18 ENCOUNTER — Ambulatory Visit (INDEPENDENT_AMBULATORY_CARE_PROVIDER_SITE_OTHER): Payer: PPO | Admitting: Family Medicine

## 2016-07-18 VITALS — BP 120/68 | HR 64 | Temp 97.4°F | Resp 16 | Wt 169.0 lb

## 2016-07-18 DIAGNOSIS — L255 Unspecified contact dermatitis due to plants, except food: Secondary | ICD-10-CM | POA: Diagnosis not present

## 2016-07-18 MED ORDER — FLUOCINONIDE 0.05 % EX OINT: 1.0000 "application " | TOPICAL_OINTMENT | Freq: Three times a day (TID) | CUTANEOUS | 0 refills | Status: DC

## 2016-07-18 NOTE — Progress Notes (Signed)
Subjective:     Patient ID: Benjamin [name], male   DOB: 04-29-1950, 66 y.o.   MRN: [id number]  HPI  Chief Complaint  Patient presents with  . Rash    Right lower leg is worse but has spread to left lower leg. Pt seems to think it is poison ivy or oak but has not been outside. He says one of his dogs could have brought it in. He says it started about a week ago and has been using Ivyrest on it but not any better. He says in the past when he gets it then it spreads bad.   States he has contact with his dogs when he is wearing shorts.   Review of Systems     Objective:   Physical Exam  Constitutional: He appears well-developed and well-nourished. No distress.  Skin:  Bilateral lower legs to feet R > L) with erythematous papules and plaques in a linear pattern with small overlying vesicles noted.       Assessment:    1. Contact dermatitis due to plants, except food, unspecified contact dermatitis type - fluocinonide ointment (LIDEX) 0.05 %; Apply 1 application topically 3 (three) times daily.  Dispense: 30 g; Refill: 0    Plan:    Discussed natural history of poison ivy. Keep clean with soap and water.

## 2016-07-18 NOTE — Patient Instructions (Signed)
Keep rash area clean with soap and water.

## 2016-07-25 DIAGNOSIS — M9903 Segmental and somatic dysfunction of lumbar region: Secondary | ICD-10-CM | POA: Diagnosis not present

## 2016-07-25 DIAGNOSIS — M5416 Radiculopathy, lumbar region: Secondary | ICD-10-CM | POA: Diagnosis not present

## 2016-07-25 DIAGNOSIS — M5431 Sciatica, right side: Secondary | ICD-10-CM | POA: Diagnosis not present

## 2016-07-25 DIAGNOSIS — M9905 Segmental and somatic dysfunction of pelvic region: Secondary | ICD-10-CM | POA: Diagnosis not present

## 2016-08-15 ENCOUNTER — Other Ambulatory Visit: Payer: Self-pay | Admitting: Internal Medicine

## 2016-08-16 DIAGNOSIS — M9903 Segmental and somatic dysfunction of lumbar region: Secondary | ICD-10-CM | POA: Diagnosis not present

## 2016-08-16 DIAGNOSIS — M9905 Segmental and somatic dysfunction of pelvic region: Secondary | ICD-10-CM | POA: Diagnosis not present

## 2016-08-16 DIAGNOSIS — M5416 Radiculopathy, lumbar region: Secondary | ICD-10-CM | POA: Diagnosis not present

## 2016-08-16 DIAGNOSIS — M5431 Sciatica, right side: Secondary | ICD-10-CM | POA: Diagnosis not present

## 2016-08-16 NOTE — Telephone Encounter (Signed)
Age 65 yrs Wt 76.7kg (07/18/2016 )  Saw Dr Allred 02/21/2016  10/26/2015 SrCr 0.91 10/26/2015 Hgb 14.3 HCT 41.0 CrCl 85.87 CrCl  Refill done for Xarelto 20 mg daily as requested

## 2016-09-12 DIAGNOSIS — M5416 Radiculopathy, lumbar region: Secondary | ICD-10-CM | POA: Diagnosis not present

## 2016-09-12 DIAGNOSIS — M9903 Segmental and somatic dysfunction of lumbar region: Secondary | ICD-10-CM | POA: Diagnosis not present

## 2016-09-12 DIAGNOSIS — M5431 Sciatica, right side: Secondary | ICD-10-CM | POA: Diagnosis not present

## 2016-09-12 DIAGNOSIS — M9905 Segmental and somatic dysfunction of pelvic region: Secondary | ICD-10-CM | POA: Diagnosis not present

## 2016-09-13 DIAGNOSIS — E119 Type 2 diabetes mellitus without complications: Secondary | ICD-10-CM | POA: Diagnosis not present

## 2016-09-13 LAB — HM DIABETES EYE EXAM

## 2016-10-01 ENCOUNTER — Encounter: Payer: Self-pay | Admitting: Family Medicine

## 2016-10-02 ENCOUNTER — Other Ambulatory Visit: Payer: Self-pay | Admitting: Family Medicine

## 2016-10-02 DIAGNOSIS — E1121 Type 2 diabetes mellitus with diabetic nephropathy: Secondary | ICD-10-CM

## 2016-10-10 DIAGNOSIS — M5416 Radiculopathy, lumbar region: Secondary | ICD-10-CM | POA: Diagnosis not present

## 2016-10-10 DIAGNOSIS — M9903 Segmental and somatic dysfunction of lumbar region: Secondary | ICD-10-CM | POA: Diagnosis not present

## 2016-10-10 DIAGNOSIS — M5431 Sciatica, right side: Secondary | ICD-10-CM | POA: Diagnosis not present

## 2016-10-10 DIAGNOSIS — M9905 Segmental and somatic dysfunction of pelvic region: Secondary | ICD-10-CM | POA: Diagnosis not present

## 2016-10-31 DIAGNOSIS — D044 Carcinoma in situ of skin of scalp and neck: Secondary | ICD-10-CM | POA: Diagnosis not present

## 2016-10-31 DIAGNOSIS — L578 Other skin changes due to chronic exposure to nonionizing radiation: Secondary | ICD-10-CM | POA: Diagnosis not present

## 2016-10-31 DIAGNOSIS — C4492 Squamous cell carcinoma of skin, unspecified: Secondary | ICD-10-CM

## 2016-10-31 DIAGNOSIS — L570 Actinic keratosis: Secondary | ICD-10-CM | POA: Diagnosis not present

## 2016-10-31 DIAGNOSIS — D485 Neoplasm of uncertain behavior of skin: Secondary | ICD-10-CM | POA: Diagnosis not present

## 2016-10-31 DIAGNOSIS — L719 Rosacea, unspecified: Secondary | ICD-10-CM | POA: Diagnosis not present

## 2016-10-31 DIAGNOSIS — L905 Scar conditions and fibrosis of skin: Secondary | ICD-10-CM | POA: Diagnosis not present

## 2016-10-31 HISTORY — DX: Squamous cell carcinoma of skin, unspecified: C44.92

## 2016-11-07 ENCOUNTER — Encounter: Payer: Self-pay | Admitting: *Deleted

## 2016-11-07 ENCOUNTER — Ambulatory Visit: Payer: PPO | Admitting: Anesthesiology

## 2016-11-07 ENCOUNTER — Encounter
Admission: RE | Disposition: A | Discharge status: Home / Self Care | Payer: Self-pay | Source: Ambulatory Visit | Attending: Gastroenterology

## 2016-11-07 ENCOUNTER — Ambulatory Visit
Admission: RE | Admit: 2016-11-07 | Discharge: 2016-11-07 | Disposition: A | Discharge status: Home / Self Care | Payer: PPO | Source: Ambulatory Visit | Attending: Gastroenterology | Admitting: Gastroenterology

## 2016-11-07 DIAGNOSIS — I10 Essential (primary) hypertension: Secondary | ICD-10-CM | POA: Insufficient documentation

## 2016-11-07 DIAGNOSIS — I739 Peripheral vascular disease, unspecified: Secondary | ICD-10-CM | POA: Diagnosis not present

## 2016-11-07 DIAGNOSIS — Z1211 Encounter for screening for malignant neoplasm of colon: Secondary | ICD-10-CM | POA: Diagnosis not present

## 2016-11-07 DIAGNOSIS — Z7901 Long term (current) use of anticoagulants: Secondary | ICD-10-CM | POA: Insufficient documentation

## 2016-11-07 DIAGNOSIS — I480 Paroxysmal atrial fibrillation: Secondary | ICD-10-CM | POA: Insufficient documentation

## 2016-11-07 DIAGNOSIS — E785 Hyperlipidemia, unspecified: Secondary | ICD-10-CM | POA: Insufficient documentation

## 2016-11-07 DIAGNOSIS — K640 First degree hemorrhoids: Secondary | ICD-10-CM | POA: Insufficient documentation

## 2016-11-07 DIAGNOSIS — K5730 Diverticulosis of large intestine without perforation or abscess without bleeding: Secondary | ICD-10-CM | POA: Insufficient documentation

## 2016-11-07 DIAGNOSIS — M109 Gout, unspecified: Secondary | ICD-10-CM | POA: Insufficient documentation

## 2016-11-07 DIAGNOSIS — Z7984 Long term (current) use of oral hypoglycemic drugs: Secondary | ICD-10-CM | POA: Insufficient documentation

## 2016-11-07 DIAGNOSIS — Z87891 Personal history of nicotine dependence: Secondary | ICD-10-CM | POA: Diagnosis not present

## 2016-11-07 DIAGNOSIS — Z79899 Other long term (current) drug therapy: Secondary | ICD-10-CM | POA: Insufficient documentation

## 2016-11-07 DIAGNOSIS — K5790 Diverticulosis of intestine, part unspecified, without perforation or abscess without bleeding: Secondary | ICD-10-CM | POA: Diagnosis not present

## 2016-11-07 DIAGNOSIS — E119 Type 2 diabetes mellitus without complications: Secondary | ICD-10-CM | POA: Diagnosis not present

## 2016-11-07 DIAGNOSIS — K648 Other hemorrhoids: Secondary | ICD-10-CM | POA: Diagnosis not present

## 2016-11-07 HISTORY — PX: COLONOSCOPY WITH PROPOFOL: SHX5780

## 2016-11-07 LAB — GLUCOSE, CAPILLARY: GLUCOSE-CAPILLARY: 180 mg/dL — AB (ref 65–99)

## 2016-11-07 SURGERY — COLONOSCOPY WITH PROPOFOL
Anesthesia: General

## 2016-11-07 MED ORDER — SODIUM CHLORIDE 0.9 % IV SOLN: INTRAVENOUS | Status: DC

## 2016-11-07 MED ORDER — PHENYLEPHRINE HCL 10 MG/ML IJ SOLN
INTRAMUSCULAR | Status: DC | PRN
  Administered 2016-11-07 (×2): 50 ug via INTRAVENOUS

## 2016-11-07 MED ORDER — MIDAZOLAM HCL 2 MG/2ML IJ SOLN
INTRAMUSCULAR | Status: DC | PRN
  Administered 2016-11-07: 2 mg via INTRAVENOUS

## 2016-11-07 MED ORDER — EPHEDRINE SULFATE 50 MG/ML IJ SOLN
INTRAMUSCULAR | Status: DC | PRN
  Administered 2016-11-07: 5 mg via INTRAVENOUS
  Administered 2016-11-07: 10 mg via INTRAVENOUS

## 2016-11-07 MED ORDER — EPHEDRINE SULFATE 50 MG/ML IJ SOLN
INTRAMUSCULAR | Status: AC
  Filled 2016-11-07: qty 1

## 2016-11-07 MED ORDER — PROPOFOL 500 MG/50ML IV EMUL
INTRAVENOUS | Status: AC
  Filled 2016-11-07: qty 50

## 2016-11-07 MED ORDER — SODIUM CHLORIDE 0.9 % IV SOLN
INTRAVENOUS | Status: DC
  Administered 2016-11-07: 1000 mL via INTRAVENOUS

## 2016-11-07 MED ORDER — MIDAZOLAM HCL 2 MG/2ML IJ SOLN
INTRAMUSCULAR | Status: AC
  Filled 2016-11-07: qty 2

## 2016-11-07 MED ORDER — FENTANYL CITRATE (PF) 100 MCG/2ML IJ SOLN
INTRAMUSCULAR | Status: AC
Start: 2016-11-07 — End: 2016-11-07
  Filled 2016-11-07: qty 2

## 2016-11-07 MED ORDER — PHENYLEPHRINE HCL 10 MG/ML IJ SOLN
INTRAMUSCULAR | Status: AC
  Filled 2016-11-07: qty 1

## 2016-11-07 MED ORDER — PROPOFOL 500 MG/50ML IV EMUL
INTRAVENOUS | Status: DC | PRN
  Administered 2016-11-07: 120 ug/kg/min via INTRAVENOUS

## 2016-11-07 MED ORDER — FENTANYL CITRATE (PF) 100 MCG/2ML IJ SOLN
INTRAMUSCULAR | Status: DC | PRN
  Administered 2016-11-07: 50 ug via INTRAVENOUS

## 2016-11-07 NOTE — Transfer of Care (Signed)
Immediate Anesthesia Transfer of Care Note  Patient: Benjamin [name]  Procedure(s) Performed: Procedure(s): COLONOSCOPY WITH PROPOFOL (N/A)  Patient Location: PACU  Anesthesia Type:General  Level of Consciousness: awake and sedated  Airway & Oxygen Therapy: Patient Spontanous Breathing and Patient connected to nasal cannula oxygen  Post-op Assessment: Report given to RN and Post -op Vital signs reviewed and stable  Post vital signs: Reviewed and stable  Last Vitals:  Vitals:   11/07/16 0749  BP: 136/83  Pulse: 72  Resp: 20  Temp: 36.6 C  SpO2: 100%    Last Pain:  Vitals:   11/07/16 0749  TempSrc: Tympanic         Complications: No apparent anesthesia complications

## 2016-11-07 NOTE — Anesthesia Post-op Follow-up Note (Signed)
Anesthesia QCDR form completed.        

## 2016-11-07 NOTE — Anesthesia Preprocedure Evaluation (Signed)
Anesthesia Evaluation  Patient identified by MRN, date of birth, ID band Patient awake    Reviewed: Allergy & Precautions, H&P , NPO status , Patient's Chart, lab work & pertinent test results, reviewed documented beta blocker date and time   Airway Mallampati: II   Neck ROM: full    Dental  (+) Teeth Intact   Pulmonary neg pulmonary ROS, former smoker,    Pulmonary exam normal        Cardiovascular Exercise Tolerance: Good hypertension, On Medications + Peripheral Vascular Disease  negative cardio ROS Normal cardiovascular examAtrial Fibrillation  Rhythm:regular Rate:Normal     Neuro/Psych negative neurological ROS  negative psych ROS   GI/Hepatic negative GI ROS, Neg liver ROS,   Endo/Other  negative endocrine ROSdiabetes  Renal/GU negative Renal ROS  negative genitourinary   Musculoskeletal   Abdominal   Peds  Hematology negative hematology ROS (+)   Anesthesia Other Findings Past Medical History: No date: Atrial fibrillation (HCC)     Comment:  paroxysmal No date: Cough syncope No date: Diabetes mellitus No date: Dyslipidemia No date: Gout No date: Hyperlipidemia No date: Hypertension No date: Hypopotassemia Past Surgical History: No date: TONSILLECTOMY AND ADENOIDECTOMY BMI    Body Mass Index:  24.07 kg/m     Reproductive/Obstetrics negative OB ROS                             Anesthesia Physical Anesthesia Plan  ASA: III  Anesthesia Plan: General   Post-op Pain Management:    Induction:   PONV Risk Score and Plan:   Airway Management Planned:   Additional Equipment:   Intra-op Plan:   Post-operative Plan:   Informed Consent: I have reviewed the patients History and Physical, chart, labs and discussed the procedure including the risks, benefits and alternatives for the proposed anesthesia with the patient or authorized representative who has indicated  his/her understanding and acceptance.   Dental Advisory Given  Plan Discussed with: CRNA  Anesthesia Plan Comments:         Anesthesia Quick Evaluation

## 2016-11-07 NOTE — H&P (Signed)
Outpatient short stay form Pre-procedure 11/07/2016 8:34 AM Lollie Sails MD  Primary Physician: Dr. Miguel Aschoff  Reason for visit:  Colonoscopy  History of present illness:  Patient is a 66 year old male presenting today as above. His last colonoscopy was about 10 years ago which was normal. He tolerated his prep well. He takes no aspirin products. He does take this around toe however he is stopped that for 3 days.    Current Facility-Administered Medications:  .  0.9 %  sodium chloride infusion, , Intravenous, Continuous, Lollie Sails, MD, Last Rate: 20 mL/hr at 11/07/16 0809, 1,000 mL at 11/07/16 0809 .  0.9 %  sodium chloride infusion, , Intravenous, Continuous, Lollie Sails, MD  Prescriptions Prior to Admission  Medication Sig Dispense Refill Last Dose  . allopurinol (ZYLOPRIM) 300 MG tablet TAKE TWO TABLETS DAILY 180 tablet 3 Past Week at Unknown time  . Cholecalciferol (VITAMIN D3) 2000 UNITS TABS Take 1 tablet by mouth daily.   Past Week at Unknown time  . CINNAMON PO Take 2,000 mg by mouth daily.   Past Week at Unknown time  . losartan (COZAAR) 25 MG tablet TAKE 1 TABLET BY MOUTH DAILY 30 tablet 11 Past Week at Unknown time  . metFORMIN (GLUCOPHAGE-XR) 500 MG 24 hr tablet TAKE 2 TABLETS BY MOTUH TWICE DAILY 120 tablet 11 Past Week at Unknown time  . metoprolol (LOPRESSOR) 50 MG tablet Take one-half tablet (25 mg) by mouth twice daily as needed for palpitations and/or fast heart rates   Not Taking  . Omega-3 Fatty Acids (FISH OIL) 1000 MG CAPS Take 1 capsule by mouth daily.   Taking  . rosuvastatin (CRESTOR) 10 MG tablet TAKE 1 TABLET EVERY DAY 90 tablet 3 Past Week at Unknown time  . sildenafil (REVATIO) 20 MG tablet 2 to 4 tablets daily as needed 50 tablet 5 Not Taking  . temazepam (RESTORIL) 30 MG capsule Take 1 capsule (30 mg total) by mouth at bedtime as needed for sleep. 30 capsule 0 Not Taking  . TURMERIC PO Take by mouth.   Past Week at Unknown time  .  vitamin B-12 (CYANOCOBALAMIN) 1000 MCG tablet Take 1,000 mcg by mouth daily.   Past Week at Unknown time  . XARELTO 20 MG TABS tablet TAKE 1 TABLET BY MOUTH DAILY WITH SUPPER 90 tablet 1 Past Week at Unknown time  . cyclobenzaprine (FLEXERIL) 10 MG tablet TAKE ONE TABLET AT BEDTIME AS NEEDED BACK SPASM (Patient not taking: Reported on 11/07/2016) 30 tablet 11 Not Taking at Unknown time  . fluocinonide ointment (LIDEX) 7.89 % Apply 1 application topically 3 (three) times daily. 30 g 0   . glucose blood (ONETOUCH VERIO) test strip Check sugar once daily DX E11.9 30 each 12 Taking  . HYDROcodone-acetaminophen (NORCO/VICODIN) 5-325 MG tablet Take 0.5-1 tablets by mouth 2 (two) times daily as needed (pain). (Patient not taking: Reported on 11/07/2016) 60 tablet 0 Not Taking at Unknown time     No Known Allergies   Past Medical History:  Diagnosis Date  . Atrial fibrillation (HCC)    paroxysmal  . Cough syncope   . Diabetes mellitus   . Dyslipidemia   . Gout   . Hyperlipidemia   . Hypertension   . Hypopotassemia     Review of systems:      Physical Exam    Heart and lungs: Regular rate and rhythm without rub or gallop, lungs are bilaterally clear    HEENT: Normocephalic atraumatic eyes  are anicteric    Other:     Pertinant exam for procedure: Soft nontender nondistended bowel sounds positive and normoactive.    Planned proceedures: Colonoscopy and indicated procedures. I have discussed the risks benefits and complications of procedures to include not limited to bleeding, infection, perforation and the risk of sedation and the patient wishes to proceed.    Lollie Sails, MD Gastroenterology 11/07/2016  8:34 AM

## 2016-11-07 NOTE — Op Note (Signed)
[facility] Gastroenterology Patient Name: Benjamin [name] Procedure Date: 11/07/2016 8:32 AM MRN: [id number] Account #: [id number] Date of Birth: 1950-05-31 Admit Type: Outpatient Age: 66 Room: [facility] ENDO ROOM 1 Gender: Male Note Status: Finalized Procedure:            Colonoscopy Indications:          Screening for colorectal malignant neoplasm Providers:            Lollie Sails, MD Referring MD:         Janine Ores. Rosanna Randy, MD (Referring MD) Medicines:            Monitored Anesthesia Care Complications:        No immediate complications. Procedure:            Pre-Anesthesia Assessment:                       - ASA Grade Assessment: III - A patient with severe                        systemic disease.                       After obtaining informed consent, the colonoscope was                        passed under direct vision. Throughout the procedure,                        the patient's blood pressure, pulse, and oxygen                        saturations were monitored continuously. The                        Colonoscope was introduced through the anus and                        advanced to the the cecum, identified by appendiceal                        orifice and ileocecal valve. The patient tolerated the                        procedure well. The quality of the bowel preparation                        was good. Findings:      Multiple small and large-mouthed diverticula were found in the sigmoid       colon and descending colon.      Non-bleeding internal hemorrhoids were found during retroflexion and       during anoscopy. The hemorrhoids were small and Grade I (internal       hemorrhoids that do not prolapse).      The digital rectal exam was normal. Pertinent negatives include Old well       healed scar in the posterior midline, possible old anal fissure. Impression:           - Diverticulosis in the sigmoid colon and in the   descending colon.                       -  Non-bleeding internal hemorrhoids.                       - No specimens collected. Recommendation:       - Discharge patient to home.                       - Repeat colonoscopy in 10 years for screening purposes. Procedure Code(s):    --- Professional ---                       [phone], Colonoscopy, flexible; diagnostic, including                        collection of specimen(s) by brushing or washing, when                        performed (separate procedure) Diagnosis Code(s):    --- Professional ---                       Z12.11, Encounter for screening for malignant neoplasm                        of colon                       K64.0, First degree hemorrhoids                       K57.30, Diverticulosis of large intestine without                        perforation or abscess without bleeding CPT copyright 2016 American Medical Association. All rights reserved. The codes documented in this report are preliminary and upon coder review may  be revised to meet current compliance requirements. Lollie Sails, MD 11/07/2016 9:04:50 AM This report has been signed electronically. Number of Addenda: 0 Note Initiated On: 11/07/2016 8:32 AM Scope Withdrawal Time: 0 hours 8 minutes 46 seconds  Total Procedure Duration: 0 hours 18 minutes 49 seconds       [facility]

## 2016-11-07 NOTE — Anesthesia Procedure Notes (Signed)
Performed by: COOK-MARTIN, Jeramie Scogin Pre-anesthesia Checklist: Patient identified, Emergency Drugs available, Suction available, Patient being monitored and Timeout performed Patient Re-evaluated:Patient Re-evaluated prior to induction Oxygen Delivery Method: Nasal cannula Preoxygenation: Pre-oxygenation with 100% oxygen Induction Type: IV induction Placement Confirmation: positive ETCO2 and CO2 detector       

## 2016-11-07 NOTE — Anesthesia Postprocedure Evaluation (Signed)
Anesthesia Post Note  Patient: Benjamin [name]  Procedure(s) Performed: Procedure(s) (LRB): COLONOSCOPY WITH PROPOFOL (N/A)  Patient location during evaluation: PACU Anesthesia Type: General Level of consciousness: awake and alert Pain management: pain level controlled Vital Signs Assessment: post-procedure vital signs reviewed and stable Respiratory status: spontaneous breathing, nonlabored ventilation, respiratory function stable and patient connected to nasal cannula oxygen Cardiovascular status: blood pressure returned to baseline and stable Postop Assessment: no apparent nausea or vomiting Anesthetic complications: no     Last Vitals:  Vitals:   11/07/16 0927 11/07/16 0937  BP: 114/70 114/78  Pulse: 67 69  Resp: 13 11  Temp:    SpO2: 99% 95%    Last Pain:  Vitals:   11/07/16 0907  TempSrc: Tympanic                 [name]

## 2016-11-08 ENCOUNTER — Encounter: Payer: Self-pay | Admitting: Gastroenterology

## 2016-11-14 DIAGNOSIS — M9903 Segmental and somatic dysfunction of lumbar region: Secondary | ICD-10-CM | POA: Diagnosis not present

## 2016-11-14 DIAGNOSIS — M5431 Sciatica, right side: Secondary | ICD-10-CM | POA: Diagnosis not present

## 2016-11-14 DIAGNOSIS — M5416 Radiculopathy, lumbar region: Secondary | ICD-10-CM | POA: Diagnosis not present

## 2016-11-14 DIAGNOSIS — M9905 Segmental and somatic dysfunction of pelvic region: Secondary | ICD-10-CM | POA: Diagnosis not present

## 2016-12-16 DIAGNOSIS — D044 Carcinoma in situ of skin of scalp and neck: Secondary | ICD-10-CM | POA: Diagnosis not present

## 2016-12-17 DIAGNOSIS — M79645 Pain in left finger(s): Secondary | ICD-10-CM | POA: Diagnosis not present

## 2016-12-17 DIAGNOSIS — E663 Overweight: Secondary | ICD-10-CM | POA: Diagnosis not present

## 2016-12-17 DIAGNOSIS — M1A09X0 Idiopathic chronic gout, multiple sites, without tophus (tophi): Secondary | ICD-10-CM | POA: Diagnosis not present

## 2016-12-17 DIAGNOSIS — M150 Primary generalized (osteo)arthritis: Secondary | ICD-10-CM | POA: Diagnosis not present

## 2016-12-17 DIAGNOSIS — Z6825 Body mass index (BMI) 25.0-25.9, adult: Secondary | ICD-10-CM | POA: Diagnosis not present

## 2016-12-17 DIAGNOSIS — M25511 Pain in right shoulder: Secondary | ICD-10-CM | POA: Diagnosis not present

## 2016-12-24 ENCOUNTER — Other Ambulatory Visit: Payer: Self-pay | Admitting: Family Medicine

## 2016-12-25 ENCOUNTER — Ambulatory Visit: Payer: PPO

## 2016-12-26 ENCOUNTER — Encounter: Payer: PPO | Admitting: Family Medicine

## 2016-12-26 DIAGNOSIS — M9905 Segmental and somatic dysfunction of pelvic region: Secondary | ICD-10-CM | POA: Diagnosis not present

## 2016-12-26 DIAGNOSIS — M5431 Sciatica, right side: Secondary | ICD-10-CM | POA: Diagnosis not present

## 2016-12-26 DIAGNOSIS — M9903 Segmental and somatic dysfunction of lumbar region: Secondary | ICD-10-CM | POA: Diagnosis not present

## 2016-12-26 DIAGNOSIS — M5416 Radiculopathy, lumbar region: Secondary | ICD-10-CM | POA: Diagnosis not present

## 2017-01-23 DIAGNOSIS — M9905 Segmental and somatic dysfunction of pelvic region: Secondary | ICD-10-CM | POA: Diagnosis not present

## 2017-01-23 DIAGNOSIS — M5431 Sciatica, right side: Secondary | ICD-10-CM | POA: Diagnosis not present

## 2017-01-23 DIAGNOSIS — M9903 Segmental and somatic dysfunction of lumbar region: Secondary | ICD-10-CM | POA: Diagnosis not present

## 2017-01-23 DIAGNOSIS — M5416 Radiculopathy, lumbar region: Secondary | ICD-10-CM | POA: Diagnosis not present

## 2017-02-05 DIAGNOSIS — E785 Hyperlipidemia, unspecified: Secondary | ICD-10-CM | POA: Insufficient documentation

## 2017-02-05 DIAGNOSIS — R054 Cough syncope: Secondary | ICD-10-CM | POA: Insufficient documentation

## 2017-02-05 DIAGNOSIS — R55 Syncope and collapse: Secondary | ICD-10-CM | POA: Insufficient documentation

## 2017-02-05 DIAGNOSIS — R05 Cough: Secondary | ICD-10-CM | POA: Insufficient documentation

## 2017-02-05 DIAGNOSIS — I10 Essential (primary) hypertension: Secondary | ICD-10-CM | POA: Insufficient documentation

## 2017-02-13 ENCOUNTER — Ambulatory Visit (INDEPENDENT_AMBULATORY_CARE_PROVIDER_SITE_OTHER): Payer: PPO

## 2017-02-13 ENCOUNTER — Ambulatory Visit (INDEPENDENT_AMBULATORY_CARE_PROVIDER_SITE_OTHER): Payer: PPO | Admitting: Family Medicine

## 2017-02-13 VITALS — BP 132/74 | HR 60 | Temp 97.6°F | Ht 69.0 in | Wt 173.6 lb

## 2017-02-13 VITALS — BP 132/74 | HR 60 | Temp 97.6°F | Resp 16 | Ht 69.0 in | Wt 173.6 lb

## 2017-02-13 DIAGNOSIS — I481 Persistent atrial fibrillation: Secondary | ICD-10-CM

## 2017-02-13 DIAGNOSIS — E118 Type 2 diabetes mellitus with unspecified complications: Secondary | ICD-10-CM | POA: Diagnosis not present

## 2017-02-13 DIAGNOSIS — I10 Essential (primary) hypertension: Secondary | ICD-10-CM

## 2017-02-13 DIAGNOSIS — I4819 Other persistent atrial fibrillation: Secondary | ICD-10-CM

## 2017-02-13 DIAGNOSIS — Z0000 Encounter for general adult medical examination without abnormal findings: Secondary | ICD-10-CM | POA: Diagnosis not present

## 2017-02-13 DIAGNOSIS — N529 Male erectile dysfunction, unspecified: Secondary | ICD-10-CM | POA: Diagnosis not present

## 2017-02-13 DIAGNOSIS — E785 Hyperlipidemia, unspecified: Secondary | ICD-10-CM | POA: Diagnosis not present

## 2017-02-13 DIAGNOSIS — M109 Gout, unspecified: Secondary | ICD-10-CM

## 2017-02-13 NOTE — Patient Instructions (Signed)
Benjamin [name] , Thank you for taking time to come for your Medicare Wellness Visit. I appreciate your ongoing commitment to your health goals. Please review the following plan we discussed and let me know if I can assist you in the future.   Screening recommendations/referrals: Colonoscopy: Up to date Recommended yearly ophthalmology/optometry visit for glaucoma screening and checkup Recommended yearly dental visit for hygiene and checkup  Vaccinations: Influenza vaccine: Up to date Pneumococcal vaccine: Up to date Tdap vaccine: Up to date Shingles vaccine: Pt has received the Zostavax and declined Shingrix today. Pt to check with insurance and pharmacy first.   Advanced directives: Please bring a copy of your POA (Power of [place]) and/or Living Will to your next appointment.   Conditions/risks identified: Recommend to start back at the gym for 3 days a week for 45 minutes. (previous regimen)  Next appointment: 9:30 AM today  Preventive Care 65 Years and Older, Male Preventive care refers to lifestyle choices and visits with your health care provider that can promote health and wellness. What does preventive care include?  A yearly physical exam. This is also called an annual well check.  Dental exams once or twice a year.  Routine eye exams. Ask your health care provider how often you should have your eyes checked.  Personal lifestyle choices, including:  Daily care of your teeth and gums.  Regular physical activity.  Eating a healthy diet.  Avoiding tobacco and drug use.  Limiting alcohol use.  Practicing safe sex.  Taking low doses of aspirin every day.  Taking vitamin and mineral supplements as recommended by your health care provider. What happens during an annual well check? The services and screenings done by your health care provider during your annual well check will depend on your age, overall health, lifestyle risk factors, and family history of  disease. Counseling  Your health care provider may ask you questions about your:  Alcohol use.  Tobacco use.  Drug use.  Emotional well-being.  Home and relationship well-being.  Sexual activity.  Eating habits.  History of falls.  Memory and ability to understand (cognition).  Work and work Statistician. Screening  You may have the following tests or measurements:  Height, weight, and BMI.  Blood pressure.  Lipid and cholesterol levels. These may be checked every 5 years, or more frequently if you are over 36 years old.  Skin check.  Lung cancer screening. You may have this screening every year starting at age 18 if you have a 30-pack-year history of smoking and currently smoke or have quit within the past 15 years.  Fecal occult blood test (FOBT) of the stool. You may have this test every year starting at age 31.  Flexible sigmoidoscopy or colonoscopy. You may have a sigmoidoscopy every 5 years or a colonoscopy every 10 years starting at age 54.  Prostate cancer screening. Recommendations will vary depending on your family history and other risks.  Hepatitis C blood test.  Hepatitis B blood test.  Sexually transmitted disease (STD) testing.  Diabetes screening. This is done by checking your blood sugar (glucose) after you have not eaten for a while (fasting). You may have this done every 1-3 years.  Abdominal aortic aneurysm (AAA) screening. You may need this if you are a current or former smoker.  Osteoporosis. You may be screened starting at age 75 if you are at high risk. Talk with your health care provider about your test results, treatment options, and if necessary, the need for  more tests. Vaccines  Your health care provider may recommend certain vaccines, such as:  Influenza vaccine. This is recommended every year.  Tetanus, diphtheria, and acellular pertussis (Tdap, Td) vaccine. You may need a Td booster every 10 years.  Zoster vaccine. You may  need this after age 24.  Pneumococcal 13-valent conjugate (PCV13) vaccine. One dose is recommended after age 43.  Pneumococcal polysaccharide (PPSV23) vaccine. One dose is recommended after age 47. Talk to your health care provider about which screenings and vaccines you need and how often you need them. This information is not intended to replace advice given to you by your health care provider. Make sure you discuss any questions you have with your health care provider. Document Released: 02/24/2015 Document Revised: 10/18/2015 Document Reviewed: 11/29/2014 Elsevier Interactive Patient Education  2017 [place] Prevention in the Home Falls can cause injuries. They can happen to people of all ages. There are many things you can do to make your home safe and to help prevent falls. What can I do on the outside of my home?  Regularly fix the edges of walkways and driveways and fix any cracks.  Remove anything that might make you trip as you walk through a door, such as a raised step or threshold.  Trim any bushes or trees on the path to your home.  Use bright outdoor lighting.  Clear any walking paths of anything that might make someone trip, such as rocks or tools.  Regularly check to see if handrails are loose or broken. Make sure that both sides of any steps have handrails.  Any raised decks and porches should have guardrails on the edges.  Have any leaves, snow, or ice cleared regularly.  Use sand or salt on walking paths during winter.  Clean up any spills in your garage right away. This includes oil or grease spills. What can I do in the bathroom?  Use night lights.  Install grab bars by the toilet and in the tub and shower. Do not use towel bars as grab bars.  Use non-skid mats or decals in the tub or shower.  If you need to sit down in the shower, use a plastic, non-slip stool.  Keep the floor dry. Clean up any water that spills on the floor as soon as it  happens.  Remove soap buildup in the tub or shower regularly.  Attach bath mats securely with double-sided non-slip rug tape.  Do not have throw rugs and other things on the floor that can make you trip. What can I do in the bedroom?  Use night lights.  Make sure that you have a light by your bed that is easy to reach.  Do not use any sheets or blankets that are too big for your bed. They should not hang down onto the floor.  Have a firm chair that has side arms. You can use this for support while you get dressed.  Do not have throw rugs and other things on the floor that can make you trip. What can I do in the kitchen?  Clean up any spills right away.  Avoid walking on wet floors.  Keep items that you use a lot in easy-to-reach places.  If you need to reach something above you, use a strong step stool that has a grab bar.  Keep electrical cords out of the way.  Do not use floor polish or wax that makes floors slippery. If you must use wax, use non-skid  floor wax.  Do not have throw rugs and other things on the floor that can make you trip. What can I do with my stairs?  Do not leave any items on the stairs.  Make sure that there are handrails on both sides of the stairs and use them. Fix handrails that are broken or loose. Make sure that handrails are as long as the stairways.  Check any carpeting to make sure that it is firmly attached to the stairs. Fix any carpet that is loose or worn.  Avoid having throw rugs at the top or bottom of the stairs. If you do have throw rugs, attach them to the floor with carpet tape.  Make sure that you have a light switch at the top of the stairs and the bottom of the stairs. If you do not have them, ask someone to add them for you. What else can I do to help prevent falls?  Wear shoes that:  Do not have high heels.  Have rubber bottoms.  Are comfortable and fit you well.  Are closed at the toe. Do not wear sandals.  If you  use a stepladder:  Make sure that it is fully opened. Do not climb a closed stepladder.  Make sure that both sides of the stepladder are locked into place.  Ask someone to hold it for you, if possible.  Clearly mark and make sure that you can see:  Any grab bars or handrails.  First and last steps.  Where the edge of each step is.  Use tools that help you move around (mobility aids) if they are needed. These include:  Canes.  Walkers.  Scooters.  Crutches.  Turn on the lights when you go into a dark area. Replace any light bulbs as soon as they burn out.  Set up your furniture so you have a clear path. Avoid moving your furniture around.  If any of your floors are uneven, fix them.  If there are any pets around you, be aware of where they are.  Review your medicines with your doctor. Some medicines can make you feel dizzy. This can increase your chance of falling. Ask your doctor what other things that you can do to help prevent falls. This information is not intended to replace advice given to you by your health care provider. Make sure you discuss any questions you have with your health care provider. Document Released: 11/24/2008 Document Revised: 07/06/2015 Document Reviewed: 03/04/2014 Elsevier Interactive Patient Education  2017 [employer].

## 2017-02-13 NOTE — Progress Notes (Signed)
Patient: Benjamin [name] Male    DOB: 01-16-1951   67 y.o.   MRN: [id number] Visit Date: 02/13/2017  Today's Provider: Wilhemena Durie, MD   Chief Complaint  Patient presents with  . Diabetes  . Hypertension   Subjective:    HPI  Colonoscopy- 11/07/16 [place] Diverticulosis, non bleeding internal hemorrhoids, repeat 10 years   Diabetes Mellitus Type II, Follow-up:   Lab Results  Component Value Date   HGBA1C 7.5 06/18/2016   HGBA1C 7.3 02/27/2016   HGBA1C 8.2 10/19/2015    Last seen for diabetes 6 months ago.  Management since then includes none. He reports good compliance with treatment. He is not having side effects.  Current symptoms include none and numbness in fingers during cold weather and have been unchanged. Home blood sugar records: 160-220's  Episodes of hypoglycemia? no   Current Insulin Regimen: n/a Most Recent Eye Exam: 09/13/16 Weight trend: stable Current exercise: walking  Pertinent Labs:    Component Value Date/Time   CHOL 132 10/26/2015 0827   TRIG 314 (H) 10/26/2015 0827   HDL 34 (L) 10/26/2015 0827   LDLCALC 35 10/26/2015 0827   CREATININE 0.91 10/26/2015 0827    Wt Readings from Last 3 Encounters:  02/13/17 173 lb 9.6 oz (78.7 kg)  02/13/17 173 lb 9.6 oz (78.7 kg)  11/07/16 163 lb (73.9 kg)    ------------------------------------------------------------------------   Hypertension, follow-up:  BP Readings from Last 3 Encounters:  02/13/17 132/74  02/13/17 132/74  11/07/16 114/78    He was last seen for hypertension 6 months ago.  BP at that visit was 114/78. Management since that visit includes none. He reports good compliance with treatment. He is not having side effects.  He is exercising. He is adherent to low salt diet.   Outside blood pressures are 120's/60's. Patient denies chest pain, chest pressure/discomfort, claudication, dyspnea, exertional chest pressure/discomfort, fatigue, irregular heart beat, lower  extremity edema, near-syncope, orthopnea, palpitations, paroxysmal nocturnal dyspnea, syncope and tachypnea.   Cardiovascular risk factors include advanced age (older than 53 for men, 25 for women), diabetes mellitus, dyslipidemia, hypertension and male gender.   Wt Readings from Last 3 Encounters:  02/13/17 173 lb 9.6 oz (78.7 kg)  02/13/17 173 lb 9.6 oz (78.7 kg)  11/07/16 163 lb (73.9 kg)   ------------------------------------------------------------------------       No Known Allergies   Current Outpatient Medications:  .  allopurinol (ZYLOPRIM) 300 MG tablet, TAKE 2 TABLETS BY MOUTH DAILY, Disp: 180 tablet, Rfl: 3 .  Cholecalciferol (VITAMIN D3) 2000 UNITS TABS, Take 1 tablet by mouth daily., Disp: , Rfl:  .  CINNAMON PO, Take 2,000 mg by mouth daily., Disp: , Rfl:  .  glucose blood (ONETOUCH VERIO) test strip, Check sugar once daily DX E11.9, Disp: 30 each, Rfl: 12 .  HYDROcodone-acetaminophen (NORCO/VICODIN) 5-325 MG tablet, Take 0.5-1 tablets by mouth 2 (two) times daily as needed (pain)., Disp: 60 tablet, Rfl: 0 .  losartan (COZAAR) 25 MG tablet, TAKE 1 TABLET BY MOUTH DAILY, Disp: 30 tablet, Rfl: 11 .  metFORMIN (GLUCOPHAGE-XR) 500 MG 24 hr tablet, TAKE 2 TABLETS BY MOTUH TWICE DAILY, Disp: 120 tablet, Rfl: 11 .  metoprolol (LOPRESSOR) 50 MG tablet, Take one-half tablet (25 mg) by mouth twice daily as needed for palpitations and/or fast heart rates, Disp: , Rfl:  .  Omega-3 Fatty Acids (FISH OIL) 1000 MG CAPS, Take 1 capsule by mouth daily., Disp: , Rfl:  .  rosuvastatin (  CRESTOR) 10 MG tablet, TAKE 1 TABLET EVERY DAY, Disp: 90 tablet, Rfl: 3 .  TURMERIC PO, Take by mouth., Disp: , Rfl:  .  vitamin B-12 (CYANOCOBALAMIN) 1000 MCG tablet, Take 1,000 mcg by mouth daily., Disp: , Rfl:  .  XARELTO 20 MG TABS tablet, TAKE 1 TABLET BY MOUTH DAILY WITH SUPPER, Disp: 90 tablet, Rfl: 1  Review of Systems  Constitutional: Negative.   HENT: Negative.   Eyes: Negative.   Respiratory:  Negative.   Cardiovascular: Negative.   Gastrointestinal: Negative.   Endocrine: Negative.   Genitourinary: Negative.   Musculoskeletal: Positive for arthralgias and back pain.  Skin: Negative.   Allergic/Immunologic: Negative.   Neurological: Negative.   Hematological: Negative.   Psychiatric/Behavioral: Negative.     Social History   Tobacco Use  . Smoking status: Former Smoker    Types: Cigarettes    Last attempt to quit: 01/23/1971    Years since quitting: 46.0  . Smokeless tobacco: Never Used  Substance Use Topics  . Alcohol use: Yes    Alcohol/week: 2.4 oz    Types: 4 Glasses of wine per week    Comment: only on weekends   Objective:   BP 132/74   Pulse 60   Temp 97.6 F (36.4 C) (Oral)   Resp 16   Ht 5\' 9"  (1.753 m)   Wt 173 lb 9.6 oz (78.7 kg)   BMI 25.64 kg/m  Vitals:   02/13/17 0931  BP: 132/74  Pulse: 60  Resp: 16  Temp: 97.6 F (36.4 C)  TempSrc: Oral  Weight: 173 lb 9.6 oz (78.7 kg)  Height: 5\' 9"  (1.753 m)     Physical Exam  Constitutional: He is oriented to person, place, and time. He appears well-developed and well-nourished.  HENT:  Head: Normocephalic and atraumatic.  Right Ear: External ear normal.  Left Ear: External ear normal.  Nose: Nose normal.  Mouth/Throat: Oropharynx is clear and moist.  Eyes: Conjunctivae and EOM are normal. Pupils are equal, round, and reactive to light.  Neck: Normal range of motion. Neck supple.  Cardiovascular: Normal rate, regular rhythm, normal heart sounds and intact distal pulses.  Pulmonary/Chest: Effort normal and breath sounds normal.  Abdominal: Soft. Bowel sounds are normal.  Musculoskeletal: Normal range of motion.  Neurological: He is alert and oriented to person, place, and time. He has normal reflexes.  Skin: Skin is warm and dry.  Foot exam normal  Psychiatric: He has a normal mood and affect. His behavior is normal. Judgment and thought content normal.    Diabetic Foot Exam - Simple     Simple Foot Form Diabetic Foot exam was performed with the following findings:  Yes 02/13/2017  9:45 AM  Visual Inspection No deformities, no ulcerations, no other skin breakdown bilaterally:  Yes Sensation Testing Intact to touch and monofilament testing bilaterally:  Yes Pulse Check Posterior Tibialis and Dorsalis pulse intact bilaterally:  Yes Comments         Assessment & Plan:     1. Type 2 diabetes mellitus with complication, without long-term current use of insulin (HCC)  - Hemoglobin A1c  2. HYPERTENSION, BENIGN  - CBC with Differential/Platelet - TSH  3. Hyperlipidemia, unspecified hyperlipidemia type  - Lipid panel - Comprehensive metabolic panel  4. Gout, unspecified cause, unspecified chronicity, unspecified site  - Uric acid 5.Afib    HPI, Exam, and A&P Transcribed under the direction and in the presence of Laurissa Cowper L. Cranford Mon, MD  Electronically Signed: Marye Round  O'Dell, CMA  I have done the exam and reviewed the above chart and it is accurate to the best of my knowledge. Development worker, community has been used in this note in any air is in the dictation or transcription are unintentional.  Wilhemena Durie, MD  [place]

## 2017-02-13 NOTE — Progress Notes (Signed)
Subjective:   Benjamin [name] is a 67 y.o. male who presents for an Initial Medicare Annual Wellness Visit.  Review of Systems  N/A  Cardiac Risk Factors include: advanced age (>80men, >72 women);male gender;diabetes mellitus;dyslipidemia;hypertension    Objective:    Today's Vitals   02/13/17 0857  BP: 132/74  Pulse: 60  Temp: 97.6 F (36.4 C)  TempSrc: Oral  Weight: 173 lb 9.6 oz (78.7 kg)  Height: 5\' 9"  (1.753 m)  PainSc: 0-No pain   Body mass index is 25.64 kg/m.  Advanced Directives 02/13/2017 11/07/2016 01/30/2015 01/30/2015  Does Patient Have a Medical Advance Directive? Yes Yes Yes No  Type of Paramedic of [place];Living will Living will [place];Living will -  Copy of [place] in Chart? No - copy requested - - -    Current Medications (verified) Outpatient Encounter Medications as of 02/13/2017  Medication Sig  . allopurinol (ZYLOPRIM) 300 MG tablet TAKE 2 TABLETS BY MOUTH DAILY  . Cholecalciferol (VITAMIN D3) 2000 UNITS TABS Take 1 tablet by mouth daily.  Marland Kitchen CINNAMON PO Take 2,000 mg by mouth daily.  Marland Kitchen glucose blood (ONETOUCH VERIO) test strip Check sugar once daily DX E11.9  . HYDROcodone-acetaminophen (NORCO/VICODIN) 5-325 MG tablet Take 0.5-1 tablets by mouth 2 (two) times daily as needed (pain).  Marland Kitchen losartan (COZAAR) 25 MG tablet TAKE 1 TABLET BY MOUTH DAILY  . metFORMIN (GLUCOPHAGE-XR) 500 MG 24 hr tablet TAKE 2 TABLETS BY [facility] TWICE DAILY  . metoprolol (LOPRESSOR) 50 MG tablet Take one-half tablet (25 mg) by mouth twice daily as needed for palpitations and/or fast heart rates  . Omega-3 Fatty Acids (FISH OIL) 1000 MG CAPS Take 1 capsule by mouth daily.  . rosuvastatin (CRESTOR) 10 MG tablet TAKE 1 TABLET EVERY DAY  . TURMERIC PO Take by mouth.  . vitamin B-12 (CYANOCOBALAMIN) 1000 MCG tablet Take 1,000 mcg by mouth daily.  Alveda Reasons 20 MG TABS tablet TAKE 1 TABLET BY MOUTH DAILY WITH SUPPER    . [DISCONTINUED] cyclobenzaprine (FLEXERIL) 10 MG tablet TAKE ONE TABLET AT BEDTIME AS NEEDED BACK SPASM (Patient not taking: Reported on 11/07/2016)  . [DISCONTINUED] fluocinonide ointment (LIDEX) 3.61 % Apply 1 application topically 3 (three) times daily. (Patient not taking: Reported on 02/13/2017)  . [DISCONTINUED] sildenafil (REVATIO) 20 MG tablet 2 to 4 tablets daily as needed  . [DISCONTINUED] temazepam (RESTORIL) 30 MG capsule Take 1 capsule (30 mg total) by mouth at bedtime as needed for sleep.   No facility-administered encounter medications on file as of 02/13/2017.     Allergies (verified) Patient has no known allergies.   History: Past Medical History:  Diagnosis Date  . Atrial fibrillation (HCC)    paroxysmal  . Cancer ([place])    SCC removed from neck  . Cough syncope   . Diabetes mellitus   . Dyslipidemia   . Gout   . Hyperlipidemia   . Hypertension   . Hypopotassemia    Past Surgical History:  Procedure Laterality Date  . COLONOSCOPY WITH PROPOFOL N/A 11/07/2016   Procedure: COLONOSCOPY WITH PROPOFOL;  Surgeon: Lollie Sails, MD;  Location: [facility] ENDOSCOPY;  Service: Endoscopy;  Laterality: N/A;  . TONSILLECTOMY AND ADENOIDECTOMY     Family History  Problem Relation Age of Onset  . Atrial fibrillation Mother   . Ovarian cancer Mother   . Congestive Heart Failure Mother   . Aneurysm Mother   . Heart attack Father   .  Diabetes Father   . Hypertension Father   . Ulcers Father   . Gout Father   . CAD Father   . Other Son        ITP  . Gout Son   . Other Son        ITP  . Hearing loss Son    Social History   Socioeconomic History  . Marital status: Married    Spouse name: Baldo Ash  . Number of children: 2  . Years of education: 16  . Highest education level: Bachelor's degree (e.g., BA, AB, BS)  Social Needs  . Financial resource strain: Not hard at all  . Food insecurity - worry: Never true  . Food insecurity - inability: Never true  .  Transportation needs - medical: No  . Transportation needs - non-medical: No  Occupational History  . 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 Smoking status: Former Smoker    Types: Cigarettes    Last attempt to quit: 01/23/1971    Years since quitting: 46.0  . Smokeless tobacco: Never Used  Substance and Sexual Activity  . Alcohol use: Yes    Alcohol/week: 2.4 oz    Types: 4 Glasses of wine per week    Comment: only on weekends  . Drug use: No  . Sexual activity: Yes  Other Topics Concern  . None  Social History Narrative    SOCIAL HISTORY:  He lives in [place] with his wife.  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  He has 2 adult sons.  He exercises as stated above.  No tobacco or illicit substance use.  He does enjoy occasional beer and vodka, mixed drink on the weekends.  He tries to follow a low-cholesterol diet.          FAMILY HISTORY:  Mother is alive, she has a pacemaker secondary to heart block, she also has a stable abdominal aneurysm.  Father's health, interesting that he had an MI at age 73 and then apparently died in his sleep at age 62 that was felt to be secondary to MI.  He has 3 siblings who are alive and well.    Tobacco Counseling Counseling given: Not Answered   Clinical Intake:  Pre-visit preparation completed: Yes  Pain : No/denies pain Pain Score: 0-No pain     Nutritional Status: BMI 25 -29 Overweight Nutritional Risks: None Diabetes: Yes CBG done?: No Did pt. bring in CBG monitor from home?: No  How often do you need to have someone help you when you read instructions, pamphlets, or other written materials from your doctor or pharmacy?: 1 - Never  Interpreter Needed?: No  Information entered by :: [facility], LPN  Activities of Daily Living In your present state of health, do you have any difficulty performing the following activities: 02/13/2017  Hearing? N  Vision? N  Difficulty  concentrating or making decisions? N  Walking or climbing stairs? N  Dressing or bathing? N  Doing errands, shopping? N  Preparing Food and eating ? N  Using the Toilet? N  In the past six months, have you accidently leaked urine? N  Do you have problems with loss of bowel control? N  Managing your Medications? N  Managing your Finances? N  Housekeeping or managing your Housekeeping? N  Some recent data might be hidden     Immunizations and Health Maintenance Immunization History  Administered Date(s) Administered  . Influenza, High Dose  Seasonal PF 10/19/2015  . Influenza-Unspecified 11/12/2014  . Pneumococcal Conjugate-13 09/13/2014  . Pneumococcal Polysaccharide-23 02/06/2000, 06/07/2009  . Td 11/10/2003  . Tdap 10/10/2010  . Zoster 11/14/2010   Health Maintenance Due  Topic Date Due  . FOOT EXAM  06/19/1959  . HEMOGLOBIN A1C  12/19/2016    Patient Care Team: Jerrol Banana., MD as PCP - General (Family Medicine) Birder Robson, MD as Referring Physician (Ophthalmology) Thompson Grayer, MD as Consulting Physician (Cardiology) Ralene Bathe, MD as Consulting Physician (Dermatology) Prudencio Pair as Physician Assistant (Emergency Medicine)  Indicate any recent Medical Services you may have received from other than [facility] providers in the past year (date may be approximate).    Assessment:   This is a routine wellness examination for [place].  Hearing/Vision screen Vision Screening Comments: Pt sees Dr George Ina for vision checks yearly.   Dietary issues and exercise activities discussed: Current Exercise Habits: Home exercise routine, Type of exercise: walking, Time (Minutes): 35, Frequency (Times/Week): 3(to 4 days a week), Weekly Exercise (Minutes/Week): 105, Intensity: Mild  Goals    . Increase Exercise     Recommend to start back at the gym for 3 days a week for 45 minutes. (previous regimen)      Depression Screen PHQ 2/9 Scores  02/13/2017 02/13/2017 10/11/2015 09/13/2014  PHQ - 2 Score 0 0 0 0  PHQ- 9 Score 2 - - -    Fall Risk Fall Risk  02/13/2017 10/11/2015 09/13/2014  Falls in the past year? No No Yes    Is the patient's home free of loose throw rugs in walkways, pet beds, electrical cords, etc?   yes      Grab bars in the bathroom? No, not needed at this time per pt      Handrails on the stairs?   yes      Adequate lighting?   yes  Timed Get Up and Go performed: N/A  Cognitive Function: Pt declined screening today.        Screening Tests Health Maintenance  Topic Date Due  . FOOT EXAM  06/19/1959  . HEMOGLOBIN A1C  12/19/2016  . PNA vac Low Risk Adult (2 of 2 - PPSV23) 09/13/2019 (Originally 09/13/2015)  . OPHTHALMOLOGY EXAM  09/13/2017  . TETANUS/TDAP  10/09/2020  . COLONOSCOPY  11/08/2026  . INFLUENZA VACCINE  Completed  . Hepatitis C Screening  Completed    Qualifies for Shingles Vaccine? Due for Shingles vaccine. Declined my offer to administer today. Education has been provided regarding the importance of this vaccine. Pt has been advised to call her insurance company to determine her out of pocket expense. Advised she may also receive this vaccine at her local pharmacy or Health Dept. Verbalized acceptance and understanding.  Cancer Screenings: Lung: Low Dose CT Chest recommended if Age 75-80 years, 30 pack-year currently smoking OR have quit w/in 15years. Patient does not qualify. Colorectal: Up to date  Additional Screenings:  Hepatitis B/HIV/Syphillis: Pt declines today.  Hepatitis C Screening: Up to date     Plan:  I have personally reviewed and addressed the Medicare Annual Wellness questionnaire and have noted the following in the patient's chart:  A. Medical and social history B. Use of alcohol, tobacco or illicit drugs  C. Current medications and supplements D. Functional ability and status E.  Nutritional status F.  Physical activity G. Advance directives H. List of other  physicians I.  Hospitalizations, surgeries, and ER visits in previous 12 months J.  Vitals K. Screenings such as hearing and vision if needed, cognitive and depression L. Referrals and appointments - none  In addition, I have reviewed and discussed with patient certain preventive protocols, quality metrics, and best practice recommendations. A written personalized care plan for preventive services as well as general preventive health recommendations were provided to patient.  See attached scanned questionnaire for additional information.   Signed,  Fabio Neighbors, LPN Nurse Health Advisor   Nurse Recommendations: Pt needs a diabetic foot exam and his Hgb A1c checked today at [facility].

## 2017-02-14 LAB — CBC WITH DIFFERENTIAL/PLATELET
BASOS: 0 %
Basophils Absolute: 0 10*3/uL (ref 0.0–0.2)
EOS (ABSOLUTE): 0.1 10*3/uL (ref 0.0–0.4)
Eos: 2 %
Hematocrit: 41.4 % (ref 37.5–51.0)
Hemoglobin: 14.1 g/dL (ref 13.0–17.7)
IMMATURE GRANULOCYTES: 0 %
Immature Grans (Abs): 0 10*3/uL (ref 0.0–0.1)
Lymphocytes Absolute: 2.2 10*3/uL (ref 0.7–3.1)
Lymphs: 28 %
MCH: 29.5 pg (ref 26.6–33.0)
MCHC: 34.1 g/dL (ref 31.5–35.7)
MCV: 87 fL (ref 79–97)
Monocytes Absolute: 0.6 10*3/uL (ref 0.1–0.9)
Monocytes: 8 %
NEUTROS PCT: 62 %
Neutrophils Absolute: 5 10*3/uL (ref 1.4–7.0)
PLATELETS: 256 10*3/uL (ref 150–379)
RBC: 4.78 x10E6/uL (ref 4.14–5.80)
RDW: 14.2 % (ref 12.3–15.4)
WBC: 8 10*3/uL (ref 3.4–10.8)

## 2017-02-14 LAB — COMPREHENSIVE METABOLIC PANEL
ALBUMIN: 4.4 g/dL (ref 3.6–4.8)
ALT: 21 IU/L (ref 0–44)
AST: 19 IU/L (ref 0–40)
Albumin/Globulin Ratio: 1.9 (ref 1.2–2.2)
Alkaline Phosphatase: 57 IU/L (ref 39–117)
BUN / CREAT RATIO: 18 (ref 10–24)
BUN: 16 mg/dL (ref 8–27)
Bilirubin Total: 0.5 mg/dL (ref 0.0–1.2)
CALCIUM: 9.1 mg/dL (ref 8.6–10.2)
CO2: 24 mmol/L (ref 20–29)
CREATININE: 0.9 mg/dL (ref 0.76–1.27)
Chloride: 101 mmol/L (ref 96–106)
GFR calc Af Amer: 102 mL/min/{1.73_m2} (ref >59)
GFR, EST NON AFRICAN AMERICAN: 88 mL/min/{1.73_m2} (ref >59)
GLOBULIN, TOTAL: 2.3 g/dL (ref 1.5–4.5)
GLUCOSE: 200 mg/dL — AB (ref 65–99)
Potassium: 4.6 mmol/L (ref 3.5–5.2)
SODIUM: 140 mmol/L (ref 134–144)
Total Protein: 6.7 g/dL (ref 6.0–8.5)

## 2017-02-14 LAB — LIPID PANEL
Chol/HDL Ratio: 3.6 ratio (ref 0.0–5.0)
Cholesterol, Total: 154 mg/dL (ref 100–199)
HDL: 43 mg/dL (ref >39)
LDL Calculated: 74 mg/dL (ref 0–99)
TRIGLYCERIDES: 186 mg/dL — AB (ref 0–149)
VLDL Cholesterol Cal: 37 mg/dL (ref 5–40)

## 2017-02-14 LAB — HEMOGLOBIN A1C
ESTIMATED AVERAGE GLUCOSE: 189 mg/dL
Hgb A1c MFr Bld: 8.2 % — ABNORMAL HIGH (ref 4.8–5.6)

## 2017-02-14 LAB — URIC ACID: URIC ACID: 3.7 mg/dL (ref 3.7–8.6)

## 2017-02-14 LAB — TSH: TSH: 1.43 u[IU]/mL (ref 0.450–4.500)

## 2017-02-19 ENCOUNTER — Telehealth: Payer: Self-pay

## 2017-02-19 ENCOUNTER — Other Ambulatory Visit: Payer: Self-pay | Admitting: Internal Medicine

## 2017-02-19 NOTE — Telephone Encounter (Signed)
Pt last saw Dr Rayann Heman 02/21/16, has 1 year follow-up scheduled for 02/26/17 with Dr Rayann Heman.  Last lab work 02/13/17 Creat 0.90, age 67, weight 78.7kg, CrCl 88.66, based on CrCl pt is on appropriate dosage of Xarelto 20mg  QD.  Will refill rx.

## 2017-02-19 NOTE — Telephone Encounter (Signed)
-----   Message from Jerrol Banana., MD sent at 02/19/2017 12:43 PM EST ----- DM/BS a little higher.Uric acid ok. Lipids ok.

## 2017-02-19 NOTE — Telephone Encounter (Signed)
Patient advised.

## 2017-02-19 NOTE — Telephone Encounter (Signed)
LMTCB

## 2017-02-24 ENCOUNTER — Other Ambulatory Visit: Payer: Self-pay | Admitting: Internal Medicine

## 2017-02-24 DIAGNOSIS — I10 Essential (primary) hypertension: Secondary | ICD-10-CM

## 2017-02-26 ENCOUNTER — Encounter: Payer: Self-pay | Admitting: Internal Medicine

## 2017-02-26 ENCOUNTER — Ambulatory Visit: Payer: PPO | Admitting: Internal Medicine

## 2017-02-26 VITALS — BP 132/70 | HR 68 | Ht 69.0 in | Wt 173.0 lb

## 2017-02-26 DIAGNOSIS — I480 Paroxysmal atrial fibrillation: Secondary | ICD-10-CM | POA: Diagnosis not present

## 2017-02-26 DIAGNOSIS — I10 Essential (primary) hypertension: Secondary | ICD-10-CM

## 2017-02-26 DIAGNOSIS — E785 Hyperlipidemia, unspecified: Secondary | ICD-10-CM | POA: Diagnosis not present

## 2017-02-26 DIAGNOSIS — I482 Chronic atrial fibrillation, unspecified: Secondary | ICD-10-CM

## 2017-02-26 MED ORDER — METOPROLOL TARTRATE 50 MG PO TABS: ORAL_TABLET | ORAL | 3 refills | Status: DC

## 2017-02-26 NOTE — Progress Notes (Signed)
   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 male who presents today for routine electrophysiology followup.  Since last being seen in our clinic, the patient reports doing very well.  Today, he denies symptoms of palpitations, chest pain, shortness of breath,  lower extremity edema, dizziness, presyncope, or syncope.  The patient is otherwise without complaint today.   Past Medical History:  Diagnosis Date  . Atrial fibrillation (HCC)    paroxysmal  . Cancer ([place])    SCC removed from neck  . Cough syncope   . Diabetes mellitus   . Dyslipidemia   . Gout   . Hyperlipidemia   . Hypertension   . Hypopotassemia    Past Surgical History:  Procedure Laterality Date  . COLONOSCOPY WITH PROPOFOL N/A 11/07/2016   Procedure: COLONOSCOPY WITH PROPOFOL;  Surgeon: Lollie Sails, MD;  [facility]: [facility] ENDOSCOPY;  Service: Endoscopy;  Laterality: N/A;  . TONSILLECTOMY AND ADENOIDECTOMY      ROS- all systems are reviewed and negatives except as per HPI above  Current Outpatient Medications  Medication Sig Dispense Refill  . allopurinol (ZYLOPRIM) 300 MG tablet TAKE 2 TABLETS BY MOUTH DAILY 180 tablet 3  . Cholecalciferol (VITAMIN D3) 2000 UNITS TABS Take 1 tablet by mouth daily.    Marland Kitchen CINNAMON PO Take 2,000 mg by mouth daily.    Marland Kitchen glucose blood (ONETOUCH VERIO) test strip Check sugar once daily DX E11.9 30 each 12  . HYDROcodone-acetaminophen (NORCO/VICODIN) 5-325 MG tablet Take 0.5-1 tablets by mouth 2 (two) times daily as needed (pain). 60 tablet 0  . losartan (COZAAR) 25 MG tablet TAKE 1 TABLET BY MOUTH DAILY 30 tablet 11  . metFORMIN (GLUCOPHAGE-XR) 500 MG 24 hr tablet TAKE 2 TABLETS BY [facility] TWICE DAILY 120 tablet 11  . metoprolol (LOPRESSOR) 50 MG tablet Take one-half tablet (25 mg) by mouth twice daily as needed for palpitations and/or fast heart rates    . Omega-3 Fatty Acids (FISH OIL) 1000 MG CAPS Take 1 capsule by mouth daily.    .  rosuvastatin (CRESTOR) 10 MG tablet TAKE 1 TABLET BY MOUTH DAILY 90 tablet 0  . TURMERIC PO Take by mouth.    . vitamin B-12 (CYANOCOBALAMIN) 1000 MCG tablet Take 1,000 mcg by mouth daily.    Alveda Reasons 20 MG TABS tablet TAKE 1 TABLET BY MOUTH DAILY WITH SUPPER 90 tablet 3   No current facility-administered medications for this visit.     Physical Exam: Vitals:   02/26/17 0753  BP: 132/70  Pulse: 68  Weight: 173 lb (78.5 kg)  Height: 5\' 9"  (1.753 m)    GEN- The patient is well appearing, alert and oriented x 3 today.   Head- normocephalic, atraumatic Eyes-  Sclera clear, conjunctiva pink Ears- hearing intact Oropharynx- clear Lungs- Clear to ausculation bilaterally, normal work of breathing Heart- Regular rate and rhythm, no murmurs, rubs or gallops, PMI not laterally displaced GI- soft, NT, ND, + BS Extremities- no clubbing, cyanosis, or edema  EKG tracing ordered today is personally reviewed and shows sinus rhythm, normal ekg  Assessment and Plan:  1. afib Maintaining sinus rhythm On xarelto (chads2vasc score is 3) Echo 03/06/16 reviewed with patient again today  2. HTN Stable No change required today  3. HL Followed by PCP  Return to see me in a year  Thompson Grayer MD, [facility] 02/26/2017 8:13 AM

## 2017-02-26 NOTE — Progress Notes (Signed)
[id number]

## 2017-02-26 NOTE — Patient Instructions (Signed)

## 2017-03-13 DIAGNOSIS — M5431 Sciatica, right side: Secondary | ICD-10-CM | POA: Diagnosis not present

## 2017-03-13 DIAGNOSIS — M9905 Segmental and somatic dysfunction of pelvic region: Secondary | ICD-10-CM | POA: Diagnosis not present

## 2017-03-13 DIAGNOSIS — M5416 Radiculopathy, lumbar region: Secondary | ICD-10-CM | POA: Diagnosis not present

## 2017-03-13 DIAGNOSIS — M9903 Segmental and somatic dysfunction of lumbar region: Secondary | ICD-10-CM | POA: Diagnosis not present

## 2017-03-25 ENCOUNTER — Other Ambulatory Visit: Payer: Self-pay | Admitting: Family Medicine

## 2017-03-25 DIAGNOSIS — E118 Type 2 diabetes mellitus with unspecified complications: Secondary | ICD-10-CM

## 2017-04-11 DIAGNOSIS — M5431 Sciatica, right side: Secondary | ICD-10-CM | POA: Diagnosis not present

## 2017-04-11 DIAGNOSIS — M9903 Segmental and somatic dysfunction of lumbar region: Secondary | ICD-10-CM | POA: Diagnosis not present

## 2017-04-11 DIAGNOSIS — M5416 Radiculopathy, lumbar region: Secondary | ICD-10-CM | POA: Diagnosis not present

## 2017-04-11 DIAGNOSIS — M9905 Segmental and somatic dysfunction of pelvic region: Secondary | ICD-10-CM | POA: Diagnosis not present

## 2017-04-30 DIAGNOSIS — L570 Actinic keratosis: Secondary | ICD-10-CM | POA: Diagnosis not present

## 2017-04-30 DIAGNOSIS — L578 Other skin changes due to chronic exposure to nonionizing radiation: Secondary | ICD-10-CM | POA: Diagnosis not present

## 2017-04-30 DIAGNOSIS — Z85828 Personal history of other malignant neoplasm of skin: Secondary | ICD-10-CM | POA: Diagnosis not present

## 2017-04-30 DIAGNOSIS — L821 Other seborrheic keratosis: Secondary | ICD-10-CM | POA: Diagnosis not present

## 2017-04-30 DIAGNOSIS — L659 Nonscarring hair loss, unspecified: Secondary | ICD-10-CM | POA: Diagnosis not present

## 2017-05-07 DIAGNOSIS — M25511 Pain in right shoulder: Secondary | ICD-10-CM | POA: Diagnosis not present

## 2017-05-07 DIAGNOSIS — M1A09X0 Idiopathic chronic gout, multiple sites, without tophus (tophi): Secondary | ICD-10-CM | POA: Diagnosis not present

## 2017-05-07 DIAGNOSIS — E663 Overweight: Secondary | ICD-10-CM | POA: Diagnosis not present

## 2017-05-07 DIAGNOSIS — M150 Primary generalized (osteo)arthritis: Secondary | ICD-10-CM | POA: Diagnosis not present

## 2017-05-07 DIAGNOSIS — M79645 Pain in left finger(s): Secondary | ICD-10-CM | POA: Diagnosis not present

## 2017-05-07 DIAGNOSIS — Z6825 Body mass index (BMI) 25.0-25.9, adult: Secondary | ICD-10-CM | POA: Diagnosis not present

## 2017-05-21 ENCOUNTER — Other Ambulatory Visit: Payer: Self-pay | Admitting: Family Medicine

## 2017-05-21 DIAGNOSIS — I10 Essential (primary) hypertension: Secondary | ICD-10-CM

## 2017-05-21 MED ORDER — ROSUVASTATIN CALCIUM 10 MG PO TABS: 10.0000 mg | ORAL_TABLET | Freq: Every day | ORAL | 3 refills | Status: DC

## 2017-05-21 NOTE — Telephone Encounter (Signed)
[facility] faxed a refill request for the following medication. Thanks CC  rosuvastatin (CRESTOR) 10 MG tablet

## 2017-05-29 DIAGNOSIS — M9905 Segmental and somatic dysfunction of pelvic region: Secondary | ICD-10-CM | POA: Diagnosis not present

## 2017-05-29 DIAGNOSIS — M9903 Segmental and somatic dysfunction of lumbar region: Secondary | ICD-10-CM | POA: Diagnosis not present

## 2017-05-29 DIAGNOSIS — M5431 Sciatica, right side: Secondary | ICD-10-CM | POA: Diagnosis not present

## 2017-05-29 DIAGNOSIS — M5416 Radiculopathy, lumbar region: Secondary | ICD-10-CM | POA: Diagnosis not present

## 2017-06-12 ENCOUNTER — Other Ambulatory Visit: Payer: Self-pay | Admitting: Family Medicine

## 2017-06-12 MED ORDER — GLUCOSE BLOOD VI STRP: ORAL_STRIP | 12 refills | Status: DC

## 2017-06-12 NOTE — Telephone Encounter (Signed)
[facility] faxed a refill request for the following prescription. Thanks CC  glucose blood (ONETOUCH VERIO) test strip

## 2017-06-16 ENCOUNTER — Ambulatory Visit (INDEPENDENT_AMBULATORY_CARE_PROVIDER_SITE_OTHER): Payer: PPO | Admitting: Family Medicine

## 2017-06-16 VITALS — BP 136/72 | HR 76 | Temp 97.8°F | Resp 16 | Wt 170.0 lb

## 2017-06-16 DIAGNOSIS — M109 Gout, unspecified: Secondary | ICD-10-CM

## 2017-06-16 DIAGNOSIS — E118 Type 2 diabetes mellitus with unspecified complications: Secondary | ICD-10-CM

## 2017-06-16 DIAGNOSIS — I481 Persistent atrial fibrillation: Secondary | ICD-10-CM

## 2017-06-16 DIAGNOSIS — N529 Male erectile dysfunction, unspecified: Secondary | ICD-10-CM | POA: Diagnosis not present

## 2017-06-16 DIAGNOSIS — I4819 Other persistent atrial fibrillation: Secondary | ICD-10-CM

## 2017-06-16 DIAGNOSIS — I10 Essential (primary) hypertension: Secondary | ICD-10-CM

## 2017-06-16 MED ORDER — TADALAFIL 5 MG PO TABS: 5.0000 mg | ORAL_TABLET | ORAL | 11 refills | Status: DC

## 2017-06-16 MED ORDER — GLUCOSE BLOOD VI STRP: ORAL_STRIP | 12 refills | Status: DC

## 2017-06-16 NOTE — Progress Notes (Signed)
Benjamin [name]  MRN: [id number] DOB: 08-15-1950  Subjective:  HPI   The patient is a 67 year old male who presents for follow up of chronic health.  He was last seen on 02/13/17.  Diabetes-A1C from January was 8.2.  Patient has been checking his glucose at home and gets readings that range 116-180.  Hypertension-Blood pressure readings have been stable.   AFib controlled.  Lipids controlled. Gout OK. BP Readings from Last 3 Encounters:  06/16/17 136/72  02/26/17 132/70  02/13/17 132/74    Patient Active Problem List   Diagnosis Date Noted  . Hypertension   . Hyperlipidemia   . Dyslipidemia   . Cough syncope   . Allergic rhinitis 07/04/2014  . Arthritis 07/04/2014  . Back pain, chronic 07/04/2014  . Abnormal glucose tolerance test 07/04/2014  . HLD (hyperlipidemia) 07/04/2014  . Raynaud's syndrome 07/04/2014  . Diabetes mellitus, type 2 ([place]) 07/04/2014  . HYPERTENSION, BENIGN 02/20/2009  . UNSPECIFIED ACUTE PERICARDITIS 01/23/2009  . ATRIAL FIBRILLATION 01/23/2009  . COUGH SYNCOPE 01/23/2009  . DYSLIPIDEMIA 01/20/2009  . Gout 01/20/2009    Past Medical History:  Diagnosis Date  . Atrial fibrillation (HCC)    paroxysmal  . Cancer ([place])    SCC removed from neck  . Cough syncope   . Diabetes mellitus   . Dyslipidemia   . Gout   . Hyperlipidemia   . Hypertension   . Hypopotassemia     Social History   Socioeconomic History  . Marital status: Married    Spouse name: Baldo Ash  . Number of children: 2  . Years of education: 16  . Highest education level: Bachelor's degree (e.g., BA, AB, BS)  Occupational History  . 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 Financial resource strain: Not hard at all  . Food insecurity:    Worry: Never true    Inability: Never true  . Transportation needs:    Medical: No    Non-medical: No  Tobacco Use  . Smoking status: Former Smoker    Types: Cigarettes    Last attempt to quit:  01/23/1971    Years since quitting: 46.4  . Smokeless tobacco: Never Used  Substance and Sexual Activity  . Alcohol use: Yes    Alcohol/week: 2.4 oz    Types: 4 Glasses of wine per week    Comment: only on weekends  . Drug use: No  . Sexual activity: Yes  Lifestyle  . Physical activity:    Days per week: Not on file    Minutes per session: Not on file  . Stress: Only a little  Relationships  . Social connections:    Talks on phone: Not on file    Gets together: Not on file    Attends religious service: Not on file    Active member of club or organization: Not on file    Attends meetings of clubs or organizations: Not on file    Relationship status: Not on file  . Intimate partner violence:    Fear of current or ex partner: No    Emotionally abused: No    Physically abused: No    Forced sexual activity: No  Other Topics Concern  . Not on file  Social History Narrative    SOCIAL HISTORY:  He lives in [place] with his wife.  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  He exercises as stated above.  No tobacco or illicit substance use.  He does enjoy occasional beer and vodka, mixed drink on the weekends.  He tries to follow a low-cholesterol diet.          FAMILY HISTORY:  Mother is alive, she has a pacemaker secondary to heart block, she also has a stable abdominal aneurysm.  Father's health, interesting that he had an MI at age 63 and then apparently died in his sleep at age 39 that was felt to be secondary to MI.  He has 3 siblings who are alive and well.     Outpatient Encounter Medications as of 06/16/2017  Medication Sig  . allopurinol (ZYLOPRIM) 300 MG tablet TAKE 2 TABLETS BY MOUTH DAILY  . Cholecalciferol (VITAMIN D3) 2000 UNITS TABS Take 1 tablet by mouth daily.  Marland Kitchen CINNAMON PO Take 2,000 mg by mouth daily.  Marland Kitchen glucose blood (ONETOUCH VERIO) test strip Check sugar once daily DX E11.9  . HYDROcodone-acetaminophen (NORCO/VICODIN)  5-325 MG tablet Take 0.5-1 tablets by mouth 2 (two) times daily as needed (pain).  Marland Kitchen losartan (COZAAR) 25 MG tablet TAKE 1 TABLET BY MOUTH DAILY  . metFORMIN (GLUCOPHAGE-XR) 500 MG 24 hr tablet TAKE TWO TABLETS TWICE A DA  . metoprolol tartrate (LOPRESSOR) 50 MG tablet Take one-half tablet (25 mg) by mouth twice daily as needed for palpitations and/or fast heart rates  . Omega-3 Fatty Acids (FISH OIL) 1000 MG CAPS Take 1 capsule by mouth daily.  . rosuvastatin (CRESTOR) 10 MG tablet Take 1 tablet (10 mg total) by mouth daily.  . TURMERIC PO Take by mouth.  . vitamin B-12 (CYANOCOBALAMIN) 1000 MCG tablet Take 1,000 mcg by mouth daily.  Alveda Reasons 20 MG TABS tablet TAKE 1 TABLET BY MOUTH DAILY WITH SUPPER   No facility-administered encounter medications on file as of 06/16/2017.     No Known Allergies  Review of Systems  Constitutional: Negative for fever and malaise/fatigue.  HENT: Negative.   Eyes: Negative.   Respiratory: Negative for cough, shortness of breath and wheezing.   Cardiovascular: Negative for chest pain, palpitations, orthopnea and leg swelling.  Gastrointestinal: Negative.   Genitourinary:       ED.  Musculoskeletal:       No gout flares.  Skin: Negative.   Neurological: Negative for dizziness and headaches.  Endo/Heme/Allergies: Negative.   Psychiatric/Behavioral: Negative.     Objective:  BP 136/72 (BP Location: Right Arm, Patient Position: Sitting, Cuff Size: Normal)   Pulse 76   Temp 97.8 F (36.6 C) (Oral)   Resp 16   Wt 170 lb (77.1 kg)   BMI 25.10 kg/m   Physical Exam  Constitutional: He is oriented to person, place, and time and well-developed, well-nourished, and in no distress.  HENT:  Head: Normocephalic and atraumatic.  Right Ear: External ear normal.  Left Ear: External ear normal.  Nose: Nose normal.  Eyes: Conjunctivae are normal. No scleral icterus.  Neck: No thyromegaly present.  Cardiovascular: Normal rate, regular rhythm and normal  heart sounds.  Pulmonary/Chest: Effort normal and breath sounds normal.  Abdominal: Soft. Bowel sounds are normal.  Musculoskeletal: He exhibits no edema.  Neurological: He is alert and oriented to person, place, and time. Gait normal. GCS score is 15.  Skin: Skin is warm and dry.  Psychiatric: Mood, memory, affect and judgment normal.    Assessment and Plan :  1. Type 2 diabetes mellitus with complication, without long-term current use of insulin (HCC) RTC 4 months. - POCT glycosylated hemoglobin (  Hb A1C)--7.0 today - glucose blood (ONETOUCH VERIO) test strip; Check sugar once daily DX E11.9  Dispense: 30 each; Refill: 12  2. Erectile dysfunction, unspecified erectile dysfunction type May need urology referral. - tadalafil (CIALIS) 5 MG tablet; Take 1 tablet (5 mg total) by mouth See admin instructions. 1-3 every other day prn  Dispense: 50 tablet; Refill: 11  3.Afib 4.HLD 5.HTN 6.Gout    I have done the exam and reviewed the chart and it is accurate to the best of my knowledge. Development worker, community has been used and  any errors in dictation or transcription are unintentional. Miguel Aschoff M.D. [facility]

## 2017-06-19 LAB — POCT GLYCOSYLATED HEMOGLOBIN (HGB A1C): HEMOGLOBIN A1C: 7

## 2017-06-26 DIAGNOSIS — M9903 Segmental and somatic dysfunction of lumbar region: Secondary | ICD-10-CM | POA: Diagnosis not present

## 2017-06-26 DIAGNOSIS — M5431 Sciatica, right side: Secondary | ICD-10-CM | POA: Diagnosis not present

## 2017-06-26 DIAGNOSIS — M5416 Radiculopathy, lumbar region: Secondary | ICD-10-CM | POA: Diagnosis not present

## 2017-06-26 DIAGNOSIS — M9905 Segmental and somatic dysfunction of pelvic region: Secondary | ICD-10-CM | POA: Diagnosis not present

## 2017-07-24 DIAGNOSIS — M9905 Segmental and somatic dysfunction of pelvic region: Secondary | ICD-10-CM | POA: Diagnosis not present

## 2017-07-24 DIAGNOSIS — M5416 Radiculopathy, lumbar region: Secondary | ICD-10-CM | POA: Diagnosis not present

## 2017-07-24 DIAGNOSIS — M5431 Sciatica, right side: Secondary | ICD-10-CM | POA: Diagnosis not present

## 2017-07-24 DIAGNOSIS — M9903 Segmental and somatic dysfunction of lumbar region: Secondary | ICD-10-CM | POA: Diagnosis not present

## 2017-08-28 DIAGNOSIS — M9903 Segmental and somatic dysfunction of lumbar region: Secondary | ICD-10-CM | POA: Diagnosis not present

## 2017-08-28 DIAGNOSIS — M5416 Radiculopathy, lumbar region: Secondary | ICD-10-CM | POA: Diagnosis not present

## 2017-08-28 DIAGNOSIS — M5431 Sciatica, right side: Secondary | ICD-10-CM | POA: Diagnosis not present

## 2017-08-28 DIAGNOSIS — M9905 Segmental and somatic dysfunction of pelvic region: Secondary | ICD-10-CM | POA: Diagnosis not present

## 2017-09-17 DIAGNOSIS — E119 Type 2 diabetes mellitus without complications: Secondary | ICD-10-CM | POA: Diagnosis not present

## 2017-09-17 LAB — HM DIABETES EYE EXAM

## 2017-09-22 ENCOUNTER — Other Ambulatory Visit: Payer: Self-pay | Admitting: Family Medicine

## 2017-09-25 DIAGNOSIS — M5416 Radiculopathy, lumbar region: Secondary | ICD-10-CM | POA: Diagnosis not present

## 2017-09-25 DIAGNOSIS — M9903 Segmental and somatic dysfunction of lumbar region: Secondary | ICD-10-CM | POA: Diagnosis not present

## 2017-09-25 DIAGNOSIS — M9905 Segmental and somatic dysfunction of pelvic region: Secondary | ICD-10-CM | POA: Diagnosis not present

## 2017-09-25 DIAGNOSIS — M5431 Sciatica, right side: Secondary | ICD-10-CM | POA: Diagnosis not present

## 2017-09-29 DIAGNOSIS — M79645 Pain in left finger(s): Secondary | ICD-10-CM | POA: Diagnosis not present

## 2017-09-29 DIAGNOSIS — M25511 Pain in right shoulder: Secondary | ICD-10-CM | POA: Diagnosis not present

## 2017-09-29 DIAGNOSIS — M79644 Pain in right finger(s): Secondary | ICD-10-CM | POA: Diagnosis not present

## 2017-09-29 DIAGNOSIS — M150 Primary generalized (osteo)arthritis: Secondary | ICD-10-CM | POA: Diagnosis not present

## 2017-09-29 DIAGNOSIS — E663 Overweight: Secondary | ICD-10-CM | POA: Diagnosis not present

## 2017-09-29 DIAGNOSIS — M1A09X0 Idiopathic chronic gout, multiple sites, without tophus (tophi): Secondary | ICD-10-CM | POA: Diagnosis not present

## 2017-09-29 DIAGNOSIS — Z6825 Body mass index (BMI) 25.0-25.9, adult: Secondary | ICD-10-CM | POA: Diagnosis not present

## 2017-10-07 ENCOUNTER — Other Ambulatory Visit: Payer: Self-pay | Admitting: Family Medicine

## 2017-10-07 DIAGNOSIS — E1121 Type 2 diabetes mellitus with diabetic nephropathy: Secondary | ICD-10-CM

## 2017-10-16 ENCOUNTER — Encounter: Payer: Self-pay | Admitting: Family Medicine

## 2017-10-16 ENCOUNTER — Ambulatory Visit (INDEPENDENT_AMBULATORY_CARE_PROVIDER_SITE_OTHER): Payer: PPO | Admitting: Family Medicine

## 2017-10-16 VITALS — BP 118/72 | HR 62 | Temp 97.9°F | Resp 16 | Ht 69.0 in | Wt 165.0 lb

## 2017-10-16 DIAGNOSIS — E118 Type 2 diabetes mellitus with unspecified complications: Secondary | ICD-10-CM | POA: Diagnosis not present

## 2017-10-16 DIAGNOSIS — N529 Male erectile dysfunction, unspecified: Secondary | ICD-10-CM

## 2017-10-16 DIAGNOSIS — I10 Essential (primary) hypertension: Secondary | ICD-10-CM

## 2017-10-16 DIAGNOSIS — T753XXA Motion sickness, initial encounter: Secondary | ICD-10-CM

## 2017-10-16 DIAGNOSIS — E785 Hyperlipidemia, unspecified: Secondary | ICD-10-CM

## 2017-10-16 DIAGNOSIS — Z23 Encounter for immunization: Secondary | ICD-10-CM

## 2017-10-16 LAB — POCT GLYCOSYLATED HEMOGLOBIN (HGB A1C): Hemoglobin A1C: 6.4 % — AB (ref 4.0–5.6)

## 2017-10-16 LAB — POCT UA - MICROALBUMIN: Microalbumin Ur, POC: 20 mg/L

## 2017-10-16 MED ORDER — SCOPOLAMINE 1 MG/3DAYS TD PT72: 1.0000 | MEDICATED_PATCH | TRANSDERMAL | 12 refills | Status: DC

## 2017-10-16 MED ORDER — SILDENAFIL CITRATE 20 MG PO TABS: 20.0000 mg | ORAL_TABLET | Freq: Three times a day (TID) | ORAL | 3 refills | Status: DC | PRN

## 2017-10-16 NOTE — Progress Notes (Signed)
Patient: Benjamin [name] Male    DOB: Aug 27, 1950   67 y.o.   MRN: [id number] Visit Date: 10/16/2017  Today's Provider: Wilhemena Durie, MD   Chief Complaint  Patient presents with  . Diabetes  . Hypertension  . Hyperlipidemia   Subjective:    HPI  Diabetes Mellitus Type II, Follow-up:   Lab Results  Component Value Date   HGBA1C 6.4 (A) 10/16/2017   HGBA1C 7.0 06/19/2017   HGBA1C 8.2 (H) 02/13/2017    Last seen for diabetes 4 months ago.  Management since then includes no changes. He reports good compliance with treatment. He is not having side effects.  Current symptoms include none and have been stable. Home blood sugar records: fasting range: 120s  Episodes of hypoglycemia? no   Current Insulin Regimen: none Most Recent Eye Exam: up to date  Weight trend: stable Prior visit with dietician: no Current diet: well balanced Current exercise: walking  Pertinent Labs:    Component Value Date/Time   CHOL 154 02/13/2017 1031   TRIG 186 (H) 02/13/2017 1031   HDL 43 02/13/2017 1031   LDLCALC 74 02/13/2017 1031   CREATININE 0.90 02/13/2017 1031    Wt Readings from Last 3 Encounters:  10/16/17 165 lb (74.8 kg)  06/16/17 170 lb (77.1 kg)  02/26/17 173 lb (78.5 kg)     Hypertension, follow-up:  BP Readings from Last 3 Encounters:  10/16/17 118/72  06/16/17 136/72  02/26/17 132/70    He was last seen for hypertension 4 months ago.  BP at that visit was 136/72. Management since that visit includes no changes. He reports good compliance with treatment. He is not having side effects.  He is exercising. He is adherent to low salt diet.   Outside blood pressures are checked occasionally. He is experiencing none.  Patient denies exertional chest pressure/discomfort, lower extremity edema and palpitations.      Lipid/Cholesterol, Follow-up:   Last seen for this4 months ago.  Management changes since that visit include no changes. . Last Lipid  Panel:    Component Value Date/Time   CHOL 154 02/13/2017 1031   TRIG 186 (H) 02/13/2017 1031   HDL 43 02/13/2017 1031   CHOLHDL 3.6 02/13/2017 1031   CHOLHDL 6.0 01/06/2009 0630   VLDL 56 (H) 01/06/2009 0630   LDLCALC 74 02/13/2017 1031    Risk factors for vascular disease include diabetes mellitus and hypertension  He reports good compliance with treatment. He is not having side effects.  Current symptoms include none and have been stable.  No Known Allergies   Current Outpatient Medications:  .  allopurinol (ZYLOPRIM) 300 MG tablet, TAKE 2 TABLETS BY MOUTH DAILY, Disp: 180 tablet, Rfl: 3 .  Cholecalciferol (VITAMIN D3) 2000 UNITS TABS, Take 1 tablet by mouth daily., Disp: , Rfl:  .  CINNAMON PO, Take 2,000 mg by mouth daily., Disp: , Rfl:  .  glucose blood (ONETOUCH VERIO) test strip, Check sugar once daily DX E11.9, Disp: 30 each, Rfl: 12 .  HYDROcodone-acetaminophen (NORCO/VICODIN) 5-325 MG tablet, Take 0.5-1 tablets by mouth 2 (two) times daily as needed (pain)., Disp: 60 tablet, Rfl: 0 .  losartan (COZAAR) 25 MG tablet, TAKE 1 TABLET DAILY, Disp: 30 tablet, Rfl: 11 .  metFORMIN (GLUCOPHAGE-XR) 500 MG 24 hr tablet, TAKE TWO TABLETS TWICE A DA, Disp: 120 tablet, Rfl: 11 .  metoprolol tartrate (LOPRESSOR) 50 MG tablet, Take one-half tablet (25 mg) by mouth twice daily as needed  for palpitations and/or fast heart rates, Disp: 60 tablet, Rfl: 3 .  Omega-3 Fatty Acids (FISH OIL) 1000 MG CAPS, Take 1 capsule by mouth daily., Disp: , Rfl:  .  rosuvastatin (CRESTOR) 10 MG tablet, Take 1 tablet (10 mg total) by mouth daily., Disp: 90 tablet, Rfl: 3 .  tadalafil (CIALIS) 5 MG tablet, Take 1 tablet (5 mg total) by mouth See admin instructions. 1-3 every other day prn, Disp: 50 tablet, Rfl: 11 .  TURMERIC PO, Take by mouth., Disp: , Rfl:  .  vitamin B-12 (CYANOCOBALAMIN) 1000 MCG tablet, Take 1,000 mcg by mouth daily., Disp: , Rfl:  .  XARELTO 20 MG TABS tablet, TAKE 1 TABLET BY MOUTH  DAILY WITH SUPPER, Disp: 90 tablet, Rfl: 3  Review of Systems  Constitutional: Negative for activity change, appetite change, chills, diaphoresis, fatigue, fever and unexpected weight change.  Eyes: Negative.   Respiratory: Negative for cough and shortness of breath.   Cardiovascular: Negative for chest pain, palpitations and leg swelling.  Endocrine: Negative for cold intolerance, heat intolerance, polydipsia, polyphagia and polyuria.  Musculoskeletal: Positive for arthralgias.  Allergic/Immunologic: Negative.   Neurological: Negative for dizziness, light-headedness and headaches.  Psychiatric/Behavioral: Negative.     Social History   Tobacco Use  . Smoking status: Former Smoker    Types: Cigarettes    Last attempt to quit: 01/23/1971    Years since quitting: 46.7  . Smokeless tobacco: Never Used  Substance Use Topics  . Alcohol use: Yes    Alcohol/week: 4.0 standard drinks    Types: 4 Glasses of wine per week    Comment: only on weekends   Objective:   BP 118/72 (BP Location: Right Leg, Patient Position: Sitting, Cuff Size: Normal)   Pulse 62   Temp 97.9 F (36.6 C)   Resp 16   Ht 5\' 9"  (1.753 m)   Wt 165 lb (74.8 kg)   SpO2 96%   BMI 24.37 kg/m  Vitals:   10/16/17 0819  BP: 118/72  Pulse: 62  Resp: 16  Temp: 97.9 F (36.6 C)  SpO2: 96%  Weight: 165 lb (74.8 kg)  Height: 5\' 9"  (1.753 m)     Physical Exam  Constitutional: He is oriented to person, place, and time. He appears well-developed and well-nourished.  HENT:  Head: Normocephalic and atraumatic.  Right Ear: External ear normal.  Left Ear: External ear normal.  Nose: Nose normal.  Mouth/Throat: Oropharynx is clear and moist.  Eyes: Conjunctivae are normal.  Neck: No thyromegaly present.  Cardiovascular: Normal rate, regular rhythm and normal heart sounds.  Pulmonary/Chest: Effort normal and breath sounds normal.  Abdominal: Soft.  Musculoskeletal: He exhibits no edema.  Neurological: He is  alert and oriented to person, place, and time.  Skin: Skin is warm and dry.  Psychiatric: He has a normal mood and affect. His behavior is normal. Judgment and thought content normal.        Assessment & Plan:     1. Type 2 diabetes mellitus with complication, without long-term current use of insulin (HCC)  - POCT glycosylated hemoglobin (Hb A1C) - POCT UA - Microalbumin  2. Essential hypertension   3. Hyperlipidemia, unspecified hyperlipidemia type   4. Erectile dysfunction, unspecified erectile dysfunction type  - sildenafil (REVATIO) 20 MG tablet; Take 1 tablet (20 mg total) by mouth 3 (three) times daily as needed.  Dispense: 50 tablet; Refill: 3  5. Flu vaccine need  - Flu vaccine HIGH DOSE PF (Fluzone High dose)  6. Motion sickness, initial encounter  - scopolamine (TRANSDERM SCOP, 1.5 MG,) 1 MG/3DAYS; Place 1 patch (1.5 mg total) onto the skin every 3 (three) days.  Dispense: 10 patch; Refill: 12       I have done the exam and reviewed the above chart and it is accurate to the best of my knowledge. Development worker, community has been used in this note in any air is in the dictation or transcription are unintentional.  Wilhemena Durie, MD  [place]

## 2017-10-23 DIAGNOSIS — M5431 Sciatica, right side: Secondary | ICD-10-CM | POA: Diagnosis not present

## 2017-10-23 DIAGNOSIS — M5416 Radiculopathy, lumbar region: Secondary | ICD-10-CM | POA: Diagnosis not present

## 2017-10-23 DIAGNOSIS — M9903 Segmental and somatic dysfunction of lumbar region: Secondary | ICD-10-CM | POA: Diagnosis not present

## 2017-10-23 DIAGNOSIS — M9905 Segmental and somatic dysfunction of pelvic region: Secondary | ICD-10-CM | POA: Diagnosis not present

## 2017-10-29 ENCOUNTER — Other Ambulatory Visit: Payer: Self-pay | Admitting: *Deleted

## 2017-10-29 MED ORDER — RIVAROXABAN 20 MG PO TABS: ORAL_TABLET | ORAL | 1 refills | Status: DC

## 2017-10-29 NOTE — Telephone Encounter (Signed)
Xarelto 20mg  refill request received from [facility].  Pt is 67 yrs old, wt-74.8kg, Crea-0.90 on 02/13/17, last seen by [name] on 02/26/17, CrCl-83.67ml/min; will send in refill to requested pharmacy.

## 2017-11-20 DIAGNOSIS — M5416 Radiculopathy, lumbar region: Secondary | ICD-10-CM | POA: Diagnosis not present

## 2017-11-20 DIAGNOSIS — M9905 Segmental and somatic dysfunction of pelvic region: Secondary | ICD-10-CM | POA: Diagnosis not present

## 2017-11-20 DIAGNOSIS — M5431 Sciatica, right side: Secondary | ICD-10-CM | POA: Diagnosis not present

## 2017-11-20 DIAGNOSIS — M9903 Segmental and somatic dysfunction of lumbar region: Secondary | ICD-10-CM | POA: Diagnosis not present

## 2017-12-25 DIAGNOSIS — M9903 Segmental and somatic dysfunction of lumbar region: Secondary | ICD-10-CM | POA: Diagnosis not present

## 2017-12-25 DIAGNOSIS — M5416 Radiculopathy, lumbar region: Secondary | ICD-10-CM | POA: Diagnosis not present

## 2017-12-25 DIAGNOSIS — M9905 Segmental and somatic dysfunction of pelvic region: Secondary | ICD-10-CM | POA: Diagnosis not present

## 2017-12-25 DIAGNOSIS — M5431 Sciatica, right side: Secondary | ICD-10-CM | POA: Diagnosis not present

## 2017-12-29 DIAGNOSIS — I788 Other diseases of capillaries: Secondary | ICD-10-CM | POA: Diagnosis not present

## 2017-12-29 DIAGNOSIS — L812 Freckles: Secondary | ICD-10-CM | POA: Diagnosis not present

## 2017-12-29 DIAGNOSIS — Z1283 Encounter for screening for malignant neoplasm of skin: Secondary | ICD-10-CM | POA: Diagnosis not present

## 2017-12-29 DIAGNOSIS — D1800 Hemangioma unspecified site: Secondary | ICD-10-CM | POA: Diagnosis not present

## 2017-12-29 DIAGNOSIS — D225 Melanocytic nevi of trunk: Secondary | ICD-10-CM | POA: Diagnosis not present

## 2017-12-29 DIAGNOSIS — D229 Melanocytic nevi, unspecified: Secondary | ICD-10-CM | POA: Diagnosis not present

## 2017-12-29 DIAGNOSIS — L570 Actinic keratosis: Secondary | ICD-10-CM | POA: Diagnosis not present

## 2017-12-29 DIAGNOSIS — L821 Other seborrheic keratosis: Secondary | ICD-10-CM | POA: Diagnosis not present

## 2017-12-29 DIAGNOSIS — D2230 Melanocytic nevi of unspecified part of face: Secondary | ICD-10-CM | POA: Diagnosis not present

## 2018-01-22 DIAGNOSIS — M5431 Sciatica, right side: Secondary | ICD-10-CM | POA: Diagnosis not present

## 2018-01-22 DIAGNOSIS — M9905 Segmental and somatic dysfunction of pelvic region: Secondary | ICD-10-CM | POA: Diagnosis not present

## 2018-01-22 DIAGNOSIS — M9903 Segmental and somatic dysfunction of lumbar region: Secondary | ICD-10-CM | POA: Diagnosis not present

## 2018-01-22 DIAGNOSIS — M5416 Radiculopathy, lumbar region: Secondary | ICD-10-CM | POA: Diagnosis not present

## 2018-02-12 DIAGNOSIS — J019 Acute sinusitis, unspecified: Secondary | ICD-10-CM | POA: Diagnosis not present

## 2018-02-17 ENCOUNTER — Other Ambulatory Visit: Payer: Self-pay | Admitting: Family Medicine

## 2018-02-17 DIAGNOSIS — I10 Essential (primary) hypertension: Secondary | ICD-10-CM

## 2018-02-26 DIAGNOSIS — M5416 Radiculopathy, lumbar region: Secondary | ICD-10-CM | POA: Diagnosis not present

## 2018-02-26 DIAGNOSIS — M5431 Sciatica, right side: Secondary | ICD-10-CM | POA: Diagnosis not present

## 2018-02-26 DIAGNOSIS — M9903 Segmental and somatic dysfunction of lumbar region: Secondary | ICD-10-CM | POA: Diagnosis not present

## 2018-02-26 DIAGNOSIS — M9905 Segmental and somatic dysfunction of pelvic region: Secondary | ICD-10-CM | POA: Diagnosis not present

## 2018-02-27 ENCOUNTER — Encounter: Payer: Self-pay | Admitting: Internal Medicine

## 2018-02-27 ENCOUNTER — Ambulatory Visit: Payer: PPO | Admitting: Internal Medicine

## 2018-02-27 VITALS — BP 116/70 | HR 66 | Ht 69.0 in | Wt 171.0 lb

## 2018-02-27 DIAGNOSIS — I480 Paroxysmal atrial fibrillation: Secondary | ICD-10-CM | POA: Diagnosis not present

## 2018-02-27 DIAGNOSIS — I10 Essential (primary) hypertension: Secondary | ICD-10-CM

## 2018-02-27 DIAGNOSIS — E785 Hyperlipidemia, unspecified: Secondary | ICD-10-CM

## 2018-02-27 NOTE — Progress Notes (Signed)
Electrophysiology Office Note Date: 02/27/2018  ID:  Benjamin [name], DOB June 10, 1950, MRN [id number]  PCP: Jerrol Banana., MD  Electrophysiologist: Dr Rayann Heman  CC: Follow up for atrial fibrillation  Benjamin [name] is a 68 y.o. male seen today for Dr Rayann Heman.  He presents today for routine electrophysiology followup.  Since last being seen in our clinic, the patient reports doing very well.  He remains very active. He does report that he will have palpitations which last usually for only a few minutes after exercising. These occur less often than once per month. He has not had to use his PRN BB.  He denies chest pain, dyspnea, PND, orthopnea, nausea, vomiting, dizziness, syncope, edema, weight gain, or early satiety.  Past Medical History:  Diagnosis Date  . Atrial fibrillation (HCC)    paroxysmal  . Cancer ([place])    SCC removed from neck  . Cough syncope   . Diabetes mellitus   . Dyslipidemia   . Gout   . Hyperlipidemia   . Hypertension   . Hypopotassemia    Past Surgical History:  Procedure Laterality Date  . COLONOSCOPY WITH PROPOFOL N/A 11/07/2016   Procedure: COLONOSCOPY WITH PROPOFOL;  Surgeon: Lollie Sails, MD;  Location: [facility] ENDOSCOPY;  Service: Endoscopy;  Laterality: N/A;  . TONSILLECTOMY AND ADENOIDECTOMY      Current Outpatient Medications  Medication Sig Dispense Refill  . allopurinol (ZYLOPRIM) 300 MG tablet TAKE 2 TABLETS BY MOUTH DAILY 180 tablet 3  . Cholecalciferol (VITAMIN D3) 2000 UNITS TABS Take 1 tablet by mouth daily.    Marland Kitchen CINNAMON PO Take 2,000 mg by mouth daily.    Marland Kitchen glucose blood (ONETOUCH VERIO) test strip Check sugar once daily DX E11.9 30 each 12  . HYDROcodone-acetaminophen (NORCO/VICODIN) 5-325 MG tablet Take 0.5-1 tablets by mouth 2 (two) times daily as needed (pain). 60 tablet 0  . losartan (COZAAR) 25 MG tablet TAKE 1 TABLET DAILY 30 tablet 11  . metFORMIN (GLUCOPHAGE-XR) 500 MG 24 hr tablet TAKE TWO TABLETS TWICE A DA 120  tablet 11  . metoprolol tartrate (LOPRESSOR) 50 MG tablet Take one-half tablet (25 mg) by mouth twice daily as needed for palpitations and/or fast heart rates 60 tablet 3  . Omega-3 Fatty Acids (FISH OIL) 1000 MG CAPS Take 1 capsule by mouth daily.    . rivaroxaban (XARELTO) 20 MG TABS tablet TAKE 1 TABLET BY MOUTH DAILY WITH SUPPER 90 tablet 1  . rosuvastatin (CRESTOR) 10 MG tablet TAKE ONE TABLET BY MOUTH EVERY DAY 90 tablet 3  . scopolamine (TRANSDERM SCOP, 1.5 MG,) 1 MG/3DAYS Place 1 patch (1.5 mg total) onto the skin every 3 (three) days. 10 patch 12  . sildenafil (REVATIO) 20 MG tablet Take 1 tablet (20 mg total) by mouth 3 (three) times daily as needed. 50 tablet 3  . tadalafil (CIALIS) 5 MG tablet Take 1 tablet (5 mg total) by mouth See admin instructions. 1-3 every other day prn 50 tablet 11  . TURMERIC PO Take by mouth.    . vitamin B-12 (CYANOCOBALAMIN) 1000 MCG tablet Take 1,000 mcg by mouth daily.     No current facility-administered medications for this visit.     Allergies:   Patient has no known allergies.   Social History: Social History   Socioeconomic History  . Marital status: Married    Spouse name: Baldo Ash  . Number of children: 2  . Years of education: 16  . Highest education level:  Bachelor's degree (e.g., BA, AB, BS)  Occupational History  . Occupation: industrial paper products in [place]  . Financial resource strain: Not hard at all  . Food insecurity:    Worry: Never true    Inability: Never true  . Transportation needs:    Medical: No    Non-medical: No  Tobacco Use  . Smoking status: Former Smoker    Types: Cigarettes    Last attempt to quit: 01/23/1971    Years since quitting: 47.1  . Smokeless tobacco: Never Used  Substance and Sexual Activity  . Alcohol use: Yes    Alcohol/week: 4.0 standard drinks    Types: 4 Glasses of wine per week    Comment: only on weekends  . Drug use: No  . Sexual activity: Yes  Lifestyle  .  Physical activity:    Days per week: Not on file    Minutes per session: Not on file  . Stress: Only a little  Relationships  . Social connections:    Talks on phone: Not on file    Gets together: Not on file    Attends religious service: Not on file    Active member of club or organization: Not on file    Attends meetings of clubs or organizations: Not on file    Relationship status: Not on file  . Intimate partner violence:    Fear of current or ex partner: No    Emotionally abused: No    Physically abused: No    Forced sexual activity: No  Other Topics Concern  . Not on file  Social History Narrative    SOCIAL HISTORY:  He lives in [place] with his wife.  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  He has 2 adult sons.  He exercises as stated above.  No tobacco or illicit substance use.  He does enjoy occasional beer and vodka, mixed drink on the weekends.  He tries to follow a low-cholesterol diet.          FAMILY HISTORY:  Mother is alive, she has a pacemaker secondary to heart block, she also has a stable abdominal aneurysm.  Father's health, interesting that he had an MI at age 87 and then apparently died in his sleep at age 5 that was felt to be secondary to MI.  He has 3 siblings who are alive and well.     Family History: Family History  Problem Relation Age of Onset  . Atrial fibrillation Mother   . Ovarian cancer Mother   . Congestive Heart Failure Mother   . Aneurysm Mother   . Heart attack Father   . Diabetes Father   . Hypertension Father   . Ulcers Father   . Gout Father   . CAD Father   . Other Son        ITP  . Gout Son   . Other Son        ITP  . Hearing loss Son     Review of Systems: All other systems reviewed and are otherwise negative except as noted above.   Physical Exam: VS:  BP 116/70   Pulse 66   Ht 5\' 9"  (1.753 m)   Wt 171 lb (77.6 kg)   SpO2 99%   BMI 25.25 kg/m  , BMI Body mass index is 25.25 kg/m. Wt  Readings from Last 3 Encounters:  02/27/18 171 lb (77.6 kg)  10/16/17 165 lb (74.8 kg)  06/16/17 170 lb (77.1 kg)    GEN- The patient is well appearing, alert and oriented x 3 today.   HEENT: normocephalic, atraumatic; sclera clear, conjunctiva pink; hearing intact; oropharynx clear; neck supple, no JVP Lymph- no cervical lymphadenopathy Lungs- Clear to ausculation bilaterally, normal work of breathing.  No wheezes, rales, rhonchi Heart- Regular rate and rhythm, no murmurs, rubs or gallops, PMI not laterally displaced GI- soft, non-tender, non-distended, bowel sounds present, no hepatosplenomegaly Extremities- no clubbing, cyanosis, or edema; DP/PT/radial pulses 2+ bilaterally MS- no significant deformity or atrophy Skin- warm and dry, no rash or lesion  Psych- euthymic mood, full affect Neuro- strength and sensation are intact   EKG:  EKG is ordered today. The ekg ordered today shows SR HR 66, 1st degree AV block, PR 218, QRS 86, QTc 419  Recent Labs: No results found for requested labs within last 8760 hours.   Assessment and Plan:  1. Paroxysmal atrial fibrillation Appears to be maintaining SR. He reports infrequent, brief episodes of heart palpitations with activity which are not bothersome for the patient. Continue metoprolol 25 mg BID PRN for palpitations. Continue Xarelto 20 mg daily  This patients CHA2DS2-VASc Score and unadjusted Ischemic Stroke Rate (% per year) is equal to 3.2 % stroke rate/year from a score of 3  Above score calculated as 1 point each if present [CHF, HTN, DM, Vascular=MI/PAD/Aortic Plaque, Age if 65-74, or Male] Above score calculated as 2 points each if present [Age > 75, or Stroke/TIA/TE]   2. HTN Stable, no changes today  3. HLD Followed by PCP   Current medicines are reviewed at length with the patient today.   The patient does not have concerns regarding his medicines.  The following changes were made today:  none  Labs/ tests  ordered today include:  Orders Placed This Encounter  Procedures  . EKG 12-Lead     Disposition:   Follow up with Afib clinic in 1 year   Army Fossa 02/27/2018 11:09 AM   [place] [address] [phone] (office) [phone] (fax)

## 2018-02-27 NOTE — Patient Instructions (Addendum)
Medication Instructions:  Your physician recommends that you continue on your current medications as directed. Please refer to the Current Medication list given to you today.  Labwork: None ordered.  Testing/Procedures: None ordered.  Follow-Up: Your physician wants you to follow-up in: one year with Ricky with AFib clinic.   You will receive a reminder letter in the mail two months in advance. If you don't receive a letter, please call our office to schedule the follow-up appointment.  Any Other Special Instructions Will Be Listed Below (If Applicable).  If you need a refill on your cardiac medications before your next appointment, please call your pharmacy.

## 2018-03-02 ENCOUNTER — Encounter: Payer: Self-pay | Admitting: Family Medicine

## 2018-03-03 DIAGNOSIS — E663 Overweight: Secondary | ICD-10-CM | POA: Diagnosis not present

## 2018-03-03 DIAGNOSIS — M1A09X0 Idiopathic chronic gout, multiple sites, without tophus (tophi): Secondary | ICD-10-CM | POA: Diagnosis not present

## 2018-03-03 DIAGNOSIS — M79644 Pain in right finger(s): Secondary | ICD-10-CM | POA: Diagnosis not present

## 2018-03-03 DIAGNOSIS — M150 Primary generalized (osteo)arthritis: Secondary | ICD-10-CM | POA: Diagnosis not present

## 2018-03-03 DIAGNOSIS — M25511 Pain in right shoulder: Secondary | ICD-10-CM | POA: Diagnosis not present

## 2018-03-03 DIAGNOSIS — Z6825 Body mass index (BMI) 25.0-25.9, adult: Secondary | ICD-10-CM | POA: Diagnosis not present

## 2018-03-03 DIAGNOSIS — M79645 Pain in left finger(s): Secondary | ICD-10-CM | POA: Diagnosis not present

## 2018-03-19 ENCOUNTER — Ambulatory Visit (INDEPENDENT_AMBULATORY_CARE_PROVIDER_SITE_OTHER): Payer: PPO | Admitting: Family Medicine

## 2018-03-19 VITALS — BP 126/78 | HR 72 | Temp 98.1°F | Resp 16 | Ht 69.0 in | Wt 168.0 lb

## 2018-03-19 DIAGNOSIS — E119 Type 2 diabetes mellitus without complications: Secondary | ICD-10-CM

## 2018-03-19 DIAGNOSIS — Z0000 Encounter for general adult medical examination without abnormal findings: Secondary | ICD-10-CM

## 2018-03-19 DIAGNOSIS — E785 Hyperlipidemia, unspecified: Secondary | ICD-10-CM | POA: Diagnosis not present

## 2018-03-19 DIAGNOSIS — M79646 Pain in unspecified finger(s): Secondary | ICD-10-CM

## 2018-03-19 DIAGNOSIS — I10 Essential (primary) hypertension: Secondary | ICD-10-CM | POA: Diagnosis not present

## 2018-03-19 NOTE — Progress Notes (Signed)
Patient: Benjamin [name], Male    DOB: 1950/09/10, 68 y.o.   MRN: [id number] Visit Date: 03/19/2018  Today's Provider: Wilhemena Durie, MD   Chief Complaint  Patient presents with  . Annual Exam   Subjective:  Benjamin [name] is a 68 y.o. male who presents today for health maintenance and complete physical. He feels well. He reports exercising 6 times weekly. He reports he is sleeping fairly well. He is doing well with his habits.  He feels well.  He has been having problems with chronic right thumb pain in recent months.  His gout has been controlled. 11/07/16 Colonoscopy, Dr Lynnell Chad, hemorrhoids, repeat 10 years.  Review of Systems  Constitutional: Negative.   HENT: Negative.   Eyes: Negative.   Respiratory: Negative.   Cardiovascular: Negative.   Gastrointestinal: Negative.   Endocrine: Negative.   Genitourinary: Negative.   Musculoskeletal: Positive for arthralgias.  Skin: Negative.   Allergic/Immunologic: Negative.   Neurological: Negative.   Hematological: Negative.   Psychiatric/Behavioral: Negative.     Social History   Socioeconomic History  . Marital status: Married    Spouse name: Baldo Ash  . Number of children: 2  . Years of education: 16  . Highest education level: Bachelor's degree (e.g., BA, AB, BS)  Occupational History  . Occupation: industrial paper products in [place]  . Financial resource strain: Not hard at all  . Food insecurity:    Worry: Never true    Inability: Never true  . Transportation needs:    Medical: No    Non-medical: No  Tobacco Use  . Smoking status: Former Smoker    Types: Cigarettes    Last attempt to quit: 01/23/1971    Years since quitting: 47.1  . Smokeless tobacco: Never Used  Substance and Sexual Activity  . Alcohol use: Yes    Alcohol/week: 4.0 standard drinks    Types: 4 Glasses of wine per week    Comment: only on weekends  . Drug use: No  . Sexual activity: Yes  Lifestyle  .  Physical activity:    Days per week: Not on file    Minutes per session: Not on file  . Stress: Only a little  Relationships  . Social connections:    Talks on phone: Not on file    Gets together: Not on file    Attends religious service: Not on file    Active member of club or organization: Not on file    Attends meetings of clubs or organizations: Not on file    Relationship status: Not on file  . Intimate partner violence:    Fear of current or ex partner: No    Emotionally abused: No    Physically abused: No    Forced sexual activity: No  Other Topics Concern  . Not on file  Social History Narrative    SOCIAL HISTORY:  He lives in [place] with his wife.  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  He has 2 adult sons.  He exercises as stated above.  No tobacco or illicit substance use.  He does enjoy occasional beer and vodka, mixed drink on the weekends.  He tries to follow a low-cholesterol diet.          FAMILY HISTORY:  Mother is alive, she has a pacemaker secondary to heart block, she also has a stable abdominal aneurysm.  Father's health, interesting that he had an MI at age 81 and  then apparently died in his sleep at age 31 that was felt to be secondary to MI.  He has 3 siblings who are alive and well.     Patient Active Problem List   Diagnosis Date Noted  . Hypertension   . Hyperlipidemia   . Dyslipidemia   . Cough syncope   . Allergic rhinitis 07/04/2014  . Arthritis 07/04/2014  . Back pain, chronic 07/04/2014  . Abnormal glucose tolerance test 07/04/2014  . HLD (hyperlipidemia) 07/04/2014  . Raynaud's syndrome 07/04/2014  . Diabetes mellitus, type 2 ([place]) 07/04/2014  . HYPERTENSION, BENIGN 02/20/2009  . UNSPECIFIED ACUTE PERICARDITIS 01/23/2009  . ATRIAL FIBRILLATION 01/23/2009  . COUGH SYNCOPE 01/23/2009  . DYSLIPIDEMIA 01/20/2009  . Gout 01/20/2009    Past Surgical History:  Procedure Laterality Date  . COLONOSCOPY WITH PROPOFOL N/A  11/07/2016   Procedure: COLONOSCOPY WITH PROPOFOL;  Surgeon: Lollie Sails, MD;  Location: [facility] ENDOSCOPY;  Service: Endoscopy;  Laterality: N/A;  . TONSILLECTOMY AND ADENOIDECTOMY      His family history includes Aneurysm in his mother; Atrial fibrillation in his mother; CAD in his father; Congestive Heart Failure in his mother; Diabetes in his father; Gout in his father and son; Hearing loss in his son; Heart attack in his father; Hypertension in his father; Other in his son and son; Ovarian cancer in his mother; Ulcers in his father.     Outpatient Encounter Medications as of 03/19/2018  Medication Sig  . allopurinol (ZYLOPRIM) 300 MG tablet TAKE 2 TABLETS BY MOUTH DAILY  . Cholecalciferol (VITAMIN D3) 2000 UNITS TABS Take 1 tablet by mouth daily.  Marland Kitchen CINNAMON PO Take 2,000 mg by mouth daily.  Marland Kitchen glucose blood (ONETOUCH VERIO) test strip Check sugar once daily DX E11.9  . losartan (COZAAR) 25 MG tablet TAKE 1 TABLET DAILY  . metFORMIN (GLUCOPHAGE-XR) 500 MG 24 hr tablet TAKE TWO TABLETS TWICE A DA  . metoprolol tartrate (LOPRESSOR) 50 MG tablet Take one-half tablet (25 mg) by mouth twice daily as needed for palpitations and/or fast heart rates  . Omega-3 Fatty Acids (FISH OIL) 1000 MG CAPS Take 1 capsule by mouth daily.  . rivaroxaban (XARELTO) 20 MG TABS tablet TAKE 1 TABLET BY MOUTH DAILY WITH SUPPER  . rosuvastatin (CRESTOR) 10 MG tablet TAKE ONE TABLET BY MOUTH EVERY DAY  . sildenafil (REVATIO) 20 MG tablet Take 1 tablet (20 mg total) by mouth 3 (three) times daily as needed.  . TURMERIC PO Take by mouth.  . vitamin B-12 (CYANOCOBALAMIN) 1000 MCG tablet Take 1,000 mcg by mouth daily.  . [DISCONTINUED] HYDROcodone-acetaminophen (NORCO/VICODIN) 5-325 MG tablet Take 0.5-1 tablets by mouth 2 (two) times daily as needed (pain).  . [DISCONTINUED] scopolamine (TRANSDERM SCOP, 1.5 MG,) 1 MG/3DAYS Place 1 patch (1.5 mg total) onto the skin every 3 (three) days.  . [DISCONTINUED] tadalafil  (CIALIS) 5 MG tablet Take 1 tablet (5 mg total) by mouth See admin instructions. 1-3 every other day prn   No facility-administered encounter medications on file as of 03/19/2018.     Patient Care Team: Jerrol Banana., MD as PCP - General (Family Medicine) Birder Robson, MD as Referring Physician (Ophthalmology) Thompson Grayer, MD as Consulting Physician (Cardiology) Ralene Bathe, MD as Consulting Physician (Dermatology) Prudencio Pair as Physician Assistant (Emergency Medicine)      Objective:   Vitals:  Vitals:   03/19/18 1418  BP: 126/78  Pulse: 72  Resp: 16  Temp: 98.1 F (36.7 C)  TempSrc: Oral  SpO2: 98%  Weight: 168 lb (76.2 kg)  Height: 5\' 9"  (1.753 m)    Physical Exam Constitutional:      Appearance: Normal appearance. He is normal weight.  HENT:     Head: Normocephalic and atraumatic.     Right Ear: Tympanic membrane, ear canal and external ear normal.     Left Ear: Tympanic membrane, ear canal and external ear normal.     Nose: Nose normal.     Mouth/Throat:     Mouth: Mucous membranes are dry.     Pharynx: Oropharynx is clear.  Eyes:     Extraocular Movements: Extraocular movements intact.     Conjunctiva/sclera: Conjunctivae normal.     Pupils: Pupils are equal, round, and reactive to light.  Neck:     Musculoskeletal: Normal range of motion and neck supple.  Cardiovascular:     Rate and Rhythm: Normal rate and regular rhythm.     Pulses: Normal pulses.     Heart sounds: Normal heart sounds.  Pulmonary:     Effort: Pulmonary effort is normal.     Breath sounds: Normal breath sounds.  Abdominal:     General: Abdomen is flat. Bowel sounds are normal.     Palpations: Abdomen is soft.  Genitourinary:    Penis: Normal.      Scrotum/Testes: Normal.     Prostate: Normal.     Rectum: Normal.  Musculoskeletal: Normal range of motion.  Skin:    General: Skin is warm and dry.  Neurological:     General: No focal deficit  present.     Mental Status: He is alert and oriented to person, place, and time. Mental status is at baseline.  Psychiatric:        Mood and Affect: Mood normal.        Behavior: Behavior normal.        Thought Content: Thought content normal.        Judgment: Judgment normal.      Depression Screen PHQ 2/9 Scores 03/19/2018 02/13/2017 02/13/2017 10/11/2015  PHQ - 2 Score 0 0 0 0  PHQ- 9 Score - 2 - -      Assessment & Plan:     Routine Health Maintenance and Physical Exam  Exercise Activities and Dietary recommendations Goals    . Increase Exercise     Recommend to start back at the gym for 3 days a week for 45 minutes. (previous regimen)       Immunization History  Administered Date(s) Administered  . Influenza, High Dose Seasonal PF 10/19/2015, 10/16/2017  . Influenza-Unspecified 11/12/2014  . Pneumococcal Conjugate-13 09/13/2014  . Pneumococcal Polysaccharide-23 02/06/2000, 06/07/2009  . Td 11/10/2003  . Tdap 10/10/2010  . Zoster 11/14/2010    Health Maintenance  Topic Date Due  . FOOT EXAM  02/13/2018  . PNA vac Low Risk Adult (2 of 2 - PPSV23) 09/13/2019 (Originally 09/13/2015)  . HEMOGLOBIN A1C  04/16/2018  . OPHTHALMOLOGY EXAM  09/18/2018  . TETANUS/TDAP  10/09/2020  . COLONOSCOPY  11/08/2026  . INFLUENZA VACCINE  Completed  . Hepatitis C Screening  Completed     Discussed health benefits of physical activity, and encouraged him to engage in regular exercise appropriate for his age and condition.  1. Annual physical exam   2. Essential hypertension  - CBC with Differential/Platelet - Comprehensive metabolic panel - TSH  3. Type 2 diabetes mellitus without complication, without long-term current use of insulin (HCC)  - Hemoglobin  A1c  4. Hyperlipidemia, unspecified hyperlipidemia type  - Lipid Panel With LDL/HDL Ratio  5. Thumb pain, unspecified laterality Refer to Dr. Amedeo Plenty for options.

## 2018-03-20 DIAGNOSIS — E119 Type 2 diabetes mellitus without complications: Secondary | ICD-10-CM | POA: Diagnosis not present

## 2018-03-20 DIAGNOSIS — E785 Hyperlipidemia, unspecified: Secondary | ICD-10-CM | POA: Diagnosis not present

## 2018-03-20 DIAGNOSIS — I10 Essential (primary) hypertension: Secondary | ICD-10-CM | POA: Diagnosis not present

## 2018-03-21 LAB — CBC WITH DIFFERENTIAL/PLATELET
Basophils Absolute: 0.1 10*3/uL (ref 0.0–0.2)
Basos: 1 %
EOS (ABSOLUTE): 0.2 10*3/uL (ref 0.0–0.4)
EOS: 2 %
HEMATOCRIT: 44.9 % (ref 37.5–51.0)
Hemoglobin: 15.2 g/dL (ref 13.0–17.7)
IMMATURE GRANS (ABS): 0 10*3/uL (ref 0.0–0.1)
IMMATURE GRANULOCYTES: 1 %
LYMPHS: 24 %
Lymphocytes Absolute: 2.1 10*3/uL (ref 0.7–3.1)
MCH: 29.1 pg (ref 26.6–33.0)
MCHC: 33.9 g/dL (ref 31.5–35.7)
MCV: 86 fL (ref 79–97)
Monocytes Absolute: 0.9 10*3/uL (ref 0.1–0.9)
Monocytes: 10 %
NEUTROS ABS: 5.5 10*3/uL (ref 1.4–7.0)
Neutrophils: 62 %
Platelets: 295 10*3/uL (ref 150–450)
RBC: 5.22 x10E6/uL (ref 4.14–5.80)
RDW: 13.1 % (ref 11.6–15.4)
WBC: 8.8 10*3/uL (ref 3.4–10.8)

## 2018-03-21 LAB — LIPID PANEL WITH LDL/HDL RATIO
CHOLESTEROL TOTAL: 168 mg/dL (ref 100–199)
HDL: 46 mg/dL (ref >39)
LDL CALC: 94 mg/dL (ref 0–99)
LDl/HDL Ratio: 2 ratio (ref 0.0–3.6)
Triglycerides: 141 mg/dL (ref 0–149)
VLDL CHOLESTEROL CAL: 28 mg/dL (ref 5–40)

## 2018-03-21 LAB — COMPREHENSIVE METABOLIC PANEL
A/G RATIO: 1.8 (ref 1.2–2.2)
ALT: 20 IU/L (ref 0–44)
AST: 20 IU/L (ref 0–40)
Albumin: 4.5 g/dL (ref 3.8–4.8)
Alkaline Phosphatase: 55 IU/L (ref 39–117)
BILIRUBIN TOTAL: 0.4 mg/dL (ref 0.0–1.2)
BUN/Creatinine Ratio: 26 — ABNORMAL HIGH (ref 10–24)
BUN: 31 mg/dL — ABNORMAL HIGH (ref 8–27)
CALCIUM: 9.8 mg/dL (ref 8.6–10.2)
CO2: 19 mmol/L — ABNORMAL LOW (ref 20–29)
Chloride: 102 mmol/L (ref 96–106)
Creatinine, Ser: 1.2 mg/dL (ref 0.76–1.27)
GFR calc Af Amer: 71 mL/min/{1.73_m2} (ref >59)
GFR, EST NON AFRICAN AMERICAN: 62 mL/min/{1.73_m2} (ref >59)
Globulin, Total: 2.5 g/dL (ref 1.5–4.5)
Glucose: 148 mg/dL — ABNORMAL HIGH (ref 65–99)
POTASSIUM: 4.6 mmol/L (ref 3.5–5.2)
Sodium: 139 mmol/L (ref 134–144)
TOTAL PROTEIN: 7 g/dL (ref 6.0–8.5)

## 2018-03-21 LAB — TSH: TSH: 2.41 u[IU]/mL (ref 0.450–4.500)

## 2018-03-21 LAB — HEMOGLOBIN A1C
ESTIMATED AVERAGE GLUCOSE: 148 mg/dL
Hgb A1c MFr Bld: 6.8 % — ABNORMAL HIGH (ref 4.8–5.6)

## 2018-03-23 DIAGNOSIS — M25641 Stiffness of right hand, not elsewhere classified: Secondary | ICD-10-CM | POA: Diagnosis not present

## 2018-03-23 DIAGNOSIS — M25541 Pain in joints of right hand: Secondary | ICD-10-CM | POA: Diagnosis not present

## 2018-03-23 DIAGNOSIS — M25542 Pain in joints of left hand: Secondary | ICD-10-CM | POA: Diagnosis not present

## 2018-03-23 DIAGNOSIS — M25642 Stiffness of left hand, not elsewhere classified: Secondary | ICD-10-CM | POA: Diagnosis not present

## 2018-03-26 DIAGNOSIS — M5431 Sciatica, right side: Secondary | ICD-10-CM | POA: Diagnosis not present

## 2018-03-26 DIAGNOSIS — M5416 Radiculopathy, lumbar region: Secondary | ICD-10-CM | POA: Diagnosis not present

## 2018-03-26 DIAGNOSIS — M9905 Segmental and somatic dysfunction of pelvic region: Secondary | ICD-10-CM | POA: Diagnosis not present

## 2018-03-26 DIAGNOSIS — M9903 Segmental and somatic dysfunction of lumbar region: Secondary | ICD-10-CM | POA: Diagnosis not present

## 2018-03-30 DIAGNOSIS — M25642 Stiffness of left hand, not elsewhere classified: Secondary | ICD-10-CM | POA: Diagnosis not present

## 2018-03-30 DIAGNOSIS — M25541 Pain in joints of right hand: Secondary | ICD-10-CM | POA: Diagnosis not present

## 2018-03-30 DIAGNOSIS — M25641 Stiffness of right hand, not elsewhere classified: Secondary | ICD-10-CM | POA: Diagnosis not present

## 2018-03-30 DIAGNOSIS — M25542 Pain in joints of left hand: Secondary | ICD-10-CM | POA: Diagnosis not present

## 2018-04-06 DIAGNOSIS — M25541 Pain in joints of right hand: Secondary | ICD-10-CM | POA: Diagnosis not present

## 2018-04-06 DIAGNOSIS — M25641 Stiffness of right hand, not elsewhere classified: Secondary | ICD-10-CM | POA: Diagnosis not present

## 2018-04-06 DIAGNOSIS — M25642 Stiffness of left hand, not elsewhere classified: Secondary | ICD-10-CM | POA: Diagnosis not present

## 2018-04-06 DIAGNOSIS — M25542 Pain in joints of left hand: Secondary | ICD-10-CM | POA: Diagnosis not present

## 2018-04-09 DIAGNOSIS — M25642 Stiffness of left hand, not elsewhere classified: Secondary | ICD-10-CM | POA: Diagnosis not present

## 2018-04-09 DIAGNOSIS — M25641 Stiffness of right hand, not elsewhere classified: Secondary | ICD-10-CM | POA: Diagnosis not present

## 2018-04-09 DIAGNOSIS — M25542 Pain in joints of left hand: Secondary | ICD-10-CM | POA: Diagnosis not present

## 2018-04-09 DIAGNOSIS — M25541 Pain in joints of right hand: Secondary | ICD-10-CM | POA: Diagnosis not present

## 2018-04-13 DIAGNOSIS — M25642 Stiffness of left hand, not elsewhere classified: Secondary | ICD-10-CM | POA: Diagnosis not present

## 2018-04-13 DIAGNOSIS — M25541 Pain in joints of right hand: Secondary | ICD-10-CM | POA: Diagnosis not present

## 2018-04-13 DIAGNOSIS — M25542 Pain in joints of left hand: Secondary | ICD-10-CM | POA: Diagnosis not present

## 2018-04-13 DIAGNOSIS — M25641 Stiffness of right hand, not elsewhere classified: Secondary | ICD-10-CM | POA: Diagnosis not present

## 2018-04-23 DIAGNOSIS — M79642 Pain in left hand: Secondary | ICD-10-CM | POA: Diagnosis not present

## 2018-04-23 DIAGNOSIS — M79641 Pain in right hand: Secondary | ICD-10-CM | POA: Diagnosis not present

## 2018-04-23 DIAGNOSIS — M5431 Sciatica, right side: Secondary | ICD-10-CM | POA: Diagnosis not present

## 2018-04-23 DIAGNOSIS — M545 Low back pain: Secondary | ICD-10-CM | POA: Diagnosis not present

## 2018-04-30 DIAGNOSIS — M9905 Segmental and somatic dysfunction of pelvic region: Secondary | ICD-10-CM | POA: Diagnosis not present

## 2018-04-30 DIAGNOSIS — M5431 Sciatica, right side: Secondary | ICD-10-CM | POA: Diagnosis not present

## 2018-04-30 DIAGNOSIS — M9903 Segmental and somatic dysfunction of lumbar region: Secondary | ICD-10-CM | POA: Diagnosis not present

## 2018-04-30 DIAGNOSIS — M5416 Radiculopathy, lumbar region: Secondary | ICD-10-CM | POA: Diagnosis not present

## 2018-05-12 ENCOUNTER — Other Ambulatory Visit: Payer: Self-pay | Admitting: Family Medicine

## 2018-05-12 DIAGNOSIS — E118 Type 2 diabetes mellitus with unspecified complications: Secondary | ICD-10-CM

## 2018-05-14 ENCOUNTER — Other Ambulatory Visit: Payer: Self-pay | Admitting: Internal Medicine

## 2018-05-14 NOTE — Telephone Encounter (Signed)
Xarelto 20mg  refill request received; pt is 68 yrs old, wt-76.2kg, Crea-1.20 on 03/20/2018, last seen by [name] on 02/27/2018, CrCl-63.70ml/min; will send in refill.

## 2018-06-15 DIAGNOSIS — D2230 Melanocytic nevi of unspecified part of face: Secondary | ICD-10-CM | POA: Diagnosis not present

## 2018-06-15 DIAGNOSIS — L570 Actinic keratosis: Secondary | ICD-10-CM | POA: Diagnosis not present

## 2018-06-15 DIAGNOSIS — L578 Other skin changes due to chronic exposure to nonionizing radiation: Secondary | ICD-10-CM | POA: Diagnosis not present

## 2018-06-15 DIAGNOSIS — D225 Melanocytic nevi of trunk: Secondary | ICD-10-CM | POA: Diagnosis not present

## 2018-06-15 DIAGNOSIS — Z1283 Encounter for screening for malignant neoplasm of skin: Secondary | ICD-10-CM | POA: Diagnosis not present

## 2018-06-15 DIAGNOSIS — Z85828 Personal history of other malignant neoplasm of skin: Secondary | ICD-10-CM | POA: Diagnosis not present

## 2018-06-15 DIAGNOSIS — L821 Other seborrheic keratosis: Secondary | ICD-10-CM | POA: Diagnosis not present

## 2018-06-15 DIAGNOSIS — L820 Inflamed seborrheic keratosis: Secondary | ICD-10-CM | POA: Diagnosis not present

## 2018-06-15 DIAGNOSIS — L812 Freckles: Secondary | ICD-10-CM | POA: Diagnosis not present

## 2018-06-15 DIAGNOSIS — D1800 Hemangioma unspecified site: Secondary | ICD-10-CM | POA: Diagnosis not present

## 2018-06-15 DIAGNOSIS — D485 Neoplasm of uncertain behavior of skin: Secondary | ICD-10-CM | POA: Diagnosis not present

## 2018-07-01 ENCOUNTER — Other Ambulatory Visit: Payer: Self-pay | Admitting: Family Medicine

## 2018-07-01 DIAGNOSIS — E1121 Type 2 diabetes mellitus with diabetic nephropathy: Secondary | ICD-10-CM

## 2018-07-01 MED ORDER — LOSARTAN POTASSIUM 25 MG PO TABS: 25.0000 mg | ORAL_TABLET | Freq: Every day | ORAL | 3 refills | Status: DC

## 2018-07-01 NOTE — Telephone Encounter (Signed)
[facility] faxed refill request for the following medications: ° °losartan (COZAAR) 25 MG tablet  ° °Please advise. ° °

## 2018-08-24 DIAGNOSIS — L821 Other seborrheic keratosis: Secondary | ICD-10-CM | POA: Diagnosis not present

## 2018-08-24 DIAGNOSIS — Z872 Personal history of diseases of the skin and subcutaneous tissue: Secondary | ICD-10-CM | POA: Diagnosis not present

## 2018-09-12 ENCOUNTER — Other Ambulatory Visit: Payer: Self-pay | Admitting: Family Medicine

## 2018-09-17 ENCOUNTER — Other Ambulatory Visit: Payer: Self-pay

## 2018-09-17 ENCOUNTER — Ambulatory Visit (INDEPENDENT_AMBULATORY_CARE_PROVIDER_SITE_OTHER): Payer: PPO | Admitting: Family Medicine

## 2018-09-17 ENCOUNTER — Encounter: Payer: Self-pay | Admitting: Family Medicine

## 2018-09-17 VITALS — BP 110/60 | HR 58 | Temp 97.5°F | Resp 15 | Wt 170.8 lb

## 2018-09-17 DIAGNOSIS — E785 Hyperlipidemia, unspecified: Secondary | ICD-10-CM | POA: Diagnosis not present

## 2018-09-17 DIAGNOSIS — E119 Type 2 diabetes mellitus without complications: Secondary | ICD-10-CM

## 2018-09-17 DIAGNOSIS — I10 Essential (primary) hypertension: Secondary | ICD-10-CM

## 2018-09-17 DIAGNOSIS — I482 Chronic atrial fibrillation, unspecified: Secondary | ICD-10-CM

## 2018-09-17 DIAGNOSIS — M109 Gout, unspecified: Secondary | ICD-10-CM

## 2018-09-17 LAB — POCT GLYCOSYLATED HEMOGLOBIN (HGB A1C): Hemoglobin A1C: 6.5 % — AB (ref 4.0–5.6)

## 2018-09-17 NOTE — Progress Notes (Signed)
Patient: Benjamin [name] Male    DOB: 08-01-1950   68 y.o.   MRN: [id number] Visit Date: 09/17/2018  Today's Provider: Wilhemena Durie, MD   Chief Complaint  Patient presents with  . Diabetes  . Hypertension  . Hyperlipidemia   Subjective:     HPI  Diabetes Mellitus Type II, Follow-up:   Lab Results  Component Value Date   HGBA1C 6.5 (A) 09/17/2018   HGBA1C 6.8 (H) 03/20/2018   HGBA1C 6.4 (A) 10/16/2017   Last seen for diabetes 6 months ago.  Management since then includes none. He reports excellent compliance with treatment. He is not having side effects.  Current symptoms include none and have been unchanged. Home blood sugar records: 115-140  Episodes of hypoglycemia? no   Current Insulin Regimen: N/A /Most Recent Eye Exam: 09/17/2017 Weight trend: stable Prior visit with dietician: no Current diet: well balanced Current exercise: working out on tredmill and has a Physiological scientist  ------------------------------------------------------------------------   Hypertension, follow-up:  BP Readings from Last 3 Encounters:  09/17/18 110/60  03/19/18 126/78  02/27/18 116/70    He was last seen for hypertension 6 months ago.  BP at that visit was 126/78. Management since that visit includes none.He reports excellent compliance with treatment. He is not having side effects.  He is exercising. He is adherent to low salt diet.   Outside blood pressures are being checked periodically. He is experiencing none.  Patient denies chest pain, chest pressure/discomfort, claudication, dyspnea, exertional chest pressure/discomfort, fatigue, irregular heart beat, lower extremity edema, near-syncope, orthopnea, palpitations, paroxysmal nocturnal dyspnea, syncope and tachypnea.   Cardiovascular risk factors include advanced age (older than 12 for men, 37 for women), diabetes mellitus, hypertension and male gender.  Use of agents associated with hypertension: none.    ------------------------------------------------------------------------    Lipid/Cholesterol, Follow-up:   Last seen for this 6 months ago.  Management since that visit includes none.  Last Lipid Panel:    Component Value Date/Time   CHOL 168 03/20/2018 0805   TRIG 141 03/20/2018 0805   HDL 46 03/20/2018 0805   CHOLHDL 3.6 02/13/2017 1031   CHOLHDL 6.0 01/06/2009 0630   VLDL 56 (H) 01/06/2009 0630   LDLCALC 94 03/20/2018 0805    He reports excellent compliance with treatment. He is not having side effects.   Wt Readings from Last 3 Encounters:  09/17/18 170 lb 12.8 oz (77.5 kg)  03/19/18 168 lb (76.2 kg)  02/27/18 171 lb (77.6 kg)    ------------------------------------------------------------------------  No Known Allergies   Current Outpatient Medications:  .  allopurinol (ZYLOPRIM) 300 MG tablet, TAKE 2 TABLETS BY MOUTH DAILY, Disp: 180 tablet, Rfl: 3 .  Cholecalciferol (VITAMIN D3) 2000 UNITS TABS, Take 1 tablet by mouth daily., Disp: , Rfl:  .  CINNAMON PO, Take 2,000 mg by mouth daily., Disp: , Rfl:  .  glucose blood (ONETOUCH VERIO) test strip, Check sugar once daily DX E11.9, Disp: 30 each, Rfl: 12 .  losartan (COZAAR) 25 MG tablet, Take 1 tablet (25 mg total) by mouth daily., Disp: 30 tablet, Rfl: 3 .  metFORMIN (GLUCOPHAGE-XR) 500 MG 24 hr tablet, TAKE TWO TABLETS BY MOUTH TWICE DAILY, Disp: 120 tablet, Rfl: 11 .  metoprolol tartrate (LOPRESSOR) 50 MG tablet, Take one-half tablet (25 mg) by mouth twice daily as needed for palpitations and/or fast heart rates, Disp: 60 tablet, Rfl: 3 .  Omega-3 Fatty Acids (FISH OIL) 1000 MG CAPS, Take 1 capsule by  mouth daily., Disp: , Rfl:  .  rosuvastatin (CRESTOR) 10 MG tablet, TAKE ONE TABLET BY MOUTH EVERY DAY, Disp: 90 tablet, Rfl: 3 .  sildenafil (REVATIO) 20 MG tablet, Take 1 tablet (20 mg total) by mouth 3 (three) times daily as needed., Disp: 50 tablet, Rfl: 3 .  TURMERIC PO, Take by mouth., Disp: , Rfl:  .   vitamin B-12 (CYANOCOBALAMIN) 1000 MCG tablet, Take 1,000 mcg by mouth daily., Disp: , Rfl:  .  XARELTO 20 MG TABS tablet, Take 1 tablet by mouth daily with supper, Disp: 90 tablet, Rfl: 2  Review of Systems  Constitutional: Negative for activity change, appetite change, chills, diaphoresis, fatigue, fever and unexpected weight change.  Eyes: Negative.   Respiratory: Negative for cough and shortness of breath.   Cardiovascular: Negative for chest pain, palpitations and leg swelling.  Endocrine: Negative for cold intolerance, heat intolerance, polydipsia, polyphagia and polyuria.  Musculoskeletal: Positive for arthralgias.  Allergic/Immunologic: Negative.   Neurological: Negative for dizziness, light-headedness and headaches.  Psychiatric/Behavioral: Negative.     Social History   Tobacco Use  . Smoking status: Former Smoker    Types: Cigarettes    Quit date: 01/23/1971    Years since quitting: 47.6  . Smokeless tobacco: Never Used  Substance Use Topics  . Alcohol use: Yes    Alcohol/week: 4.0 standard drinks    Types: 4 Glasses of wine per week    Comment: only on weekends      Objective:   BP 110/60   Pulse (!) 58   Temp (!) 97.5 F (36.4 C) (Oral)   Resp 15   Wt 170 lb 12.8 oz (77.5 kg)   SpO2 98%   BMI 25.22 kg/m  Vitals:   09/17/18 0819  BP: 110/60  Pulse: (!) 58  Resp: 15  Temp: (!) 97.5 F (36.4 C)  TempSrc: Oral  SpO2: 98%  Weight: 170 lb 12.8 oz (77.5 kg)     Physical Exam Vitals signs reviewed.  Constitutional:      Appearance: He is well-developed.  HENT:     Head: Normocephalic and atraumatic.     Right Ear: External ear normal.     Left Ear: External ear normal.     Nose: Nose normal.  Eyes:     Conjunctiva/sclera: Conjunctivae normal.  Neck:     Thyroid: No thyromegaly.  Cardiovascular:     Rate and Rhythm: Normal rate and regular rhythm.     Heart sounds: Normal heart sounds.  Pulmonary:     Effort: Pulmonary effort is normal.      Breath sounds: Normal breath sounds.  Abdominal:     Palpations: Abdomen is soft.  Skin:    General: Skin is warm and dry.  Neurological:     Mental Status: He is alert and oriented to person, place, and time.  Psychiatric:        Behavior: Behavior normal.        Thought Content: Thought content normal.        Judgment: Judgment normal.      Results for orders placed or performed in visit on 09/17/18  POCT glycosylated hemoglobin (Hb A1C)  Result Value Ref Range   Hemoglobin A1C 6.5 (A) 4.0 - 5.6 %   HbA1c POC (<> result, manual entry)     HbA1c, POC (prediabetic range)     HbA1c, POC (controlled diabetic range)         Assessment & Plan    1. Type  2 diabetes mellitus without complication, without long-term current use of insulin (HCC)  - POCT glycosylated hemoglobin (Hb A1C)--6.5--was 6.8--good control.  2. HYPERTENSION, BENIGN   3. Chronic atrial fibrillation On xarelto.  4. Hyperlipidemia, unspecified hyperlipidemia type   5. Gout, unspecified cause, unspecified chronicity, unspecified site      Wilhemena Durie, MD  [facility] Fritzi Mandes [place] as a scribe for Wilhemena Durie, MD.,have documented all relevant documentation on the behalf of Wilhemena Durie, MD,as directed by  Wilhemena Durie, MD while in the presence of Wilhemena Durie, MD.

## 2018-10-02 ENCOUNTER — Other Ambulatory Visit: Payer: Self-pay | Admitting: Family Medicine

## 2018-10-02 DIAGNOSIS — E118 Type 2 diabetes mellitus with unspecified complications: Secondary | ICD-10-CM

## 2018-10-07 DIAGNOSIS — E663 Overweight: Secondary | ICD-10-CM | POA: Diagnosis not present

## 2018-10-07 DIAGNOSIS — M79645 Pain in left finger(s): Secondary | ICD-10-CM | POA: Diagnosis not present

## 2018-10-07 DIAGNOSIS — M79644 Pain in right finger(s): Secondary | ICD-10-CM | POA: Diagnosis not present

## 2018-10-07 DIAGNOSIS — M25511 Pain in right shoulder: Secondary | ICD-10-CM | POA: Diagnosis not present

## 2018-10-07 DIAGNOSIS — Z6825 Body mass index (BMI) 25.0-25.9, adult: Secondary | ICD-10-CM | POA: Diagnosis not present

## 2018-10-07 DIAGNOSIS — M150 Primary generalized (osteo)arthritis: Secondary | ICD-10-CM | POA: Diagnosis not present

## 2018-10-07 DIAGNOSIS — M1A09X0 Idiopathic chronic gout, multiple sites, without tophus (tophi): Secondary | ICD-10-CM | POA: Diagnosis not present

## 2018-11-03 ENCOUNTER — Other Ambulatory Visit: Payer: Self-pay

## 2018-11-03 ENCOUNTER — Ambulatory Visit (INDEPENDENT_AMBULATORY_CARE_PROVIDER_SITE_OTHER): Payer: PPO

## 2018-11-03 DIAGNOSIS — Z23 Encounter for immunization: Secondary | ICD-10-CM

## 2019-01-04 ENCOUNTER — Other Ambulatory Visit: Payer: Self-pay | Admitting: Family Medicine

## 2019-01-04 DIAGNOSIS — E1121 Type 2 diabetes mellitus with diabetic nephropathy: Secondary | ICD-10-CM

## 2019-02-15 ENCOUNTER — Other Ambulatory Visit: Payer: Self-pay | Admitting: Internal Medicine

## 2019-02-15 NOTE — Telephone Encounter (Signed)
Pt last saw Dr Rayann Heman 02/27/18, Pt has pending appt on 03/03/19 with Malka So, PA. Last labs 03/20/18 Creat 1.20, age 69, weight 77.5kg, CrCl 63.69, based on specified criteria pt is on appropriate dosage of Xarelto 20mg  QD.  Will refill rx.

## 2019-03-03 ENCOUNTER — Encounter (HOSPITAL_COMMUNITY): Payer: Self-pay | Admitting: Physician Assistant

## 2019-03-03 ENCOUNTER — Ambulatory Visit (HOSPITAL_COMMUNITY)
Admission: RE | Admit: 2019-03-03 | Discharge: 2019-03-03 | Disposition: A | Discharge status: Home / Self Care | Payer: PPO | Source: Ambulatory Visit | Attending: Physician Assistant | Admitting: Physician Assistant

## 2019-03-03 ENCOUNTER — Other Ambulatory Visit: Payer: Self-pay

## 2019-03-03 VITALS — BP 130/76 | HR 73 | Ht 69.0 in | Wt 172.0 lb

## 2019-03-03 DIAGNOSIS — Z87891 Personal history of nicotine dependence: Secondary | ICD-10-CM | POA: Insufficient documentation

## 2019-03-03 DIAGNOSIS — Z7984 Long term (current) use of oral hypoglycemic drugs: Secondary | ICD-10-CM | POA: Insufficient documentation

## 2019-03-03 DIAGNOSIS — I480 Paroxysmal atrial fibrillation: Secondary | ICD-10-CM | POA: Diagnosis not present

## 2019-03-03 DIAGNOSIS — Z79899 Other long term (current) drug therapy: Secondary | ICD-10-CM | POA: Diagnosis not present

## 2019-03-03 DIAGNOSIS — E118 Type 2 diabetes mellitus with unspecified complications: Secondary | ICD-10-CM | POA: Diagnosis not present

## 2019-03-03 DIAGNOSIS — E785 Hyperlipidemia, unspecified: Secondary | ICD-10-CM | POA: Insufficient documentation

## 2019-03-03 DIAGNOSIS — E876 Hypokalemia: Secondary | ICD-10-CM | POA: Diagnosis not present

## 2019-03-03 DIAGNOSIS — D6869 Other thrombophilia: Secondary | ICD-10-CM | POA: Diagnosis not present

## 2019-03-03 DIAGNOSIS — Z7901 Long term (current) use of anticoagulants: Secondary | ICD-10-CM | POA: Diagnosis not present

## 2019-03-03 DIAGNOSIS — M109 Gout, unspecified: Secondary | ICD-10-CM | POA: Insufficient documentation

## 2019-03-03 DIAGNOSIS — I10 Essential (primary) hypertension: Secondary | ICD-10-CM | POA: Diagnosis not present

## 2019-03-03 NOTE — Progress Notes (Signed)
Primary Care Physician: Jerrol Banana., MD Primary Electrophysiologist: Dr Rayann Heman Referring Physician: Dr Stefani Dama is a 69 y.o. male with a history of DM, HLD, HTN, gout, and paroxysmal atrial fibrillation who presents for follow up in the [facility]. Patient is on Xarelto for a CHADS2VASC score of 3. Patient reports that he has done very well since his last visit. He continues to be very active by exercising regularly. He has had infrequent, brief episodes of palpitations which do not last longer than one minute. He is tolerating the Xarelto without difficulty.  Today, he denies symptoms of palpitations, chest pain, shortness of breath, orthopnea, PND, lower extremity edema, dizziness, presyncope, syncope, snoring, daytime somnolence, bleeding, or neurologic sequela. The patient is tolerating medications without difficulties and is otherwise without complaint today.    Atrial Fibrillation Risk Factors:  he does not have symptoms or diagnosis of sleep apnea. he does not have a history of rheumatic fever.   he has a BMI of Body mass index is 25.4 kg/m.Marland Kitchen Filed Weights   03/03/19 0830  Weight: 78 kg    Family History  Problem Relation Age of Onset  . Atrial fibrillation Mother   . Ovarian cancer Mother   . Congestive Heart Failure Mother   . Aneurysm Mother   . Heart attack Father   . Diabetes Father   . Hypertension Father   . Ulcers Father   . Gout Father   . CAD Father   . Other Son        ITP  . Gout Son   . Other Son        ITP  . Hearing loss Son      Atrial Fibrillation Management history:  Previous antiarrhythmic drugs: none Previous cardioversions: none Previous ablations: none CHADS2VASC score: 3 Anticoagulation history: Xarelto   Past Medical History:  Diagnosis Date  . Atrial fibrillation (HCC)    paroxysmal  . Cancer ([place])    SCC removed from neck  . Cough syncope   . Diabetes mellitus    . Dyslipidemia   . Gout   . Hyperlipidemia   . Hypertension   . Hypopotassemia    Past Surgical History:  Procedure Laterality Date  . COLONOSCOPY WITH PROPOFOL N/A 11/07/2016   Procedure: COLONOSCOPY WITH PROPOFOL;  Surgeon: Lollie Sails, MD;  Location: [facility] ENDOSCOPY;  Service: Endoscopy;  Laterality: N/A;  . TONSILLECTOMY AND ADENOIDECTOMY      Current Outpatient Medications  Medication Sig Dispense Refill  . allopurinol (ZYLOPRIM) 300 MG tablet TAKE 2 TABLETS BY MOUTH DAILY 180 tablet 3  . Cholecalciferol (VITAMIN D3) 2000 UNITS TABS Take 1 tablet by mouth daily.    Marland Kitchen CINNAMON PO Take 2,000 mg by mouth daily.    Marland Kitchen losartan (COZAAR) 25 MG tablet TAKE 1 TABLET BY MOUTH DAILY 90 tablet 0  . metFORMIN (GLUCOPHAGE-XR) 500 MG 24 hr tablet TAKE TWO TABLETS BY MOUTH TWICE DAILY 120 tablet 11  . metoprolol tartrate (LOPRESSOR) 50 MG tablet Take one-half tablet (25 mg) by mouth twice daily as needed for palpitations and/or fast heart rates 60 tablet 3  . Omega-3 Fatty Acids (FISH OIL) 1000 MG CAPS Take 1 capsule by mouth daily.    Glory Rosebush VERIO test strip CHECK BLOOD SUGAR DAILY 50 each 11  . rosuvastatin (CRESTOR) 10 MG tablet TAKE ONE TABLET BY MOUTH EVERY DAY 90 tablet 3  . sildenafil (REVATIO) 20 MG tablet Take  1 tablet (20 mg total) by mouth 3 (three) times daily as needed. 50 tablet 3  . TURMERIC PO Take by mouth.    . vitamin B-12 (CYANOCOBALAMIN) 1000 MCG tablet Take 1,000 mcg by mouth daily.    Alveda Reasons 20 MG TABS tablet Take 1 tablet by mouth daily with supper 90 tablet 1  . zinc gluconate 50 MG tablet Take 50 mg by mouth daily.     No current facility-administered medications for this encounter.    No Known Allergies  Social History   Socioeconomic History  . Marital status: Married    Spouse name: Baldo Ash  . Number of children: 2  . Years of education: 16  . Highest education level: Bachelor's degree (e.g., BA, AB, BS)  Occupational History  . 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 Smoking status: Former Smoker    Types: Cigarettes    Quit date: 01/23/1971    Years since quitting: 48.1  . Smokeless tobacco: Never Used  Substance and Sexual Activity  . Alcohol use: Yes    Alcohol/week: 1.0 - 2.0 standard drinks    Types: 1 - 2 Standard drinks or equivalent per week    Comment: only on weekends  . Drug use: No  . Sexual activity: Yes  Other Topics Concern  . Not on file  Social History Narrative    SOCIAL HISTORY:  He lives in [place] with his wife.  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  He has 2 adult sons.  He exercises as stated above.  No tobacco or illicit substance use.  He does enjoy occasional beer and vodka, mixed drink on the weekends.  He tries to follow a low-cholesterol diet.          FAMILY HISTORY:  Mother is alive, she has a pacemaker secondary to heart block, she also has a stable abdominal aneurysm.  Father's health, interesting that he had an MI at age 36 and then apparently died in his sleep at age 33 that was felt to be secondary to MI.  He has 3 siblings who are alive and well.    Social Determinants of Health   Financial Resource Strain:   . Difficulty of Paying Living Expenses: Not on file  Food Insecurity:   . Worried About Charity fundraiser in the Last Year: Not on file  . Ran Out of Food in the Last Year: Not on file  Transportation Needs:   . Lack of Transportation (Medical): Not on file  . Lack of Transportation (Non-Medical): Not on file  Physical Activity:   . Days of Exercise per Week: Not on file  . Minutes of Exercise per Session: Not on file  Stress:   . Feeling of Stress : Not on file  Social Connections:   . Frequency of Communication with Friends and Family: Not on file  . Frequency of Social Gatherings with Friends and Family: Not on file  . Attends Religious Services: Not on file  . Active Member of Clubs or Organizations: Not on file   . Attends Archivist Meetings: Not on file  . Marital Status: Not on file  Intimate Partner Violence:   . Fear of Current or Ex-Partner: Not on file  . Emotionally Abused: Not on file  . Physically Abused: Not on file  . Sexually Abused: Not on file     ROS- All systems are reviewed and negative except as  per the HPI above.  Physical Exam: Vitals:   03/03/19 0830  BP: 130/76  Pulse: 73  Weight: 78 kg  Height: 5\' 9"  (1.753 m)    GEN- The patient is well appearing, alert and oriented x 3 today.   Head- normocephalic, atraumatic Eyes-  Sclera clear, conjunctiva pink Ears- hearing intact Oropharynx- clear Neck- supple  Lungs- Clear to ausculation bilaterally, normal work of breathing Heart- Regular rate and rhythm, no murmurs, rubs or gallops  GI- soft, NT, ND, + BS Extremities- no clubbing, cyanosis, or edema MS- no significant deformity or atrophy Skin- no rash or lesion Psych- euthymic mood, full affect Neuro- strength and sensation are intact  Wt Readings from Last 3 Encounters:  03/03/19 78 kg  09/17/18 77.5 kg  03/19/18 76.2 kg    EKG today demonstrates SR HR 73, 1st degree AV block, PR 216, QRS 88, QTc 423  Echo 03/06/16 demonstrated  - Left ventricle: The cavity size was normal. Wall thickness was   normal. Systolic function was normal. The estimated ejection   fraction was in the range of 60% to 65%. Wall motion was normal;   there were no regional wall motion abnormalities. Doppler   parameters are consistent with abnormal left ventricular   relaxation (grade 1 diastolic dysfunction). - Aortic valve: There was no stenosis. - Aorta: Ascending aortic diameter: 38 mm (S). - Ascending aorta: The ascending aorta was mildly dilated. - Mitral valve: There was trivial regurgitation. - Right ventricle: The cavity size was normal. Systolic function   was normal. - Tricuspid valve: Peak RV-RA gradient (S): 18 mm Hg. - Pulmonary arteries: PA peak  pressure: 23 mm Hg (S). - Inferior vena cava: The vessel was normal in size. The   respirophasic diameter changes were in the normal range (>= 50%),   consistent with normal central venous pressure.  Impressions:  - Normal LV size with EF 60-65%. Normal RV size and systolic   function. No significant valvular abnormalities.  Epic records are reviewed at length today  CHA2DS2-VASc Score = 3 The patient's score is based upon: CHF History: No HTN History: Yes Age : 66-73 Diabetes History: Yes Stroke History: No Vascular Disease History: No Gender: Male      ASSESSMENT AND PLAN: 1. Paroxysmal Atrial Fibrillation (ICD10:  I48.0) The patient's CHA2DS2-VASc score is 3, indicating a 3.2% annual risk of stroke.   Patient doing well with minimal symptoms. Continue PRN Lopressor 25 mg q12 hours for heart racing.  2. Secondary Hypercoagulable State (ICD10:  D68.69) The patient is at significant risk for stroke/thromboembolism based upon his CHA2DS2-VASc Score of 3.  Continue Rivaroxaban (Xarelto).   3. HTN Stable, no changes today.   Follow up with Dr Rayann Heman in one year.   [facility] [address] [place], [place] [address] [phone] 03/03/2019 8:45 AM

## 2019-03-05 DIAGNOSIS — E119 Type 2 diabetes mellitus without complications: Secondary | ICD-10-CM | POA: Diagnosis not present

## 2019-03-05 LAB — HM DIABETES EYE EXAM

## 2019-03-13 ENCOUNTER — Ambulatory Visit: Payer: PPO

## 2019-03-18 ENCOUNTER — Encounter: Payer: Self-pay | Admitting: *Deleted

## 2019-03-18 ENCOUNTER — Ambulatory Visit: Payer: PPO

## 2019-03-24 NOTE — Progress Notes (Signed)
Patient: Benjamin [name], Male    DOB: Nov 29, 1950, 69 y.o.   MRN: [id number] Visit Date: 03/25/2019  Today's Provider: Wilhemena Durie, MD   Chief Complaint  Patient presents with  . Annual Exam   Subjective:     Annual physical exam Benjamin [name] is a 69 y.o. male who presents today for health maintenance and complete physical. He feels well. He reports exercising yes. He reports he is sleeping fairly well.  ----------------------------------------------------------  Colonoscopy: 11/08/2015   Review of Systems  Constitutional: Negative.   HENT: Negative.   Eyes: Negative.   Respiratory: Negative.   Cardiovascular: Negative.   Gastrointestinal: Negative.   Endocrine: Negative.   Genitourinary:       ED.  Musculoskeletal: Positive for arthralgias and back pain.  Allergic/Immunologic: Negative.   Neurological: Negative.   Hematological: Negative.   Psychiatric/Behavioral: Negative.     Social History      He  reports that he quit smoking about 48 years ago. His smoking use included cigarettes. He has never used smokeless tobacco. He reports current alcohol use of about 1.0 - 2.0 standard drinks of alcohol per week. He reports that he does not use drugs.       Social History   Socioeconomic History  . Marital status: Married    Spouse name: Baldo Ash  . Number of children: 2  . Years of education: 16  . Highest education level: Bachelor's degree (e.g., BA, AB, BS)  Occupational History  . 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 Smoking status: Former Smoker    Types: Cigarettes    Quit date: 01/23/1971    Years since quitting: 48.2  . Smokeless tobacco: Never Used  Substance and Sexual Activity  . Alcohol use: Yes    Alcohol/week: 1.0 - 2.0 standard drinks    Types: 1 - 2 Standard drinks or equivalent per week    Comment: only on weekends  . Drug use: No  . Sexual activity: Yes  Other Topics Concern  . Not on file   Social History Narrative    SOCIAL HISTORY:  He lives in [place] with his wife.  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  He exercises as stated above.  No tobacco or illicit substance use.  He does enjoy occasional beer and vodka, mixed drink on the weekends.  He tries to follow a low-cholesterol diet.          FAMILY HISTORY:  Mother is alive, she has a pacemaker secondary to heart block, she also has a stable abdominal aneurysm.  Father's health, interesting that he had an MI at age 70 and then apparently died in his sleep at age 75 that was felt to be secondary to MI.  He has 3 siblings who are alive and well.    Social Determinants of Health   Financial Resource Strain:   . Difficulty of Paying Living Expenses: Not on file  Food Insecurity:   . Worried About Charity fundraiser in the Last Year: Not on file  . Ran Out of Food in the Last Year: Not on file  Transportation Needs:   . Lack of Transportation (Medical): Not on file  . Lack of Transportation (Non-Medical): Not on file  Physical Activity:   . Days of Exercise per Week: Not on file  . Minutes of Exercise per Session: Not on file  Stress:   . Feeling of Stress : Not on file  Social Connections:   . Frequency of Communication with Friends and Family: Not on file  . Frequency of Social Gatherings with Friends and Family: Not on file  . Attends Religious Services: Not on file  . Active Member of Clubs or Organizations: Not on file  . Attends Archivist Meetings: Not on file  . Marital Status: Not on file    Past Medical History:  Diagnosis Date  . Atrial fibrillation (HCC)    paroxysmal  . Cancer ([place])    SCC removed from neck  . Cough syncope   . Diabetes mellitus   . Dyslipidemia   . Gout   . Hyperlipidemia   . Hypertension   . Hypopotassemia      Patient Active Problem List   Diagnosis Date Noted  . Secondary hypercoagulable state ([place]) 03/03/2019  .  Hypertension   . Hyperlipidemia   . Dyslipidemia   . Cough syncope   . Allergic rhinitis 07/04/2014  . Arthritis 07/04/2014  . Back pain, chronic 07/04/2014  . Abnormal glucose tolerance test 07/04/2014  . HLD (hyperlipidemia) 07/04/2014  . Raynaud's syndrome 07/04/2014  . Diabetes mellitus, type 2 ([place]) 07/04/2014  . HYPERTENSION, BENIGN 02/20/2009  . UNSPECIFIED ACUTE PERICARDITIS 01/23/2009  . ATRIAL FIBRILLATION 01/23/2009  . COUGH SYNCOPE 01/23/2009  . DYSLIPIDEMIA 01/20/2009  . Gout 01/20/2009    Past Surgical History:  Procedure Laterality Date  . COLONOSCOPY WITH PROPOFOL N/A 11/07/2016   Procedure: COLONOSCOPY WITH PROPOFOL;  Surgeon: Lollie Sails, MD;  Location: [facility] ENDOSCOPY;  Service: Endoscopy;  Laterality: N/A;  . TONSILLECTOMY AND ADENOIDECTOMY      Family History        Family Status  Relation Name Status  . Mother  Alive  . Father  Deceased at age 57       Mi  . Sister  Alive  . Brother  Alive  . Sister  Alive  . Son  Alive  . Son  Alive        His family history includes Aneurysm in his mother; Atrial fibrillation in his mother; CAD in his father; Congestive Heart Failure in his mother; Diabetes in his father; Gout in his father and son; Hearing loss in his son; Heart attack in his father; Hypertension in his father; Other in his son and son; Ovarian cancer in his mother; Ulcers in his father.      No Known Allergies   Current Outpatient Medications:  .  allopurinol (ZYLOPRIM) 300 MG tablet, TAKE 2 TABLETS BY MOUTH DAILY, Disp: 180 tablet, Rfl: 3 .  Cholecalciferol (VITAMIN D3) 2000 UNITS TABS, Take 1 tablet by mouth daily., Disp: , Rfl:  .  CINNAMON PO, Take 2,000 mg by mouth daily., Disp: , Rfl:  .  losartan (COZAAR) 25 MG tablet, TAKE 1 TABLET BY MOUTH DAILY, Disp: 90 tablet, Rfl: 0 .  metFORMIN (GLUCOPHAGE-XR) 500 MG 24 hr tablet, TAKE TWO TABLETS BY MOUTH TWICE DAILY, Disp: 120 tablet, Rfl: 11 .  metoprolol tartrate (LOPRESSOR) 50 MG  tablet, Take one-half tablet (25 mg) by mouth twice daily as needed for palpitations and/or fast heart rates, Disp: 60 tablet, Rfl: 3 .  Omega-3 Fatty Acids (FISH OIL) 1000 MG CAPS, Take 1 capsule by mouth daily., Disp: , Rfl:  .  ONETOUCH VERIO test strip, CHECK BLOOD SUGAR DAILY, Disp: 50 each, Rfl: 11 .  rosuvastatin (CRESTOR) 10 MG tablet, TAKE ONE TABLET BY  MOUTH EVERY DAY, Disp: 90 tablet, Rfl: 3 .  sildenafil (REVATIO) 20 MG tablet, Take 1 tablet (20 mg total) by mouth 3 (three) times daily as needed., Disp: 50 tablet, Rfl: 3 .  TURMERIC PO, Take by mouth., Disp: , Rfl:  .  vitamin B-12 (CYANOCOBALAMIN) 1000 MCG tablet, Take 1,000 mcg by mouth daily., Disp: , Rfl:  .  XARELTO 20 MG TABS tablet, Take 1 tablet by mouth daily with supper, Disp: 90 tablet, Rfl: 1 .  zinc gluconate 50 MG tablet, Take 50 mg by mouth daily., Disp: , Rfl:    Patient Care Team: Jerrol Banana., MD as PCP - General (Family Medicine) Birder Robson, MD as Referring Physician (Ophthalmology) Thompson Grayer, MD as Consulting Physician (Cardiology) Ralene Bathe, MD as Consulting Physician (Dermatology) Prudencio Pair as Physician Assistant (Emergency Medicine)    Objective:    Vitals: BP 128/73 (BP Location: Left Arm, Patient Position: Sitting, Cuff Size: Large)   Pulse 70   Temp (!) 97.1 F (36.2 C) (Other (Comment))   Resp 18   Ht 5\' 9"  (1.753 m)   Wt 172 lb (78 kg)   SpO2 97%   BMI 25.40 kg/m    Vitals:   03/25/19 1023  BP: 128/73  Pulse: 70  Resp: 18  Temp: (!) 97.1 F (36.2 C)  TempSrc: Other (Comment)  SpO2: 97%  Weight: 172 lb (78 kg)  Height: 5\' 9"  (1.753 m)     Physical Exam Vitals reviewed.  Constitutional:      Appearance: Normal appearance. He is normal weight.  HENT:     Head: Normocephalic and atraumatic.     Right Ear: Tympanic membrane, ear canal and external ear normal.     Left Ear: Tympanic membrane, ear canal and external ear normal.     Nose:  Nose normal.     Mouth/Throat:     Mouth: Mucous membranes are dry.     Pharynx: Oropharynx is clear.  Eyes:     Extraocular Movements: Extraocular movements intact.     Conjunctiva/sclera: Conjunctivae normal.     Pupils: Pupils are equal, round, and reactive to light.  Cardiovascular:     Rate and Rhythm: Normal rate and regular rhythm.     Pulses: Normal pulses.     Heart sounds: Normal heart sounds.  Pulmonary:     Effort: Pulmonary effort is normal.     Breath sounds: Normal breath sounds.  Abdominal:     General: Abdomen is flat. Bowel sounds are normal.     Palpations: Abdomen is soft.  Genitourinary:    Penis: Normal.      Testes: Normal.     Prostate: Normal.     Rectum: Normal.  Musculoskeletal:        General: Normal range of motion.     Cervical back: Normal range of motion and neck supple.  Skin:    General: Skin is warm and dry.  Neurological:     General: No focal deficit present.     Mental Status: He is alert and oriented to person, place, and time. Mental status is at baseline.  Psychiatric:        Mood and Affect: Mood normal.        Behavior: Behavior normal.        Thought Content: Thought content normal.        Judgment: Judgment normal.      Depression Screen PHQ 2/9 Scores 03/25/2019 03/19/2018 02/13/2017 02/13/2017  PHQ - 2 Score 0 0 0 0  PHQ- 9 Score 0 - 2 -       Assessment & Plan:     Routine Health Maintenance and Physical Exam  Exercise Activities and Dietary recommendations Goals    . Increase Exercise     Recommend to start back at the gym for 3 days a week for 45 minutes. (previous regimen)       Immunization History  Administered Date(s) Administered  . Fluad Quad(high Dose 65+) 11/03/2018  . Influenza, High Dose Seasonal PF 10/19/2015, 10/16/2017  . Influenza-Unspecified 11/12/2014  . Pneumococcal Conjugate-13 09/13/2014  . Pneumococcal Polysaccharide-23 02/06/2000, 06/07/2009  . Td 11/10/2003  . Tdap 10/10/2010  .  Zoster 11/14/2010    Health Maintenance  Topic Date Due  . FOOT EXAM  02/13/2018  . HEMOGLOBIN A1C  03/20/2019  . PNA vac Low Risk Adult (2 of 2 - PPSV23) 09/13/2019 (Originally 09/13/2015)  . OPHTHALMOLOGY EXAM  03/04/2020  . TETANUS/TDAP  10/09/2020  . COLONOSCOPY  11/08/2026  . INFLUENZA VACCINE  Completed  . Hepatitis C Screening  Completed     Discussed health benefits of physical activity, and encouraged him to engage in regular exercise appropriate for his age and condition.    --------------------------------------------------------------------  1. Annual physical exam  - TSH - Lipid panel - Comprehensive Metabolic Panel (CMET) - CBC w/Diff/Platelet - Hemoglobin A1C - PSA - POCT urinalysis dipstick-Normal - POCT UA - Microalbumin-Normal  2. Essential hypertension, benign  - TSH - Lipid panel - Comprehensive Metabolic Panel (CMET) - CBC w/Diff/Platelet - Hemoglobin A1C - PSA   3. Hyperlipidemia, unspecified hyperlipidemia type  - TSH - Lipid panel - Comprehensive Metabolic Panel (CMET) - CBC w/Diff/Platelet - Hemoglobin A1C - PSA   4. Prostate cancer screening  - TSH - Lipid panel - Comprehensive Metabolic Panel (CMET) - CBC w/Diff/Platelet - Hemoglobin A1C - PSA - POCT urinalysis dipstick  5. Type 2 diabetes mellitus without complication, without long-term current use of insulin (HCC)  - POCT UA - Microalbumin-Normal 6.Afib  Follow up in 6 months.    I,Roberto Hlavaty,acting as a scribe for Wilhemena Durie, MD.,have documented all relevant documentation on the behalf of Wilhemena Durie, MD,as directed by  Wilhemena Durie, MD while in the presence of Wilhemena Durie, MD.    Wilhemena Durie, MD  [facility]

## 2019-03-25 ENCOUNTER — Encounter: Payer: Self-pay | Admitting: Family Medicine

## 2019-03-25 ENCOUNTER — Other Ambulatory Visit: Payer: Self-pay

## 2019-03-25 ENCOUNTER — Ambulatory Visit (INDEPENDENT_AMBULATORY_CARE_PROVIDER_SITE_OTHER): Payer: PPO | Admitting: Family Medicine

## 2019-03-25 VITALS — BP 128/73 | HR 70 | Temp 97.1°F | Resp 18 | Ht 69.0 in | Wt 172.0 lb

## 2019-03-25 DIAGNOSIS — I10 Essential (primary) hypertension: Secondary | ICD-10-CM | POA: Diagnosis not present

## 2019-03-25 DIAGNOSIS — E785 Hyperlipidemia, unspecified: Secondary | ICD-10-CM

## 2019-03-25 DIAGNOSIS — I482 Chronic atrial fibrillation, unspecified: Secondary | ICD-10-CM

## 2019-03-25 DIAGNOSIS — Z125 Encounter for screening for malignant neoplasm of prostate: Secondary | ICD-10-CM

## 2019-03-25 DIAGNOSIS — Z0000 Encounter for general adult medical examination without abnormal findings: Secondary | ICD-10-CM | POA: Diagnosis not present

## 2019-03-25 DIAGNOSIS — E119 Type 2 diabetes mellitus without complications: Secondary | ICD-10-CM | POA: Diagnosis not present

## 2019-03-25 DIAGNOSIS — N529 Male erectile dysfunction, unspecified: Secondary | ICD-10-CM

## 2019-03-25 LAB — POCT URINALYSIS DIPSTICK
Appearance: NORMAL
Bilirubin, UA: NEGATIVE
Blood, UA: NEGATIVE
Glucose, UA: NEGATIVE
Ketones, UA: NEGATIVE
Leukocytes, UA: NEGATIVE
Nitrite, UA: NEGATIVE
Odor: NORMAL
Protein, UA: NEGATIVE
Spec Grav, UA: 1.01 (ref 1.010–1.025)
Urobilinogen, UA: 0.2 E.U./dL
pH, UA: 6 (ref 5.0–8.0)

## 2019-03-25 LAB — POCT UA - MICROALBUMIN: Microalbumin Ur, POC: 0 mg/L

## 2019-03-31 DIAGNOSIS — I10 Essential (primary) hypertension: Secondary | ICD-10-CM | POA: Diagnosis not present

## 2019-03-31 DIAGNOSIS — E785 Hyperlipidemia, unspecified: Secondary | ICD-10-CM | POA: Diagnosis not present

## 2019-03-31 DIAGNOSIS — Z0000 Encounter for general adult medical examination without abnormal findings: Secondary | ICD-10-CM | POA: Diagnosis not present

## 2019-03-31 DIAGNOSIS — Z125 Encounter for screening for malignant neoplasm of prostate: Secondary | ICD-10-CM | POA: Diagnosis not present

## 2019-04-01 LAB — TSH: TSH: 1.63 u[IU]/mL (ref 0.450–4.500)

## 2019-04-01 LAB — HEMOGLOBIN A1C
Est. average glucose Bld gHb Est-mCnc: 151 mg/dL
Hgb A1c MFr Bld: 6.9 % — ABNORMAL HIGH (ref 4.8–5.6)

## 2019-04-01 LAB — COMPREHENSIVE METABOLIC PANEL
ALT: 25 IU/L (ref 0–44)
AST: 27 IU/L (ref 0–40)
Albumin/Globulin Ratio: 1.8 (ref 1.2–2.2)
Albumin: 4.2 g/dL (ref 3.8–4.8)
Alkaline Phosphatase: 63 IU/L (ref 39–117)
BUN/Creatinine Ratio: 27 — ABNORMAL HIGH (ref 10–24)
BUN: 27 mg/dL (ref 8–27)
Bilirubin Total: 0.4 mg/dL (ref 0.0–1.2)
CO2: 22 mmol/L (ref 20–29)
Calcium: 9.3 mg/dL (ref 8.6–10.2)
Chloride: 102 mmol/L (ref 96–106)
Creatinine, Ser: 0.99 mg/dL (ref 0.76–1.27)
GFR calc Af Amer: 89 mL/min/{1.73_m2} (ref >59)
GFR calc non Af Amer: 77 mL/min/{1.73_m2} (ref >59)
Globulin, Total: 2.4 g/dL (ref 1.5–4.5)
Glucose: 182 mg/dL — ABNORMAL HIGH (ref 65–99)
Potassium: 4.3 mmol/L (ref 3.5–5.2)
Sodium: 136 mmol/L (ref 134–144)
Total Protein: 6.6 g/dL (ref 6.0–8.5)

## 2019-04-01 LAB — CBC WITH DIFFERENTIAL/PLATELET
Basophils Absolute: 0 10*3/uL (ref 0.0–0.2)
Basos: 1 %
EOS (ABSOLUTE): 0.2 10*3/uL (ref 0.0–0.4)
Eos: 3 %
Hematocrit: 41 % (ref 37.5–51.0)
Hemoglobin: 13.9 g/dL (ref 13.0–17.7)
Immature Grans (Abs): 0 10*3/uL (ref 0.0–0.1)
Immature Granulocytes: 0 %
Lymphocytes Absolute: 2 10*3/uL (ref 0.7–3.1)
Lymphs: 29 %
MCH: 29.2 pg (ref 26.6–33.0)
MCHC: 33.9 g/dL (ref 31.5–35.7)
MCV: 86 fL (ref 79–97)
Monocytes Absolute: 0.7 10*3/uL (ref 0.1–0.9)
Monocytes: 10 %
Neutrophils Absolute: 4 10*3/uL (ref 1.4–7.0)
Neutrophils: 57 %
Platelets: 255 10*3/uL (ref 150–450)
RBC: 4.76 x10E6/uL (ref 4.14–5.80)
RDW: 13.1 % (ref 11.6–15.4)
WBC: 7 10*3/uL (ref 3.4–10.8)

## 2019-04-01 LAB — PSA: Prostate Specific Ag, Serum: 0.6 ng/mL (ref 0.0–4.0)

## 2019-04-05 ENCOUNTER — Other Ambulatory Visit: Payer: Self-pay | Admitting: Family Medicine

## 2019-04-05 DIAGNOSIS — E1121 Type 2 diabetes mellitus with diabetic nephropathy: Secondary | ICD-10-CM

## 2019-04-05 DIAGNOSIS — N529 Male erectile dysfunction, unspecified: Secondary | ICD-10-CM

## 2019-04-05 NOTE — Telephone Encounter (Signed)
Requested Prescriptions  Pending Prescriptions Disp Refills  . losartan (COZAAR) 25 MG tablet [Pharmacy Med Name: LOSARTAN POTASSIUM 25 MG TAB] 90 tablet 0    Sig: TAKE 1 TABLET BY MOUTH DAILY     Cardiovascular:  Angiotensin Receptor Blockers Failed - 04/05/2019 10:21 AM      Failed - Valid encounter within last 6 months    Recent Outpatient Visits          1 week ago Annual physical exam   [facility] Jerrol Banana., MD   6 months ago Type 2 diabetes mellitus without complication, without long-term current use of insulin [facility])   [facility] Jerrol Banana., MD   1 year ago Annual physical exam   [facility] Jerrol Banana., MD   1 year ago Type 2 diabetes mellitus with complication, without long-term current use of insulin [facility])   [facility] Jerrol Banana., MD   1 year ago Type 2 diabetes mellitus with complication, without long-term current use of insulin [facility])   [facility] Jerrol Banana., MD      Future Appointments            In 5 months Jerrol Banana., MD [facility]           Passed - Cr in normal range and within 180 days    Creatinine, Ser  Date Value Ref Range Status  03/31/2019 0.99 0.76 - 1.27 mg/dL Final         Passed - K in normal range and within 180 days    Potassium  Date Value Ref Range Status  03/31/2019 4.3 3.5 - 5.2 mmol/L Final         Passed - Patient is not pregnant      Passed - Last BP in normal range    BP Readings from Last 1 Encounters:  03/25/19 128/73

## 2019-05-06 DIAGNOSIS — M79645 Pain in left finger(s): Secondary | ICD-10-CM | POA: Diagnosis not present

## 2019-05-06 DIAGNOSIS — M1A09X0 Idiopathic chronic gout, multiple sites, without tophus (tophi): Secondary | ICD-10-CM | POA: Diagnosis not present

## 2019-05-06 DIAGNOSIS — E663 Overweight: Secondary | ICD-10-CM | POA: Diagnosis not present

## 2019-05-06 DIAGNOSIS — M150 Primary generalized (osteo)arthritis: Secondary | ICD-10-CM | POA: Diagnosis not present

## 2019-05-06 DIAGNOSIS — Z6825 Body mass index (BMI) 25.0-25.9, adult: Secondary | ICD-10-CM | POA: Diagnosis not present

## 2019-05-06 DIAGNOSIS — M25511 Pain in right shoulder: Secondary | ICD-10-CM | POA: Diagnosis not present

## 2019-05-06 DIAGNOSIS — M79644 Pain in right finger(s): Secondary | ICD-10-CM | POA: Diagnosis not present

## 2019-05-06 LAB — BASIC METABOLIC PANEL
BUN: 24 — AB (ref 4–21)
CO2: 23 — AB (ref 13–22)
Chloride: 102 (ref 99–108)
Creatinine: 1.1 (ref 0.6–1.3)
Glucose: 169
Potassium: 4.6 (ref 3.4–5.3)
Sodium: 138 (ref 137–147)

## 2019-05-06 LAB — COMPREHENSIVE METABOLIC PANEL
Albumin/Globulin Ratio: 1.7
Albumin: 4.4 (ref 3.5–5.0)
BUN/Creatinine Ratio: 22
Calcium: 9.8 (ref 8.7–10.7)
GFR calc Af Amer: 81
GFR calc non Af Amer: 70
Globulin: 2.6
Total Protein: 7 g/dL

## 2019-05-06 LAB — HEPATIC FUNCTION PANEL
ALT: 32 (ref 10–40)
AST: 25 (ref 14–40)
Alkaline Phosphatase: 71 (ref 25–125)
Bilirubin, Total: 0.4

## 2019-05-12 ENCOUNTER — Other Ambulatory Visit: Payer: Self-pay | Admitting: Family Medicine

## 2019-05-12 ENCOUNTER — Encounter: Payer: Self-pay | Admitting: *Deleted

## 2019-05-12 DIAGNOSIS — E118 Type 2 diabetes mellitus with unspecified complications: Secondary | ICD-10-CM

## 2019-05-12 DIAGNOSIS — I10 Essential (primary) hypertension: Secondary | ICD-10-CM

## 2019-05-12 NOTE — Telephone Encounter (Signed)
Requested medication (s) are due for refill today: Yes  Requested medication (s) are on the active medication list: Yes  Last refill:  Crestor 02/17/18  Glucophage 05/12/18  Future visit scheduled: Yes  Notes to clinic:  Prescriptions have expired.    Requested Prescriptions  Pending Prescriptions Disp Refills   rosuvastatin (CRESTOR) 10 MG tablet [Pharmacy Med Name: ROSUVASTATIN CALCIUM 10 MG TAB] 90 tablet 3    Sig: TAKE 1 TABLET BY MOUTH DAILY      Cardiovascular:  Antilipid - Statins Failed - 05/12/2019  9:11 AM      Failed - Total Cholesterol in normal range and within 360 days    Cholesterol, Total  Date Value Ref Range Status  03/20/2018 168 100 - 199 mg/dL Final          Failed - LDL in normal range and within 360 days    LDL Calculated  Date Value Ref Range Status  03/20/2018 94 0 - 99 mg/dL Final          Failed - HDL in normal range and within 360 days    HDL  Date Value Ref Range Status  03/20/2018 46 >39 mg/dL Final          Failed - Triglycerides in normal range and within 360 days    Triglycerides  Date Value Ref Range Status  03/20/2018 141 0 - 149 mg/dL Final          Passed - Patient is not pregnant      Passed - Valid encounter within last 12 months    Recent Outpatient Visits           1 month ago Annual physical exam   [facility] Jerrol Banana., MD   7 months ago Type 2 diabetes mellitus without complication, without long-term current use of insulin [facility])   [facility] Jerrol Banana., MD   1 year ago Annual physical exam   [facility] Jerrol Banana., MD   1 year ago Type 2 diabetes mellitus with complication, without long-term current use of insulin [facility])   [facility] Jerrol Banana., MD   1 year ago Type 2 diabetes mellitus with complication, without long-term current use of insulin [facility])   [facility] Jerrol Banana., MD        Future Appointments             In 4 months Jerrol Banana., MD [facility]              metFORMIN (GLUCOPHAGE-XR) 500 MG 24 hr tablet [Pharmacy Med Name: METFORMIN HCL ER 500 MG TAB] 120 tablet 11    Sig: TAKE TWO TABLETS TWICE DAILY      Endocrinology:  Diabetes - Biguanides Failed - 05/12/2019  9:11 AM      Failed - Valid encounter within last 6 months    Recent Outpatient Visits           1 month ago Annual physical exam   [facility] Jerrol Banana., MD   7 months ago Type 2 diabetes mellitus without complication, without long-term current use of insulin [facility])   [facility] Jerrol Banana., MD   1 year ago Annual physical exam   [facility] Jerrol Banana., MD   1 year ago Type 2 diabetes mellitus with complication, without long-term current use of  insulin [facility])   [facility] Jerrol Banana., MD   1 year ago Type 2 diabetes mellitus with complication, without long-term current use of insulin [facility])   [facility] Jerrol Banana., MD       Future Appointments             In 4 months Jerrol Banana., MD [facility]            Passed - Cr in normal range and within 360 days    Creatinine, Ser  Date Value Ref Range Status  03/31/2019 0.99 0.76 - 1.27 mg/dL Final          Passed - HBA1C is between 0 and 7.9 and within 180 days    Hgb A1c MFr Bld  Date Value Ref Range Status  03/31/2019 6.9 (H) 4.8 - 5.6 % Final    Comment:             Prediabetes: 5.7 - 6.4          Diabetes: >6.4          Glycemic control for adults with diabetes: <7.0           Passed - eGFR in normal range and within 360 days    GFR calc Af Amer  Date Value Ref Range Status  03/31/2019 89 >59 mL/min/1.73 Final   GFR calc non Af Amer  Date Value Ref Range Status  03/31/2019 77 >59 mL/min/1.73 Final

## 2019-06-03 ENCOUNTER — Ambulatory Visit (INDEPENDENT_AMBULATORY_CARE_PROVIDER_SITE_OTHER): Payer: PPO

## 2019-06-03 ENCOUNTER — Other Ambulatory Visit: Payer: Self-pay

## 2019-06-03 DIAGNOSIS — Z0000 Encounter for general adult medical examination without abnormal findings: Secondary | ICD-10-CM | POA: Diagnosis not present

## 2019-06-03 NOTE — Patient Instructions (Signed)
Mr. Benjamin [name] , Thank you for taking time to come for your Medicare Wellness Visit. I appreciate your ongoing commitment to your health goals. Please review the following plan we discussed and let me know if I can assist you in the future.   Screening recommendations/referrals: Colonoscopy: Up to date, due 10/2026 Recommended yearly ophthalmology/optometry visit for glaucoma screening and checkup Recommended yearly dental visit for hygiene and checkup  Vaccinations: Influenza vaccine: Up to date Pneumococcal vaccine: Completed series Tdap vaccine: Up to date Shingles vaccine: Pt declines today.     Advanced directives: Please bring a copy of your POA (Power of Attorney) and/or Living Will to your next appointment.   Conditions/risks identified: None  Next appointment: 09/22/19 @ 8:00 AM with Dr Rosanna Randy   Preventive Care 70 Years and Older, Male Preventive care refers to lifestyle choices and visits with your health care provider that can promote health and wellness. What does preventive care include?  A yearly physical exam. This is also called an annual well check.  Dental exams once or twice a year.  Routine eye exams. Ask your health care provider how often you should have your eyes checked.  Personal lifestyle choices, including:  Daily care of your teeth and gums.  Regular physical activity.  Eating a healthy diet.  Avoiding tobacco and drug use.  Limiting alcohol use.  Practicing safe sex.  Taking low doses of aspirin every day.  Taking vitamin and mineral supplements as recommended by your health care provider. What happens during an annual well check? The services and screenings done by your health care provider during your annual well check will depend on your age, overall health, lifestyle risk factors, and family history of disease. Counseling  Your health care provider may ask you questions about your:  Alcohol use.  Tobacco use.  Drug use.  Emotional  well-being.  Home and relationship well-being.  Sexual activity.  Eating habits.  History of falls.  Memory and ability to understand (cognition).  Work and work Statistician. Screening  You may have the following tests or measurements:  Height, weight, and BMI.  Blood pressure.  Lipid and cholesterol levels. These may be checked every 5 years, or more frequently if you are over 30 years old.  Skin check.  Lung cancer screening. You may have this screening every year starting at age 19 if you have a 30-pack-year history of smoking and currently smoke or have quit within the past 15 years.  Fecal occult blood test (FOBT) of the stool. You may have this test every year starting at age 70.  Flexible sigmoidoscopy or colonoscopy. You may have a sigmoidoscopy every 5 years or a colonoscopy every 10 years starting at age 70.  Prostate cancer screening. Recommendations will vary depending on your family history and other risks.  Hepatitis C blood test.  Hepatitis B blood test.  Sexually transmitted disease (STD) testing.  Diabetes screening. This is done by checking your blood sugar (glucose) after you have not eaten for a while (fasting). You may have this done every 1-3 years.  Abdominal aortic aneurysm (AAA) screening. You may need this if you are a current or former smoker.  Osteoporosis. You may be screened starting at age 70 if you are at high risk. Talk with your health care provider about your test results, treatment options, and if necessary, the need for more tests. Vaccines  Your health care provider may recommend certain vaccines, such as:  Influenza vaccine. This is recommended every year.  Tetanus, diphtheria, and acellular pertussis (Tdap, Td) vaccine. You may need a Td booster every 10 years.  Zoster vaccine. You may need this after age 70.  Pneumococcal 13-valent conjugate (PCV13) vaccine. One dose is recommended after age 26.  Pneumococcal  polysaccharide (PPSV23) vaccine. One dose is recommended after age 70. Talk to your health care provider about which screenings and vaccines you need and how often you need them. This information is not intended to replace advice given to you by your health care provider. Make sure you discuss any questions you have with your health care provider. Document Released: 02/24/2015 Document Revised: 10/18/2015 Document Reviewed: 11/29/2014 Elsevier Interactive Patient Education  2017 [place] Prevention in the Home Falls can cause injuries. They can happen to people of all ages. There are many things you can do to make your home safe and to help prevent falls. What can I do on the outside of my home?  Regularly fix the edges of walkways and driveways and fix any cracks.  Remove anything that might make you trip as you walk through a door, such as a raised step or threshold.  Trim any bushes or trees on the path to your home.  Use bright outdoor lighting.  Clear any walking paths of anything that might make someone trip, such as rocks or tools.  Regularly check to see if handrails are loose or broken. Make sure that both sides of any steps have handrails.  Any raised decks and porches should have guardrails on the edges.  Have any leaves, snow, or ice cleared regularly.  Use sand or salt on walking paths during winter.  Clean up any spills in your garage right away. This includes oil or grease spills. What can I do in the bathroom?  Use night lights.  Install grab bars by the toilet and in the tub and shower. Do not use towel bars as grab bars.  Use non-skid mats or decals in the tub or shower.  If you need to sit down in the shower, use a plastic, non-slip stool.  Keep the floor dry. Clean up any water that spills on the floor as soon as it happens.  Remove soap buildup in the tub or shower regularly.  Attach bath mats securely with double-sided non-slip rug  tape.  Do not have throw rugs and other things on the floor that can make you trip. What can I do in the bedroom?  Use night lights.  Make sure that you have a light by your bed that is easy to reach.  Do not use any sheets or blankets that are too big for your bed. They should not hang down onto the floor.  Have a firm chair that has side arms. You can use this for support while you get dressed.  Do not have throw rugs and other things on the floor that can make you trip. What can I do in the kitchen?  Clean up any spills right away.  Avoid walking on wet floors.  Keep items that you use a lot in easy-to-reach places.  If you need to reach something above you, use a strong step stool that has a grab bar.  Keep electrical cords out of the way.  Do not use floor polish or wax that makes floors slippery. If you must use wax, use non-skid floor wax.  Do not have throw rugs and other things on the floor that can make you trip. What can I do  with my stairs?  Do not leave any items on the stairs.  Make sure that there are handrails on both sides of the stairs and use them. Fix handrails that are broken or loose. Make sure that handrails are as long as the stairways.  Check any carpeting to make sure that it is firmly attached to the stairs. Fix any carpet that is loose or worn.  Avoid having throw rugs at the top or bottom of the stairs. If you do have throw rugs, attach them to the floor with carpet tape.  Make sure that you have a light switch at the top of the stairs and the bottom of the stairs. If you do not have them, ask someone to add them for you. What else can I do to help prevent falls?  Wear shoes that:  Do not have high heels.  Have rubber bottoms.  Are comfortable and fit you well.  Are closed at the toe. Do not wear sandals.  If you use a stepladder:  Make sure that it is fully opened. Do not climb a closed stepladder.  Make sure that both sides of the  stepladder are locked into place.  Ask someone to hold it for you, if possible.  Clearly mark and make sure that you can see:  Any grab bars or handrails.  First and last steps.  Where the edge of each step is.  Use tools that help you move around (mobility aids) if they are needed. These include:  Canes.  Walkers.  Scooters.  Crutches.  Turn on the lights when you go into a dark area. Replace any light bulbs as soon as they burn out.  Set up your furniture so you have a clear path. Avoid moving your furniture around.  If any of your floors are uneven, fix them.  If there are any pets around you, be aware of where they are.  Review your medicines with your doctor. Some medicines can make you feel dizzy. This can increase your chance of falling. Ask your doctor what other things that you can do to help prevent falls. This information is not intended to replace advice given to you by your health care provider. Make sure you discuss any questions you have with your health care provider. Document Released: 11/24/2008 Document Revised: 07/06/2015 Document Reviewed: 03/04/2014 Elsevier Interactive Patient Education  2017 [employer].

## 2019-06-03 NOTE — Progress Notes (Signed)
Subjective:   Benjamin [name] is a 69 y.o. male who presents for Medicare Annual/Subsequent preventive examination.    This visit is being conducted through telemedicine due to the COVID-19 pandemic. This patient has given me verbal consent via doximity to conduct this visit, patient states they are participating from their home address. Some vital signs may be absent or patient reported.    Patient identification: identified by name, DOB, and current address  Review of Systems:  N/A  Cardiac Risk Factors include: advanced age (>54men, >60 women);diabetes mellitus;dyslipidemia;male gender;hypertension     Objective:    Vitals: There were no vitals taken for this visit.  There is no height or weight on file to calculate BMI. Unable to obtain vitals due to visit being conducted via telephonically.   Advanced Directives 06/03/2019 02/13/2017 11/07/2016 01/30/2015 01/30/2015  Does Patient Have a Medical Advance Directive? Yes Yes Yes Yes No  Type of Paramedic of [place];Living will [place];Living will Living will [place];Living will -  Copy of Excel in Chart? No - copy requested No - copy requested - - -    Tobacco Social History   Tobacco Use  Smoking Status Former Smoker  . Types: Cigarettes  . Quit date: 01/23/1971  . Years since quitting: 48.3  Smokeless Tobacco Never Used     Counseling given: Not Answered   Clinical Intake:  Pre-visit preparation completed: Yes  Pain : No/denies pain Pain Score: 0-No pain     Nutritional Risks: None Diabetes: Yes  How often do you need to have someone help you when you read instructions, pamphlets, or other written materials from your doctor or pharmacy?: 1 - Never   Diabetes:  Is the patient diabetic?  Yes  If diabetic, was a CBG obtained today?  No  Did the patient bring in their glucometer from home?  No  How often do you monitor  your CBG's? Once a day in the morning.   Financial Strains and Diabetes Management:  Are you having any financial strains with the device, your supplies or your medication? No .  Does the patient want to be seen by Chronic Care Management for management of their diabetes?  No  Would the patient like to be referred to a Nutritionist or for Diabetic Management?  No   Diabetic Exams:  Diabetic Eye Exam: Completed 03/05/19. Repeat yearly.  Diabetic Foot Exam: Completed 02/13/17. Pt has been advised about the importance in completing this exam. Note made to follow up on this at next in office apt.   Interpreter Needed?: No  Information entered by :: [facility], LPN  Past Medical History:  Diagnosis Date  . Atrial fibrillation (HCC)    paroxysmal  . Cancer ([place])    SCC removed from neck  . Cough syncope   . Diabetes mellitus   . Dyslipidemia   . Gout   . Hyperlipidemia   . Hypertension   . Hypopotassemia    Past Surgical History:  Procedure Laterality Date  . COLONOSCOPY WITH PROPOFOL N/A 11/07/2016   Procedure: COLONOSCOPY WITH PROPOFOL;  Surgeon: Lollie Sails, MD;  [facility] END[facility]COPY;  Service: Endoscopy;  Laterality: N/A;  . TONSILLECTOMY AND ADENOIDECTOMY     Family History  Problem Relation Age of Onset  . Atrial fibrillation Mother   . Ovarian cancer Mother   . Congestive Heart Failure Mother   . Aneurysm Mother   . Heart attack Father   .  Diabetes Father   . Hypertension Father   . Ulcers Father   . Gout Father   . CAD Father   . Other Son        ITP  . Gout Son   . Other Son        ITP  . Hearing loss Son    Social History   Socioeconomic History  . Marital status: Married    Spouse name: Baldo Ash  . Number of children: 2  . Years of education: 16  . Highest education level: Bachelor's degree (e.g., BA, AB, BS)  Occupational History  . 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 Smoking  status: Former Smoker    Types: Cigarettes    Quit date: 01/23/1971    Years since quitting: 48.3  . Smokeless tobacco: Never Used  Substance and Sexual Activity  . Alcohol use: Yes    Alcohol/week: 4.0 standard drinks    Types: 4 Glasses of wine per week    Comment: only on weekends  . Drug use: No  . Sexual activity: Yes  Other Topics Concern  . Not on file  Social History Narrative    SOCIAL HISTORY:  He lives in [place] with his wife.  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  He has 2 adult sons.  He exercises as stated above.  No tobacco or illicit substance use.  He does enjoy occasional beer and vodka, mixed drink on the weekends.  He tries to follow a low-cholesterol diet.          FAMILY HISTORY:  Mother is alive, she has a pacemaker secondary to heart block, she also has a stable abdominal aneurysm.  Father's health, interesting that he had an MI at age 44 and then apparently died in his sleep at age 70 that was felt to be secondary to MI.  He has 3 siblings who are alive and well.    Social Determinants of Health   Financial Resource Strain: Low Risk   . Difficulty of Paying Living Expenses: Not hard at all  Food Insecurity: No Food Insecurity  . Worried About Charity fundraiser in the Last Year: Never true  . Ran Out of Food in the Last Year: Never true  Transportation Needs: No Transportation Needs  . Lack of Transportation (Medical): No  . Lack of Transportation (Non-Medical): No  Physical Activity: Sufficiently Active  . Days of Exercise per Week: 5 days  . Minutes of Exercise per Session: 40 min  Stress: No Stress Concern Present  . Feeling of Stress : Not at all  Social Connections: Slightly Isolated  . Frequency of Communication with Friends and Family: More than three times a week  . Frequency of Social Gatherings with Friends and Family: More than three times a week  . Attends Religious Services: More than 4 times per year  . Active  Member of Clubs or Organizations: No  . Attends Archivist Meetings: Never  . Marital Status: Married    Outpatient Encounter Medications as of 06/03/2019  Medication Sig  . allopurinol (ZYLOPRIM) 300 MG tablet TAKE 2 TABLETS BY MOUTH DAILY  . Cholecalciferol (VITAMIN D3) 2000 UNITS TABS Take 1 tablet by mouth daily.  Marland Kitchen CINNAMON PO Take 2,000 mg by mouth daily.  Marland Kitchen losartan (COZAAR) 25 MG tablet TAKE 1 TABLET BY MOUTH DAILY  . metFORMIN (GLUCOPHAGE-XR) 500 MG 24 hr tablet  TAKE TWO TABLETS TWICE DAILY  . Omega-3 Fatty Acids (FISH OIL) 1000 MG CAPS Take 1 capsule by mouth daily.  Glory Rosebush VERIO test strip CHECK BLOOD SUGAR DAILY  . rosuvastatin (CRESTOR) 10 MG tablet TAKE 1 TABLET BY MOUTH DAILY  . sildenafil (REVATIO) 20 MG tablet TAKE 1 TABLET 3 TIMES A DAY AS NEEDED  . TURMERIC PO Take by mouth daily. Dose unknown  . vitamin B-12 (CYANOCOBALAMIN) 1000 MCG tablet Take 1,000 mcg by mouth daily.  Alveda Reasons 20 MG TABS tablet Take 1 tablet by mouth daily with supper  . metoprolol tartrate (LOPRESSOR) 50 MG tablet Take one-half tablet (25 mg) by mouth twice daily as needed for palpitations and/or fast heart rates (Patient not taking: Reported on 06/03/2019)  . zinc gluconate 50 MG tablet Take 50 mg by mouth daily.   No facility-administered encounter medications on file as of 06/03/2019.    Activities of Daily Living In your present state of health, do you have any difficulty performing the following activities: 06/03/2019 03/25/2019  Hearing? N N  Vision? N N  Difficulty concentrating or making decisions? N N  Walking or climbing stairs? N N  Dressing or bathing? N N  Doing errands, shopping? N N  Preparing Food and eating ? N -  Using the Toilet? N -  In the past six months, have you accidently leaked urine? N -  Do you have problems with loss of bowel control? N -  Managing your Medications? N -  Managing your Finances? N -  Housekeeping or managing your Housekeeping? N -    Some recent data might be hidden    Patient Care Team: Jerrol Banana., MD as PCP - General (Family Medicine) Birder Robson, MD as Referring Physician (Ophthalmology) Thompson Grayer, MD as Consulting Physician (Cardiology) Ralene Bathe, MD as Consulting Physician (Dermatology) Prudencio Pair as Physician Assistant (Emergency Medicine)   Assessment:   This is a routine wellness examination for [place].  Exercise Activities and Dietary recommendations Current Exercise Habits: Structured exercise class, Type of exercise: strength training/weights;stretching;treadmill;walking, Time (Minutes): 45, Frequency (Times/Week): 5, Weekly Exercise (Minutes/Week): 225, Intensity: Moderate, Exercise limited by: None identified  Goals   None     Fall Risk Fall Risk  06/03/2019 03/25/2019 03/19/2018 02/13/2017 10/11/2015  Falls in the past year? 0 0 0 No No  Number falls in past yr: 0 0 - - -  Injury with Fall? 0 0 - - -  Follow up - Falls evaluation completed - - -   FALL RISK PREVENTION PERTAINING TO THE HOME:  Any stairs in or around the home? No  If so, are there any without handrails? N/A  Home free of loose throw rugs in walkways, pet beds, electrical cords, etc? Yes  Adequate lighting in your home to reduce risk of falls? Yes   ASSISTIVE DEVICES UTILIZED TO PREVENT FALLS:  Life alert? No  Use of a cane, walker or w/c? No  Grab bars in the bathroom? No  Shower chair or bench in shower? No  Elevated toilet seat or a handicapped toilet? No    TIMED UP AND GO:  Was the test performed? No .    Depression Screen PHQ 2/9 Scores 03/25/2019 03/19/2018 02/13/2017 02/13/2017  PHQ - 2 Score 0 0 0 0  PHQ- 9 Score 0 - 2 -    Cognitive Function     6CIT Screen 06/03/2019  What Year? 0 points  What month? 0 points  What time? 0 points  Count back from 20 0 points  Months in reverse 0 points  Repeat phrase 0 points  Total Score 0    Immunization History   Administered Date(s) Administered  . Fluad Quad(high Dose 65+) 11/03/2018  . Influenza, High Dose Seasonal PF 10/19/2015, 10/16/2017  . Influenza-Unspecified 11/12/2014  . Moderna SARS-COVID-2 Vaccination 03/16/2019  . Pneumococcal Conjugate-13 09/13/2014  . Pneumococcal Polysaccharide-23 02/06/2000, 06/07/2009  . Td 11/10/2003  . Tdap 10/10/2010  . Zoster 11/14/2010    Qualifies for Shingles Vaccine? Yes  Zostavax completed 11/14/10. Due for Shingrix. Pt has been advised to call insurance company to determine out of pocket expense. Advised may also receive vaccine at local pharmacy or Health Dept. Verbalized acceptance and understanding.  Tdap: Up to date  Flu Vaccine: Up to date  Pneumococcal Vaccine: Due for Pneumococcal vaccine. Does the patient want to receive this vaccine today?  No . Advised may receive this vaccine at local pharmacy or Health Dept. Aware to provide a copy of the vaccination record if obtained from local pharmacy or Health Dept. Verbalized acceptance and understanding.   Screening Tests Health Maintenance  Topic Date Due  . FOOT EXAM  02/13/2018  . COVID-19 Vaccine (2 - Moderna 2-dose series) 04/13/2019  . PNA vac Low Risk Adult (2 of 2 - PPSV23) 09/13/2019 (Originally 09/13/2015)  . INFLUENZA VACCINE  09/12/2019  . HEMOGLOBIN A1C  09/28/2019  . OPHTHALMOLOGY EXAM  03/04/2020  . TETANUS/TDAP  10/09/2020  . COLONOSCOPY  11/08/2026  . Hepatitis C Screening  Completed   Cancer Screenings:  Colorectal Screening: Completed 11/07/16. Repeat every 10 years.   Lung Cancer Screening: (Low Dose CT Chest recommended if Age 2-80 years, 30 pack-year currently smoking OR have quit w/in 15years.) does not qualify.   Additional Screening:  Hepatitis C Screening: Up to date  Dental Screening: Recommended annual dental exams for proper oral hygiene  Community Resource Referral:  CRR required this visit?  No        Plan:  I have personally reviewed and addressed  the Medicare Annual Wellness questionnaire and have noted the following in the patient's chart:  A. Medical and social history B. Use of alcohol, tobacco or illicit drugs  C. Current medications and supplements D. Functional ability and status E.  Nutritional status F.  Physical activity G. Advance directives H. List of other physicians I.  Hospitalizations, surgeries, and ER visits in previous 12 months J.  [place] such as hearing and vision if needed, cognitive and depression L. Referrals and appointments   In addition, I have reviewed and discussed with patient certain preventive protocols, quality metrics, and best practice recommendations. A written personalized care plan for preventive services as well as general preventive health recommendations were provided to patient.   Glendora Score, [place]  [id number] Nurse Health Advisor  Nurse Notes: Pt needs a diabetic foot exam at next in office apt.

## 2019-06-14 ENCOUNTER — Other Ambulatory Visit: Payer: Self-pay | Admitting: Family Medicine

## 2019-06-14 DIAGNOSIS — E118 Type 2 diabetes mellitus with unspecified complications: Secondary | ICD-10-CM

## 2019-07-05 ENCOUNTER — Other Ambulatory Visit: Payer: Self-pay | Admitting: Family Medicine

## 2019-07-05 DIAGNOSIS — E1121 Type 2 diabetes mellitus with diabetic nephropathy: Secondary | ICD-10-CM

## 2019-07-14 ENCOUNTER — Other Ambulatory Visit: Payer: Self-pay | Admitting: Pharmacist

## 2019-07-14 MED ORDER — RIVAROXABAN 20 MG PO TABS: ORAL_TABLET | ORAL | 1 refills | Status: DC

## 2019-07-14 NOTE — Progress Notes (Signed)
Prescription refill request for Xarelto received.  Indication: a fib Last office visit: Weight: 172 Age: 69 Scr: 1.1 CrCl: 80 mL/min

## 2019-09-02 ENCOUNTER — Other Ambulatory Visit: Payer: Self-pay

## 2019-09-02 ENCOUNTER — Ambulatory Visit: Payer: PPO | Admitting: Dermatology

## 2019-09-02 ENCOUNTER — Encounter: Payer: Self-pay | Admitting: Dermatology

## 2019-09-02 DIAGNOSIS — L570 Actinic keratosis: Secondary | ICD-10-CM

## 2019-09-02 DIAGNOSIS — Z1283 Encounter for screening for malignant neoplasm of skin: Secondary | ICD-10-CM | POA: Diagnosis not present

## 2019-09-02 DIAGNOSIS — D692 Other nonthrombocytopenic purpura: Secondary | ICD-10-CM

## 2019-09-02 DIAGNOSIS — L821 Other seborrheic keratosis: Secondary | ICD-10-CM | POA: Diagnosis not present

## 2019-09-02 DIAGNOSIS — L814 Other melanin hyperpigmentation: Secondary | ICD-10-CM

## 2019-09-02 DIAGNOSIS — Z85828 Personal history of other malignant neoplasm of skin: Secondary | ICD-10-CM | POA: Diagnosis not present

## 2019-09-02 DIAGNOSIS — L719 Rosacea, unspecified: Secondary | ICD-10-CM | POA: Diagnosis not present

## 2019-09-02 DIAGNOSIS — D229 Melanocytic nevi, unspecified: Secondary | ICD-10-CM | POA: Diagnosis not present

## 2019-09-02 DIAGNOSIS — D1800 Hemangioma unspecified site: Secondary | ICD-10-CM | POA: Diagnosis not present

## 2019-09-02 DIAGNOSIS — L578 Other skin changes due to chronic exposure to nonionizing radiation: Secondary | ICD-10-CM

## 2019-09-02 NOTE — Progress Notes (Signed)
° °  Follow-Up Visit   Subjective  Benjamin [name] is a 69 y.o. male who presents for the following: Annual Exam (Hx SCC). The patient presents for Total-Body Skin Exam (TBSE) for skin cancer screening and mole check.  The following portions of the chart were reviewed this encounter and updated as appropriate:  Tobacco   Allergies   Meds   Problems   Med Hx   Surg Hx   Fam Hx      Review of Systems:  No other skin or systemic complaints except as noted in HPI or Assessment and Plan.  Objective  Well appearing patient in no apparent distress; mood and affect are within normal limits.  A full examination was performed including scalp, head, eyes, ears, nose, lips, neck, chest, axillae, abdomen, back, buttocks, bilateral upper extremities, bilateral lower extremities, hands, feet, fingers, toes, fingernails, and toenails. All findings within normal limits unless otherwise noted below.  Objective  Arms: Violaceous macules and patches.   Objective  Left nose, cheek x 3 (3): Erythematous thin papules/macules with gritty scale.   Objective  Face: Mid face erythema with telangiectasias +/- scattered inflammatory papules.   Assessment & Plan    Senile purpura (HCC) Arms  Benign, observe.    AK (actinic keratosis) (3) Left nose, cheek x 3  Destruction of lesion - Left nose, cheek x 3 Complexity: simple   Destruction method: cryotherapy   Informed consent: discussed and consent obtained   Timeout:  patient name, date of birth, surgical site, and procedure verified Lesion destroyed using liquid nitrogen: Yes   Region frozen until ice ball extended beyond lesion: Yes   Outcome: patient tolerated procedure well with no complications   Post-procedure details: wound care instructions given    Rosacea Face  Discussed laser.    Lentigines - Scattered tan macules - Discussed due to sun exposure - Benign, observe - Call for any changes  Seborrheic Keratoses - Stuck-on, waxy,  tan-brown papules and plaques R lower eyelid, R cheek, trunk, extremities - Discussed benign etiology and prognosis. - Observe - Call for any changes  Melanocytic Nevi - Tan-brown and/or pink-flesh-colored symmetric macules and papules - Benign appearing on exam today - Observation - Call clinic for new or changing moles - Recommend daily use of broad spectrum spf 30+ sunscreen to sun-exposed areas.   Hemangiomas - Red papules - Discussed benign nature - Observe - Call for any changes  Actinic Damage - diffuse scaly erythematous macules with underlying dyspigmentation - Recommend daily broad spectrum sunscreen SPF 30+ to sun-exposed areas, reapply every 2 hours as needed.  - Call for new or changing lesions.  History of Squamous Cell Carcinoma of the Skin - No evidence of recurrence today - No lymphadenopathy - Recommend regular full body skin exams - Recommend daily broad spectrum sunscreen SPF 30+ to sun-exposed areas, reapply every 2 hours as needed.  - Call if any new or changing lesions are noted between office visits  Skin cancer screening performed today.  Return in about 1 year (around 09/01/2020) for TBSE.  Luther Redo, CMA, am acting as scribe for Sarina Ser, MD .  Documentation: I have reviewed the above documentation for accuracy and completeness, and I agree with the above.  Sarina Ser, MD

## 2019-09-02 NOTE — Patient Instructions (Addendum)
Recommend daily broad spectrum sunscreen SPF 30+ to sun-exposed areas, reapply every 2 hours as needed. Call for new or changing lesions.  Cryotherapy Aftercare  . Wash gently with soap and water everyday.   . Apply Vaseline and Band-Aid daily until healed.  

## 2019-09-05 ENCOUNTER — Encounter: Payer: Self-pay | Admitting: Dermatology

## 2019-09-09 ENCOUNTER — Other Ambulatory Visit: Payer: Self-pay | Admitting: Family Medicine

## 2019-09-10 ENCOUNTER — Other Ambulatory Visit: Payer: Self-pay | Admitting: Family Medicine

## 2019-09-10 DIAGNOSIS — E118 Type 2 diabetes mellitus with unspecified complications: Secondary | ICD-10-CM

## 2019-09-20 NOTE — Progress Notes (Signed)
I,Maresa Morash,acting as a scribe for Wilhemena Durie, MD.,have documented all relevant documentation on the behalf of Wilhemena Durie, MD,as directed by  Wilhemena Durie, MD while in the presence of Wilhemena Durie, MD.   Established patient visit   Patient: Benjamin [name]   DOB: December 14, 1950   69 y.o. Male  MRN: [id number] Visit Date: 09/22/2019  Today's healthcare provider: Wilhemena Durie, MD   Chief Complaint  Patient presents with  . Diabetes  . Follow-up  . Hypertension   Subjective    HPI   Patient feels well and has no complaints.  He is taking his medication as prescribed. He sees Dr. Amedeo Plenty from hand surgery next month regarding an ongoing thumb issue. Diabetes Mellitus Type II, follow-up  Lab Results  Component Value Date   HGBA1C 6.9 (H) 03/31/2019   HGBA1C 6.5 (A) 09/17/2018   HGBA1C 6.8 (H) 03/20/2018   Last seen for diabetes 6 months ago.  Management since then includes continuing the same treatment. He reports good compliance with treatment. He is not having side effects. none  Home blood sugar records: fasting range: 130-150  Episodes of hypoglycemia? No n/a   Current insulin regiment: n/a Most Recent Eye Exam: 03/05/2019  --------------------------------------------------------------------  Hypertension, follow-up  BP Readings from Last 3 Encounters:  09/22/19 108/64  03/25/19 128/73  03/03/19 130/76   Wt Readings from Last 3 Encounters:  09/22/19 172 lb (78 kg)  03/25/19 172 lb (78 kg)  03/03/19 172 lb (78 kg)     He was last seen for hypertension 6 months ago.  BP at that visit was 128/73. Management since that visit includes; on metoprolol 50. He reports good compliance with treatment. He is not having side effects. none He is exercising. He is adherent to low salt diet.   Outside blood pressures are not checking.  He does not smoke.  Use of agents associated with hypertension: none.    -------------------------------------------------------------------   Past Medical History:  Diagnosis Date  . Atrial fibrillation (HCC)    paroxysmal  . Cancer ([place])    SCC removed from neck  . Cough syncope   . Diabetes mellitus   . Dyslipidemia   . Gout   . Hyperlipidemia   . Hypertension   . Hypopotassemia   . Squamous cell carcinoma of skin 10/31/2016   right lat neck post aspect (in situ)       Medications: Outpatient Medications Prior to Visit  Medication Sig  . allopurinol (ZYLOPRIM) 300 MG tablet TAKE TWO TABLETS EVERY DAY  . Cholecalciferol (VITAMIN D3) 2000 UNITS TABS Take 1 tablet by mouth daily.  Marland Kitchen CINNAMON PO Take 2,000 mg by mouth daily.  Marland Kitchen losartan (COZAAR) 25 MG tablet TAKE 1 TABLET BY MOUTH DAILY  . metFORMIN (GLUCOPHAGE-XR) 500 MG 24 hr tablet TAKE TWO TABLETS TWICE DAILY  . metoprolol tartrate (LOPRESSOR) 50 MG tablet Take one-half tablet (25 mg) by mouth twice daily as needed for palpitations and/or fast heart rates  . Omega-3 Fatty Acids (FISH OIL) 1000 MG CAPS Take 1 capsule by mouth daily.  Glory Rosebush VERIO test strip CHECK BLOOD SUGAR DAILY  . rivaroxaban (XARELTO) 20 MG TABS tablet Take 1 tablet by mouth daily with supper  . rosuvastatin (CRESTOR) 10 MG tablet TAKE 1 TABLET BY MOUTH DAILY  . sildenafil (REVATIO) 20 MG tablet TAKE 1 TABLET 3 TIMES A DAY AS NEEDED  . TURMERIC PO Take by mouth daily. Dose unknown  . vitamin  B-12 (CYANOCOBALAMIN) 1000 MCG tablet Take 1,000 mcg by mouth daily.  Marland Kitchen zinc gluconate 50 MG tablet Take 50 mg by mouth daily.   No facility-administered medications prior to visit.    Review of Systems  Constitutional: Negative for appetite change, chills and fever.  Respiratory: Negative for chest tightness, shortness of breath and wheezing.   Cardiovascular: Negative for chest pain and palpitations.  Gastrointestinal: Negative for abdominal pain, nausea and vomiting.    Last hemoglobin A1c Lab Results  Component  Value Date   HGBA1C 7.1 (H) 09/22/2019      Objective    BP 108/64 (BP Location: Left Arm, Patient Position: Sitting, Cuff Size: Large)   Pulse 63   Temp 98.1 F (36.7 C) (Oral)   Resp 16   Ht 5\' 9"  (1.753 m)   Wt 172 lb (78 kg)   SpO2 98%   BMI 25.40 kg/m  BP Readings from Last 3 Encounters:  09/22/19 108/64  03/25/19 128/73  03/03/19 130/76   Wt Readings from Last 3 Encounters:  09/22/19 172 lb (78 kg)  03/25/19 172 lb (78 kg)  03/03/19 172 lb (78 kg)      Physical Exam  BP 108/64 (BP Location: Left Arm, Patient Position: Sitting, Cuff Size: Large)   Pulse 63   Temp 98.1 F (36.7 C) (Oral)   Resp 16   Ht 5\' 9"  (1.753 m)   Wt 172 lb (78 kg)   SpO2 98%   BMI 25.40 kg/m   General Appearance:    Alert, cooperative, no distress, appears stated age  Head:    Normocephalic, without obvious abnormality, atraumatic  Eyes:    PERRL, conjunctiva/corneas clear, EOM's intact, fundi    benign, both eyes       Ears:    Normal TM's and external ear canals, both ears  Nose:   Nares normal, septum midline, mucosa normal, no drainage   or sinus tenderness  Throat:   Lips, mucosa, and tongue normal; teeth and gums normal  Neck:   Supple, symmetrical, trachea midline, no adenopathy;       thyroid:  No enlargement/tenderness/nodules; no carotid   bruit or JVD  Back:     Symmetric, no curvature, ROM normal, no CVA tenderness  Lungs:     Clear to auscultation bilaterally, respirations unlabored  Chest wall:    No tenderness or deformity  Heart:    Regular rate and rhythm, S1 and S2 normal, no murmur, rub   or gallop  Abdomen:     Soft, non-tender, bowel sounds active all four quadrants,    no masses, no organomegaly  Genitalia:    Normal male without lesion, discharge or tenderness  Rectal:    Normal tone, normal prostate, no masses or tenderness;   guaiac negative stool  Extremities:   Extremities normal, atraumatic, no cyanosis or edema  Pulses:   2+ and symmetric all  extremities  Skin:   Skin color, texture, turgor normal, no rashes or lesions  Lymph nodes:   Cervical, supraclavicular, and axillary nodes normal  Neurologic:   CNII-XII intact. Normal strength, sensation and reflexes      throughout     No results found for any visits on 09/22/19.  Assessment & Plan     1. Type 2 diabetes mellitus without complication, without long-term current use of insulin (HCC)  - Lipid Panel With LDL/HDL Ratio - Hemoglobin A1c  2. Hyperlipidemia, unspecified hyperlipidemia type  - Lipid Panel With LDL/HDL Ratio - Hemoglobin  A1c  3. Essential hypertension, benign  - Lipid Panel With LDL/HDL Ratio - Hemoglobin A1c   Return in about 6 months (around 03/24/2020) for CPE.         Richard Cranford Mon, MD  [facility] [phone] (phone) [phone] (fax)  [place]

## 2019-09-22 ENCOUNTER — Ambulatory Visit (INDEPENDENT_AMBULATORY_CARE_PROVIDER_SITE_OTHER): Payer: PPO | Admitting: Family Medicine

## 2019-09-22 ENCOUNTER — Other Ambulatory Visit: Payer: Self-pay

## 2019-09-22 ENCOUNTER — Encounter: Payer: Self-pay | Admitting: Family Medicine

## 2019-09-22 VITALS — BP 108/64 | HR 63 | Temp 98.1°F | Resp 16 | Ht 69.0 in | Wt 172.0 lb

## 2019-09-22 DIAGNOSIS — E119 Type 2 diabetes mellitus without complications: Secondary | ICD-10-CM | POA: Diagnosis not present

## 2019-09-22 DIAGNOSIS — E785 Hyperlipidemia, unspecified: Secondary | ICD-10-CM

## 2019-09-22 DIAGNOSIS — I10 Essential (primary) hypertension: Secondary | ICD-10-CM | POA: Diagnosis not present

## 2019-09-23 LAB — LIPID PANEL WITH LDL/HDL RATIO
Cholesterol, Total: 164 mg/dL (ref 100–199)
HDL: 40 mg/dL (ref >39)
LDL Chol Calc (NIH): 88 mg/dL (ref 0–99)
LDL/HDL Ratio: 2.2 ratio (ref 0.0–3.6)
Triglycerides: 214 mg/dL — ABNORMAL HIGH (ref 0–149)
VLDL Cholesterol Cal: 36 mg/dL (ref 5–40)

## 2019-09-23 LAB — HEMOGLOBIN A1C
Est. average glucose Bld gHb Est-mCnc: 157 mg/dL
Hgb A1c MFr Bld: 7.1 % — ABNORMAL HIGH (ref 4.8–5.6)

## 2019-09-24 ENCOUNTER — Telehealth: Payer: Self-pay

## 2019-09-24 DIAGNOSIS — I10 Essential (primary) hypertension: Secondary | ICD-10-CM

## 2019-09-24 DIAGNOSIS — E785 Hyperlipidemia, unspecified: Secondary | ICD-10-CM

## 2019-09-24 MED ORDER — ROSUVASTATIN CALCIUM 20 MG PO TABS: 20.0000 mg | ORAL_TABLET | Freq: Every day | ORAL | 1 refills | Status: DC

## 2019-09-24 NOTE — Telephone Encounter (Signed)
-----   Message from Jerrol Banana., MD sent at 09/24/2019  9:34 AM EDT ----- Labs stable but will increase Crestor from 10 to 20 mg daily to try to get LDL cholesterol less than 70.  It is presently 50.

## 2019-09-24 NOTE — Telephone Encounter (Signed)
Patient advised of lab results through mychart and has read the provider's comments. Refill of crestor sent to pharmacy for patient.

## 2019-09-28 ENCOUNTER — Other Ambulatory Visit: Payer: Self-pay | Admitting: Family Medicine

## 2019-09-28 DIAGNOSIS — E1121 Type 2 diabetes mellitus with diabetic nephropathy: Secondary | ICD-10-CM

## 2019-09-28 NOTE — Telephone Encounter (Signed)
Requested Prescriptions  Pending Prescriptions Disp Refills  . losartan (COZAAR) 25 MG tablet [Pharmacy Med Name: LOSARTAN POTASSIUM 25 MG TAB] 90 tablet 0    Sig: TAKE 1 TABLET BY MOUTH DAILY     Cardiovascular:  Angiotensin Receptor Blockers Passed - 09/28/2019  8:00 PM      Passed - Cr in normal range and within 180 days    Creatinine  Date Value Ref Range Status  05/06/2019 1.1 0.6 - 1.3 Final   Creatinine, Ser  Date Value Ref Range Status  03/31/2019 0.99 0.76 - 1.27 mg/dL Final         Passed - K in normal range and within 180 days    Potassium  Date Value Ref Range Status  05/06/2019 4.6 3.4 - 5.3 Final         Passed - Patient is not pregnant      Passed - Last BP in normal range    BP Readings from Last 1 Encounters:  09/22/19 108/64         Passed - Valid encounter within last 6 months    Recent Outpatient Visits          6 days ago Type 2 diabetes mellitus without complication, without long-term current use of insulin [facility])   [facility] Jerrol Banana., MD   6 months ago Annual physical exam   [facility] Jerrol Banana., MD   1 year ago Type 2 diabetes mellitus without complication, without long-term current use of insulin [facility])   [facility] Jerrol Banana., MD   1 year ago Annual physical exam   [facility] Jerrol Banana., MD   1 year ago Type 2 diabetes mellitus with complication, without long-term current use of insulin [facility])   [facility] Jerrol Banana., MD      Future Appointments            In 6 months Jerrol Banana., MD [facility] [place]

## 2019-10-22 DIAGNOSIS — Z20822 Contact with and (suspected) exposure to covid-19: Secondary | ICD-10-CM | POA: Diagnosis not present

## 2019-10-28 DIAGNOSIS — M180 Bilateral primary osteoarthritis of first carpometacarpal joints: Secondary | ICD-10-CM | POA: Diagnosis not present

## 2019-10-28 DIAGNOSIS — M79644 Pain in right finger(s): Secondary | ICD-10-CM | POA: Diagnosis not present

## 2019-10-28 DIAGNOSIS — M79645 Pain in left finger(s): Secondary | ICD-10-CM | POA: Diagnosis not present

## 2019-10-28 DIAGNOSIS — M1812 Unilateral primary osteoarthritis of first carpometacarpal joint, left hand: Secondary | ICD-10-CM | POA: Diagnosis not present

## 2019-10-28 DIAGNOSIS — M1811 Unilateral primary osteoarthritis of first carpometacarpal joint, right hand: Secondary | ICD-10-CM | POA: Diagnosis not present

## 2019-10-28 DIAGNOSIS — R2230 Localized swelling, mass and lump, unspecified upper limb: Secondary | ICD-10-CM | POA: Diagnosis not present

## 2019-10-29 ENCOUNTER — Telehealth: Payer: Self-pay

## 2019-10-29 NOTE — Telephone Encounter (Signed)
° °  [facility] Pre-operative Risk Assessment    Request for surgical clearance:  1. What type of surgery is being performed? Left small finger mass excision with neuropla  2. When is this surgery scheduled? 11-19-2019   3. What type of clearance is required (medical clearance vs. Pharmacy clearance to hold med vs. Both)? both  4. Are there any medications that need to be held prior to surgery and how long?xarelto   5. Practice name and name of physician performing surgery? [facility] Roseanne Kaufman attn: cherry   6. What is the office phone number? [phone]   7.   What is the office fax number? [phone]  8.   Anesthesia type (None, local, MAC, general) ? NOT LISTED-LM2CB AFTER 5

## 2019-11-01 NOTE — Telephone Encounter (Signed)
   Primary Cardiologist: Thompson Grayer, MD  Chart reviewed as part of pre-operative protocol coverage. Patient returned call and affirms he is doing great without any new cardiac symptoms. Therefore, based on ACC/AHA guidelines, the patient would be at acceptable risk for the planned procedure without further cardiovascular testing.   The patient was advised that if he develops new symptoms prior to surgery to contact our office to arrange for a follow-up visit, and he verbalized understanding.  Per pharmD, Per office protocol, patient can hold Xarelto for 2 days prior to procedure.   I will route this recommendation to the requesting party via Epic fax function and remove from pre-op pool. Please call with questions.  Charlie Pitter, PA-C 11/01/2019, 11:53 AM

## 2019-11-01 NOTE — Telephone Encounter (Signed)
Patient with diagnosis of afib on Xarelto for anticoagulation.    Procedure:  Left small finger mass excision with neuropla Date of procedure: 11/19/19  CHADS2-VASc score of  3 (HTN, AGE, DM2)  CrCl 62 ml/min  Per office protocol, patient can hold Xarelto for 2 days prior to procedure.

## 2019-11-01 NOTE — Telephone Encounter (Addendum)
   Primary Cardiologist: Thompson Grayer, MD  Chart reviewed as part of pre-operative protocol coverage. Patient was contacted 11/01/2019 in reference to pre-operative risk assessment for pending surgery as outlined below.  Benjamin [name] was last seen on 03/03/19 by Adline Peals PA-C with history of DM, HLD, HTN, gout, cough syncope, and paroxysmal atrial fibrillation. Echo 2018 with normal LVEF. RCRI 0.4% indicating low risk of CV complications but need to speak with patient to ensure clinical stability. LMTCB.  Will route to pharm for input on anticoagulation.  Charlie Pitter, PA-C 11/01/2019, 11:25 AM

## 2019-11-01 NOTE — Telephone Encounter (Deleted)
.  coa

## 2019-11-09 ENCOUNTER — Other Ambulatory Visit: Payer: Self-pay | Admitting: Family Medicine

## 2019-11-09 DIAGNOSIS — E118 Type 2 diabetes mellitus with unspecified complications: Secondary | ICD-10-CM

## 2019-11-09 NOTE — Telephone Encounter (Signed)
Request for 90 day supply requested- next visit scheduled 03/2020

## 2019-11-11 NOTE — Telephone Encounter (Signed)
Forwarded to requesting provider via EPIC

## 2019-11-19 DIAGNOSIS — D481 Neoplasm of uncertain behavior of connective and other soft tissue: Secondary | ICD-10-CM | POA: Diagnosis not present

## 2019-11-19 DIAGNOSIS — M71342 Other bursal cyst, left hand: Secondary | ICD-10-CM | POA: Diagnosis not present

## 2019-12-01 DIAGNOSIS — Z23 Encounter for immunization: Secondary | ICD-10-CM | POA: Diagnosis not present

## 2019-12-02 DIAGNOSIS — M25642 Stiffness of left hand, not elsewhere classified: Secondary | ICD-10-CM | POA: Diagnosis not present

## 2019-12-10 ENCOUNTER — Other Ambulatory Visit: Payer: Self-pay | Admitting: Family Medicine

## 2019-12-23 DIAGNOSIS — M79644 Pain in right finger(s): Secondary | ICD-10-CM | POA: Diagnosis not present

## 2019-12-23 DIAGNOSIS — M1811 Unilateral primary osteoarthritis of first carpometacarpal joint, right hand: Secondary | ICD-10-CM | POA: Diagnosis not present

## 2019-12-30 ENCOUNTER — Other Ambulatory Visit: Payer: Self-pay | Admitting: Family Medicine

## 2019-12-30 DIAGNOSIS — E1121 Type 2 diabetes mellitus with diabetic nephropathy: Secondary | ICD-10-CM

## 2020-01-13 ENCOUNTER — Other Ambulatory Visit: Payer: Self-pay

## 2020-01-13 MED ORDER — RIVAROXABAN 20 MG PO TABS: ORAL_TABLET | ORAL | 1 refills | Status: DC

## 2020-01-13 NOTE — Telephone Encounter (Signed)
Age 69, weight 78kg, SCr 1.1 on 05/06/19, CrCl 43mL/min Last visit Jan 2021, afib indication

## 2020-02-09 ENCOUNTER — Other Ambulatory Visit: Payer: Self-pay | Admitting: Family Medicine

## 2020-02-09 DIAGNOSIS — E118 Type 2 diabetes mellitus with unspecified complications: Secondary | ICD-10-CM

## 2020-02-21 DIAGNOSIS — M1811 Unilateral primary osteoarthritis of first carpometacarpal joint, right hand: Secondary | ICD-10-CM | POA: Diagnosis not present

## 2020-02-21 DIAGNOSIS — M79644 Pain in right finger(s): Secondary | ICD-10-CM | POA: Diagnosis not present

## 2020-02-21 DIAGNOSIS — M1812 Unilateral primary osteoarthritis of first carpometacarpal joint, left hand: Secondary | ICD-10-CM | POA: Diagnosis not present

## 2020-02-21 DIAGNOSIS — M79645 Pain in left finger(s): Secondary | ICD-10-CM | POA: Diagnosis not present

## 2020-02-21 DIAGNOSIS — M180 Bilateral primary osteoarthritis of first carpometacarpal joints: Secondary | ICD-10-CM | POA: Diagnosis not present

## 2020-03-03 ENCOUNTER — Ambulatory Visit: Payer: PPO | Admitting: Internal Medicine

## 2020-03-03 ENCOUNTER — Other Ambulatory Visit: Payer: Self-pay

## 2020-03-03 ENCOUNTER — Encounter: Payer: Self-pay | Admitting: Internal Medicine

## 2020-03-03 VITALS — BP 126/66 | HR 65 | Ht 69.0 in | Wt 175.8 lb

## 2020-03-03 DIAGNOSIS — I10 Essential (primary) hypertension: Secondary | ICD-10-CM

## 2020-03-03 DIAGNOSIS — I480 Paroxysmal atrial fibrillation: Secondary | ICD-10-CM | POA: Diagnosis not present

## 2020-03-03 MED ORDER — METOPROLOL TARTRATE 50 MG PO TABS: ORAL_TABLET | ORAL | 3 refills | Status: DC

## 2020-03-03 NOTE — Progress Notes (Signed)
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 male who presents today for routine electrophysiology followup.  Since last being seen in our clinic, the patient reports doing very well.  He has rare palpitations, primarily after exercise which are short lived.  Today, he denies symptoms of chest pain, shortness of breath,  lower extremity edema, dizziness, presyncope, or syncope.  The patient is otherwise without complaint today.   Past Medical History:  Diagnosis Date  . Atrial fibrillation (HCC)    paroxysmal  . Cancer ([place])    SCC removed from neck  . Cough syncope   . Diabetes mellitus   . Dyslipidemia   . Gout   . Hyperlipidemia   . Hypertension   . Hypopotassemia   . Squamous cell carcinoma of skin 10/31/2016   right lat neck post aspect (in situ)   Past Surgical History:  Procedure Laterality Date  . COLONOSCOPY WITH PROPOFOL N/A 11/07/2016   Procedure: COLONOSCOPY WITH PROPOFOL;  Surgeon: Lollie Sails, MD;  Location: [facility] ENDOSCOPY;  Service: Endoscopy;  Laterality: N/A;  . TONSILLECTOMY AND ADENOIDECTOMY      ROS- all systems are reviewed and negatives except as per HPI above  Current Outpatient Medications  Medication Sig Dispense Refill  . allopurinol (ZYLOPRIM) 300 MG tablet TAKE TWO TABLETS EVERY DAY 180 tablet 0  . Cholecalciferol (VITAMIN D3) 2000 UNITS TABS Take 1 tablet by mouth daily.    Marland Kitchen CINNAMON PO Take 2,000 mg by mouth daily.    Marland Kitchen losartan (COZAAR) 25 MG tablet TAKE 1 TABLET BY MOUTH DAILY 90 tablet 0  . metFORMIN (GLUCOPHAGE-XR) 500 MG 24 hr tablet TAKE TWO TABLETS TWICE DAILY 360 tablet 1  . metoprolol tartrate (LOPRESSOR) 50 MG tablet Take one-half tablet (25 mg) by mouth twice daily as needed for palpitations and/or fast heart rates 60 tablet 3  . Omega-3 Fatty Acids (FISH OIL) 1000 MG CAPS Take 1 capsule by mouth daily.    Glory Rosebush VERIO test strip CHECK BLOOD SUGAR DAILY 50 each 11  . rivaroxaban  (XARELTO) 20 MG TABS tablet Take 1 tablet by mouth daily with supper 90 tablet 1  . rosuvastatin (CRESTOR) 10 MG tablet TAKE 1 TABLET BY MOUTH DAILY 90 tablet 3  . rosuvastatin (CRESTOR) 20 MG tablet Take 1 tablet (20 mg total) by mouth daily. 90 tablet 1  . sildenafil (REVATIO) 20 MG tablet TAKE 1 TABLET 3 TIMES A DAY AS NEEDED 50 tablet 3  . TURMERIC PO Take by mouth daily. Dose unknown    . vitamin B-12 (CYANOCOBALAMIN) 1000 MCG tablet Take 1,000 mcg by mouth daily.    Marland Kitchen zinc gluconate 50 MG tablet Take 50 mg by mouth daily.     No current facility-administered medications for this visit.    Physical Exam: Vitals:   03/03/20 0946  BP: 126/66  Pulse: 65  SpO2: 95%  Weight: 175 lb 12.8 oz (79.7 kg)  Height: 5\' 9"  (1.753 m)    GEN- The patient is well appearing, alert and oriented x 3 today.   Head- normocephalic, atraumatic Eyes-  Sclera clear, conjunctiva pink Ears- hearing intact Oropharynx- clear Lungs- Clear to ausculation bilaterally, normal work of breathing Heart- Regular rate and rhythm, no murmurs, rubs or gallops, PMI not laterally displaced GI- soft, NT, ND, + BS Extremities- no clubbing, cyanosis, or edema  Wt Readings from Last 3 Encounters:  03/03/20 175 lb 12.8 oz (79.7  kg)  09/22/19 172 lb (78 kg)  03/25/19 172 lb (78 kg)    EKG tracing ordered today is personally reviewed and shows sinus rhythm 65 bpm, PR 228 msec,  Assessment and Plan:  1. Paroxysmal atrial fibrillation Doing very well post ablation chads2vasc score is at least 3. Continue xarelto He has rare palpitations but is pleased with current rhythm control  2. HTN Stable No change required today  3. HL Continue statin  Risks, benefits and potential toxicities for medications prescribed and/or refilled reviewed with patient today.   Return to see EP PA in a year  Thompson Grayer MD, [facility] 03/03/2020 9:51 AM

## 2020-03-03 NOTE — Patient Instructions (Signed)
Medication Instructions:  none *If you need a refill on your cardiac medications before your next appointment, please call your pharmacy*   Lab Work: none If you have labs (blood work) drawn today and your tests are completely normal, you will receive your results only by: Marland Kitchen MyChart Message (if you have MyChart) OR . A paper copy in the mail If you have any lab test that is abnormal or we need to change your treatment, we will call you to review the results.   Testing/Procedures: none   Follow-Up: At [facility], you and your health needs are our priority.  As part of our continuing mission to provide you with exceptional heart care, we have created designated Provider Care Teams.  These Care Teams include your primary Cardiologist (physician) and Advanced Practice Providers (APPs -  Physician Assistants and Nurse Practitioners) who all work together to provide you with the care you need, when you need it.  We recommend signing up for the patient portal called "MyChart".  Sign up information is provided on this After Visit Summary.  MyChart is used to connect with patients for Virtual Visits (Telemedicine).  Patients are able to view lab/test results, encounter notes, upcoming appointments, etc.  Non-urgent messages can be sent to your provider as well.   To learn more about what you can do with MyChart, go to [email or web address].    Your next appointment:   12 month(s)  The format for your next appointment:   In Person  Provider:   Tommye Standard, PA-C   Other Instructions none

## 2020-03-14 ENCOUNTER — Other Ambulatory Visit: Payer: Self-pay | Admitting: Family Medicine

## 2020-03-15 ENCOUNTER — Telehealth: Payer: Self-pay | Admitting: *Deleted

## 2020-03-15 DIAGNOSIS — E119 Type 2 diabetes mellitus without complications: Secondary | ICD-10-CM | POA: Diagnosis not present

## 2020-03-15 LAB — HM DIABETES EYE EXAM

## 2020-03-15 NOTE — Chronic Care Management (AMB) (Signed)
  Chronic Care Management   Note  03/15/2020 Name: Benjamin [name] MRN: [id number] DOB: [birth year]  Benjamin [name] is a 70 y.o. year old male who is a primary care patient of Jerrol Banana., MD. I reached out to [name] by phone today in response to a referral sent by Benjamin [name]'s health plan.     Benjamin [name] was given information about Chronic Care Management services today including:  1. CCM service includes personalized support from designated clinical staff supervised by his physician, including individualized plan of care and coordination with other care providers 2. 24/7 contact phone numbers for assistance for urgent and routine care needs. 3. Service will only be billed when office clinical staff spend 20 minutes or more in a month to coordinate care. 4. Only one practitioner may furnish and bill the service in a calendar month. 5. The patient may stop CCM services at any time (effective at the end of the month) by phone call to the office staff. 6. The patient will be responsible for cost sharing (co-pay) of up to 20% of the service fee (after annual deductible is met).  Patient wishes to consider information provided and/or speak with PCP, Jerrol Banana., MD at upcoming appointment before deciding about enrollment in care management services.   Follow up plan: The care management team is available to follow up with the patient after provider conversation with the patient regarding recommendation for care management engagement and subsequent re-referral to the care management team. If patient returns call to provider office, please advise to call [place] at [phone].  [name]

## 2020-03-15 NOTE — Chronic Care Management (AMB) (Signed)
  Chronic Care Management   Outreach Note  03/15/2020 Name: Benjamin [name] MRN: [id number] DOB: 1950/09/01  JONATHEN RATHMAN is a 70 y.o. year old male who is a primary care patient of Jerrol Banana., MD. I reached out to [name] by phone today in response to a referral sent by Mr. Keldrick Pomplun Allum's health plan.     An unsuccessful telephone outreach was attempted today. The patient was referred to the case management team for assistance with care management and care coordination.   Follow Up Plan: A HIPAA compliant phone message was left for the patient providing contact information and requesting a return call. The care management team will reach out to the patient again over the next 7 days. If patient returns call to provider office, please advise to call [facility] at [phone].  [place] [facility]

## 2020-03-28 ENCOUNTER — Other Ambulatory Visit: Payer: Self-pay | Admitting: Family Medicine

## 2020-03-28 DIAGNOSIS — E1121 Type 2 diabetes mellitus with diabetic nephropathy: Secondary | ICD-10-CM

## 2020-04-02 ENCOUNTER — Other Ambulatory Visit: Payer: Self-pay | Admitting: Family Medicine

## 2020-04-02 DIAGNOSIS — E785 Hyperlipidemia, unspecified: Secondary | ICD-10-CM

## 2020-04-03 NOTE — Progress Notes (Signed)
Complete physical exam   Patient: Benjamin [name]   DOB: 27-Feb-1950   70 y.o. Male  MRN: [id number] Visit Date: 04/04/2020  Today's healthcare provider: Wilhemena Durie, MD   Chief Complaint  Patient presents with  . Annual Exam   Subjective    Benjamin [name] is a 70 y.o. male who presents today for a complete physical exam.  He reports consuming a low fat diet. Gym/ health club routine includes cardio and treadmill. He generally feels well. He reports sleeping well. He does not have additional problems to discuss today.  He is married and has 2 sons. Continues to work. HPI  Blood sugars have been running up a little recently and he complains of pain in both thumbs.  His atrial fed was also been kicking up recently. Patient had AWV with [facility] on 06/03/2019.  Past Medical History:  Diagnosis Date  . Atrial fibrillation (HCC)    paroxysmal  . Cancer ([place])    SCC removed from neck  . Cough syncope   . Diabetes mellitus   . Dyslipidemia   . Gout   . Hyperlipidemia   . Hypertension   . Hypopotassemia   . Squamous cell carcinoma of skin 10/31/2016   right lat neck post aspect (in situ)   Past Surgical History:  Procedure Laterality Date  . COLONOSCOPY WITH PROPOFOL N/A 11/07/2016   Procedure: COLONOSCOPY WITH PROPOFOL;  Surgeon: Lollie Sails, MD;  Location: [facility] ENDOSCOPY;  Service: Endoscopy;  Laterality: N/A;  . TONSILLECTOMY AND ADENOIDECTOMY     Social History   Socioeconomic History  . Marital status: Married    Spouse name: Baldo Ash  . Number of children: 2  . Years of education: 16  . Highest education level: Bachelor's degree (e.g., BA, AB, BS)  Occupational History  . 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 Smoking status: Former Smoker    Types: Cigarettes    Quit date: 01/23/1971    Years since quitting: 49.2  . Smokeless tobacco: Never Used  Vaping Use  . Vaping Use: Never used   Substance and Sexual Activity  . Alcohol use: Yes    Alcohol/week: 4.0 standard drinks    Types: 4 Glasses of wine per week    Comment: only on weekends  . Drug use: No  . Sexual activity: Yes  Other Topics Concern  . Not on file  Social History Narrative    SOCIAL HISTORY:  He lives in [place] with his wife.  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  He has 2 adult sons.  He exercises as stated above.  No tobacco or illicit substance use.  He does enjoy occasional beer and vodka, mixed drink on the weekends.  He tries to follow a low-cholesterol diet.          FAMILY HISTORY:  Mother is alive, she has a pacemaker secondary to heart block, she also has a stable abdominal aneurysm.  Father's health, interesting that he had an MI at age 55 and then apparently died in his sleep at age 71 that was felt to be secondary to MI.  He has 3 siblings who are alive and well.    Social Determinants of Health   Financial Resource Strain: Low Risk   . Difficulty of Paying Living Expenses: Not hard at all  Food Insecurity: No Food Insecurity  . Worried About Estate manager/land agent  of Food in the Last Year: Never true  . Ran Out of Food in the Last Year: Never true  Transportation Needs: No Transportation Needs  . Lack of Transportation (Medical): No  . Lack of Transportation (Non-Medical): No  Physical Activity: Sufficiently Active  . Days of Exercise per Week: 5 days  . Minutes of Exercise per Session: 40 min  Stress: No Stress Concern Present  . Feeling of Stress : Not at all  Social Connections: Moderately Integrated  . Frequency of Communication with Friends and Family: More than three times a week  . Frequency of Social Gatherings with Friends and Family: More than three times a week  . Attends Religious Services: More than 4 times per year  . Active Member of Clubs or Organizations: No  . Attends Archivist Meetings: Never  . Marital Status: Married  Human resources officer  Violence: Not At Risk  . Fear of Current or Ex-Partner: No  . Emotionally Abused: No  . Physically Abused: No  . Sexually Abused: No   Family Status  Relation Name Status  . Mother  Alive  . Father  Deceased at age 67       Mi  . Sister  Alive  . Brother  Alive  . Sister  Alive  . Son  Alive  . Son  Alive   Family History  Problem Relation Age of Onset  . Atrial fibrillation Mother   . Ovarian cancer Mother   . Congestive Heart Failure Mother   . Aneurysm Mother   . Heart attack Father   . Diabetes Father   . Hypertension Father   . Ulcers Father   . Gout Father   . CAD Father   . Other Son        ITP  . Gout Son   . Other Son        ITP  . Hearing loss Son    No Known Allergies  Patient Care Team: Jerrol Banana., MD as PCP - General (Family Medicine) Thompson Grayer, MD as PCP - Cardiology (Cardiology) Birder Robson, MD as Referring Physician (Ophthalmology) Thompson Grayer, MD as Consulting Physician (Cardiology) Ralene Bathe, MD as Consulting Physician (Dermatology) Prudencio Pair as Physician Assistant (Emergency Medicine)   Medications: Outpatient Medications Prior to Visit  Medication Sig  . allopurinol (ZYLOPRIM) 300 MG tablet TAKE TWO TABLETS EVERY DAY  . Cholecalciferol (VITAMIN D3) 2000 UNITS TABS Take 1 tablet by mouth daily.  Marland Kitchen CINNAMON PO Take 2,000 mg by mouth daily.  Marland Kitchen losartan (COZAAR) 25 MG tablet TAKE 1 TABLET BY MOUTH DAILY  . metFORMIN (GLUCOPHAGE-XR) 500 MG 24 hr tablet TAKE TWO TABLETS TWICE DAILY  . metoprolol tartrate (LOPRESSOR) 50 MG tablet Take one-half tablet (25 mg) by mouth twice daily as needed for palpitations and/or fast heart rates  . Omega-3 Fatty Acids (FISH OIL) 1000 MG CAPS Take 1 capsule by mouth daily.  Glory Rosebush VERIO test strip CHECK BLOOD SUGAR DAILY  . rivaroxaban (XARELTO) 20 MG TABS tablet Take 1 tablet by mouth daily with supper  . rosuvastatin (CRESTOR) 10 MG tablet TAKE 1 TABLET BY MOUTH  DAILY  . rosuvastatin (CRESTOR) 20 MG tablet TAKE ONE TABLET BY MOUTH EVERY DAY (DOSAGE INCREASE )  . sildenafil (REVATIO) 20 MG tablet TAKE 1 TABLET 3 TIMES A DAY AS NEEDED  . TURMERIC PO Take by mouth daily. Dose unknown  . vitamin B-12 (CYANOCOBALAMIN) 1000 MCG tablet Take 1,000 mcg by  mouth daily.  Marland Kitchen zinc gluconate 50 MG tablet Take 50 mg by mouth daily.   No facility-administered medications prior to visit.    Review of Systems  All other systems reviewed and are negative.      Objective    BP (!) 144/62   Pulse 98 Comment: irregular  Temp 97.9 F (36.6 C)   Resp 16   Ht 5\' 9"  (1.753 m)   Wt 174 lb (78.9 kg)   BMI 25.70 kg/m  BP Readings from Last 3 Encounters:  04/04/20 (!) 144/62  03/03/20 126/66  09/22/19 108/64   Wt Readings from Last 3 Encounters:  04/04/20 174 lb (78.9 kg)  03/03/20 175 lb 12.8 oz (79.7 kg)  09/22/19 172 lb (78 kg)      Physical Exam Vitals reviewed.  Constitutional:      Appearance: Normal appearance. He is normal weight.  HENT:     Head: Normocephalic and atraumatic.     Right Ear: Tympanic membrane, ear canal and external ear normal.     Left Ear: Tympanic membrane, ear canal and external ear normal.     Nose: Nose normal.     Mouth/Throat:     Mouth: Mucous membranes are dry.     Pharynx: Oropharynx is clear.  Eyes:     Extraocular Movements: Extraocular movements intact.     Conjunctiva/sclera: Conjunctivae normal.     Pupils: Pupils are equal, round, and reactive to light.  Cardiovascular:     Rate and Rhythm: Normal rate and regular rhythm.     Pulses: Normal pulses.     Heart sounds: Normal heart sounds.  Pulmonary:     Effort: Pulmonary effort is normal.     Breath sounds: Normal breath sounds.  Abdominal:     General: Abdomen is flat. Bowel sounds are normal.     Palpations: Abdomen is soft.  Genitourinary:    Penis: Normal.      Testes: Normal.     Prostate: Normal.     Rectum: Normal.  Musculoskeletal:         General: Normal range of motion.     Cervical back: Normal range of motion and neck supple.  Skin:    General: Skin is warm and dry.  Neurological:     General: No focal deficit present.     Mental Status: He is alert and oriented to person, place, and time. Mental status is at baseline.  Psychiatric:        Mood and Affect: Mood normal.        Behavior: Behavior normal.        Thought Content: Thought content normal.        Judgment: Judgment normal.       Last depression screening scores PHQ 2/9 Scores 04/04/2020 03/25/2019 03/19/2018  PHQ - 2 Score 0 0 0  PHQ- 9 Score 1 0 -   Last fall risk screening Fall Risk  04/04/2020  Falls in the past year? 0  Number falls in past yr: 0  Injury with Fall? 0  Risk for fall due to : No Fall Risks  Follow up Falls evaluation completed   Last Audit-C alcohol use screening Alcohol Use Disorder Test (AUDIT) 04/04/2020  1. How often do you have a drink containing alcohol? 3  2. How many drinks containing alcohol do you have on a typical day when you are drinking? 0  3. How often do you have six or more drinks on one occasion? 0  AUDIT-C Score 3  4. How often during the last year have you found that you were not able to stop drinking once you had started? 0  5. How often during the last year have you failed to do what was normally expected from you because of drinking? 0  6. How often during the last year have you needed a first drink in the morning to get yourself going after a heavy drinking session? 0  7. How often during the last year have you had a feeling of guilt of remorse after drinking? 0  8. How often during the last year have you been unable to remember what happened the night before because you had been drinking? 0  9. Have you or someone else been injured as a result of your drinking? 0  10. Has a relative or friend or a doctor or another health worker been concerned about your drinking or suggested you cut down? 0  Alcohol Use  Disorder Identification Test Final Score (AUDIT) 3  Alcohol Brief Interventions/Follow-up AUDIT Score <7 follow-up not indicated   A score of 3 or more in women, and 4 or more in men indicates increased risk for alcohol abuse, EXCEPT if all of the points are from question 1   No results found for any visits on 04/04/20.  Assessment & Plan    Routine Health Maintenance and Physical Exam  Exercise Activities and Dietary recommendations Goals   None     Immunization History  Administered Date(s) Administered  . Fluad Quad(high Dose 65+) 11/03/2018  . Influenza, High Dose Seasonal PF 10/19/2015, 10/16/2017  . Influenza-Unspecified 11/12/2014  . Moderna Sars-Covid-2 Vaccination 03/16/2019  . Pneumococcal Conjugate-13 09/13/2014  . Pneumococcal Polysaccharide-23 02/06/2000, 06/07/2009  . Td 11/10/2003  . Tdap 10/10/2010  . Zoster 11/14/2010    Health Maintenance  Topic Date Due  . PNA vac Low Risk Adult (2 of 2 - PPSV23) 09/13/2015  . FOOT EXAM  02/13/2018  . COVID-19 Vaccine (2 - Moderna risk 4-dose series) 04/13/2019  . HEMOGLOBIN A1C  03/24/2020  . TETANUS/TDAP  10/09/2020  . OPHTHALMOLOGY EXAM  03/15/2021  . COLONOSCOPY (Pts 45-22yrs Insurance coverage will need to be confirmed)  11/08/2026  . INFLUENZA VACCINE  Completed  . Hepatitis C Screening  Completed    Discussed health benefits of physical activity, and encouraged him to engage in regular exercise appropriate for his age and condition.   1. Annual physical exam   2. Primary hypertension  - Comprehensive metabolic panel - TSH  3. Hyperlipidemia, unspecified hyperlipidemia type  - CBC with Differential/Platelet - Lipid panel  4. Type 2 diabetes mellitus without complication, without long-term current use of insulin (HCC)  - Hemoglobin A1c  5. Prostate cancer screening  - PSA 6.  A. Fib  7.  Thumb pain  No follow-ups on file.     I, Wilhemena Durie, MD, have reviewed all documentation for  this visit. The documentation on 04/08/20 for the exam, diagnosis, procedures, and orders are all accurate and complete.    Daxtin Leiker Cranford Mon, MD  [facility] [phone] (phone) [phone] (fax)  [place]

## 2020-04-04 ENCOUNTER — Ambulatory Visit (INDEPENDENT_AMBULATORY_CARE_PROVIDER_SITE_OTHER): Payer: PPO | Admitting: Family Medicine

## 2020-04-04 ENCOUNTER — Encounter: Payer: Self-pay | Admitting: Family Medicine

## 2020-04-04 ENCOUNTER — Other Ambulatory Visit: Payer: Self-pay

## 2020-04-04 VITALS — BP 144/62 | HR 98 | Temp 97.9°F | Resp 16 | Ht 69.0 in | Wt 174.0 lb

## 2020-04-04 DIAGNOSIS — Z125 Encounter for screening for malignant neoplasm of prostate: Secondary | ICD-10-CM | POA: Diagnosis not present

## 2020-04-04 DIAGNOSIS — I10 Essential (primary) hypertension: Secondary | ICD-10-CM

## 2020-04-04 DIAGNOSIS — E119 Type 2 diabetes mellitus without complications: Secondary | ICD-10-CM | POA: Diagnosis not present

## 2020-04-04 DIAGNOSIS — E785 Hyperlipidemia, unspecified: Secondary | ICD-10-CM

## 2020-04-04 DIAGNOSIS — Z0000 Encounter for general adult medical examination without abnormal findings: Secondary | ICD-10-CM

## 2020-04-05 LAB — CBC WITH DIFFERENTIAL/PLATELET
Basophils Absolute: 0.1 10*3/uL (ref 0.0–0.2)
Basos: 1 %
EOS (ABSOLUTE): 0.1 10*3/uL (ref 0.0–0.4)
Eos: 1 %
Hematocrit: 44.2 % (ref 37.5–51.0)
Hemoglobin: 15.2 g/dL (ref 13.0–17.7)
Immature Grans (Abs): 0 10*3/uL (ref 0.0–0.1)
Immature Granulocytes: 0 %
Lymphocytes Absolute: 1.9 10*3/uL (ref 0.7–3.1)
Lymphs: 23 %
MCH: 28.8 pg (ref 26.6–33.0)
MCHC: 34.4 g/dL (ref 31.5–35.7)
MCV: 84 fL (ref 79–97)
Monocytes Absolute: 0.8 10*3/uL (ref 0.1–0.9)
Monocytes: 10 %
Neutrophils Absolute: 5.2 10*3/uL (ref 1.4–7.0)
Neutrophils: 65 %
Platelets: 282 10*3/uL (ref 150–450)
RBC: 5.27 x10E6/uL (ref 4.14–5.80)
RDW: 13.2 % (ref 11.6–15.4)
WBC: 8 10*3/uL (ref 3.4–10.8)

## 2020-04-05 LAB — COMPREHENSIVE METABOLIC PANEL
ALT: 24 IU/L (ref 0–44)
AST: 20 IU/L (ref 0–40)
Albumin/Globulin Ratio: 2 (ref 1.2–2.2)
Albumin: 4.7 g/dL (ref 3.8–4.8)
Alkaline Phosphatase: 74 IU/L (ref 44–121)
BUN/Creatinine Ratio: 23 (ref 10–24)
BUN: 26 mg/dL (ref 8–27)
Bilirubin Total: 0.6 mg/dL (ref 0.0–1.2)
CO2: 21 mmol/L (ref 20–29)
Calcium: 9.6 mg/dL (ref 8.6–10.2)
Chloride: 103 mmol/L (ref 96–106)
Creatinine, Ser: 1.12 mg/dL (ref 0.76–1.27)
GFR calc Af Amer: 77 mL/min/{1.73_m2} (ref >59)
GFR calc non Af Amer: 66 mL/min/{1.73_m2} (ref >59)
Globulin, Total: 2.4 g/dL (ref 1.5–4.5)
Glucose: 248 mg/dL — ABNORMAL HIGH (ref 65–99)
Potassium: 4.5 mmol/L (ref 3.5–5.2)
Sodium: 141 mmol/L (ref 134–144)
Total Protein: 7.1 g/dL (ref 6.0–8.5)

## 2020-04-05 LAB — LIPID PANEL
Chol/HDL Ratio: 3.5 ratio (ref 0.0–5.0)
Cholesterol, Total: 133 mg/dL (ref 100–199)
HDL: 38 mg/dL — ABNORMAL LOW (ref >39)
LDL Chol Calc (NIH): 59 mg/dL (ref 0–99)
Triglycerides: 220 mg/dL — ABNORMAL HIGH (ref 0–149)
VLDL Cholesterol Cal: 36 mg/dL (ref 5–40)

## 2020-04-05 LAB — PSA: Prostate Specific Ag, Serum: 0.7 ng/mL (ref 0.0–4.0)

## 2020-04-05 LAB — HEMOGLOBIN A1C
Est. average glucose Bld gHb Est-mCnc: 214 mg/dL
Hgb A1c MFr Bld: 9.1 % — ABNORMAL HIGH (ref 4.8–5.6)

## 2020-04-05 LAB — TSH: TSH: 1.85 u[IU]/mL (ref 0.450–4.500)

## 2020-04-10 ENCOUNTER — Other Ambulatory Visit: Payer: Self-pay | Admitting: Family Medicine

## 2020-04-10 DIAGNOSIS — E118 Type 2 diabetes mellitus with unspecified complications: Secondary | ICD-10-CM

## 2020-04-11 ENCOUNTER — Encounter: Payer: Self-pay | Admitting: Family Medicine

## 2020-04-11 ENCOUNTER — Other Ambulatory Visit: Payer: Self-pay | Admitting: *Deleted

## 2020-04-11 NOTE — Telephone Encounter (Signed)
Labs okay but A1c up to 9.1.  Will consider adding Jardiance 10 mg or Farxiga 5 mg to his regimen whichever one is covered by insurance.  Might have samples of the first month and he can check his sugar and see me back after that month

## 2020-04-12 ENCOUNTER — Other Ambulatory Visit: Payer: Self-pay | Admitting: Family Medicine

## 2020-04-12 ENCOUNTER — Other Ambulatory Visit: Payer: Self-pay | Admitting: *Deleted

## 2020-04-12 DIAGNOSIS — E119 Type 2 diabetes mellitus without complications: Secondary | ICD-10-CM

## 2020-04-12 MED ORDER — EMPAGLIFLOZIN 10 MG PO TABS: 10.0000 mg | ORAL_TABLET | Freq: Every day | ORAL | 1 refills | Status: DC

## 2020-04-12 NOTE — Progress Notes (Signed)
Try invokana

## 2020-04-12 NOTE — Telephone Encounter (Signed)
Was advised by Sharyn Lull per patient.

## 2020-05-01 ENCOUNTER — Other Ambulatory Visit: Payer: Self-pay | Admitting: Family Medicine

## 2020-05-01 DIAGNOSIS — E1121 Type 2 diabetes mellitus with diabetic nephropathy: Secondary | ICD-10-CM

## 2020-05-05 DIAGNOSIS — M79645 Pain in left finger(s): Secondary | ICD-10-CM | POA: Diagnosis not present

## 2020-05-05 DIAGNOSIS — Z6825 Body mass index (BMI) 25.0-25.9, adult: Secondary | ICD-10-CM | POA: Diagnosis not present

## 2020-05-05 DIAGNOSIS — E663 Overweight: Secondary | ICD-10-CM | POA: Diagnosis not present

## 2020-05-05 DIAGNOSIS — M25511 Pain in right shoulder: Secondary | ICD-10-CM | POA: Diagnosis not present

## 2020-05-05 DIAGNOSIS — M1A09X0 Idiopathic chronic gout, multiple sites, without tophus (tophi): Secondary | ICD-10-CM | POA: Diagnosis not present

## 2020-05-05 DIAGNOSIS — M79644 Pain in right finger(s): Secondary | ICD-10-CM | POA: Diagnosis not present

## 2020-05-05 DIAGNOSIS — M150 Primary generalized (osteo)arthritis: Secondary | ICD-10-CM | POA: Diagnosis not present

## 2020-05-09 ENCOUNTER — Other Ambulatory Visit: Payer: Self-pay | Admitting: Family Medicine

## 2020-05-09 DIAGNOSIS — E118 Type 2 diabetes mellitus with unspecified complications: Secondary | ICD-10-CM

## 2020-05-30 ENCOUNTER — Other Ambulatory Visit: Payer: Self-pay | Admitting: Family Medicine

## 2020-05-30 DIAGNOSIS — E1121 Type 2 diabetes mellitus with diabetic nephropathy: Secondary | ICD-10-CM

## 2020-06-26 ENCOUNTER — Other Ambulatory Visit: Payer: Self-pay | Admitting: Family Medicine

## 2020-06-26 DIAGNOSIS — E1121 Type 2 diabetes mellitus with diabetic nephropathy: Secondary | ICD-10-CM

## 2020-07-04 ENCOUNTER — Other Ambulatory Visit: Payer: Self-pay | Admitting: Family Medicine

## 2020-07-04 DIAGNOSIS — E785 Hyperlipidemia, unspecified: Secondary | ICD-10-CM

## 2020-07-04 NOTE — Telephone Encounter (Signed)
Requested Prescriptions  Pending Prescriptions Disp Refills  . rosuvastatin (CRESTOR) 20 MG tablet [Pharmacy Med Name: ROSUVASTATIN CALCIUM 20 MG TAB] 90 tablet 1    Sig: TAKE ONE TABLET BY MOUTH EVERY DAY (DOSAGE INCREASE )     Cardiovascular:  Antilipid - Statins Failed - 07/04/2020  1:21 PM      Failed - HDL in normal range and within 360 days    HDL  Date Value Ref Range Status  04/04/2020 38 (L) >39 mg/dL Final         Failed - Triglycerides in normal range and within 360 days    Triglycerides  Date Value Ref Range Status  04/04/2020 220 (H) 0 - 149 mg/dL Final         Passed - Total Cholesterol in normal range and within 360 days    Cholesterol, Total  Date Value Ref Range Status  04/04/2020 133 100 - 199 mg/dL Final         Passed - LDL in normal range and within 360 days    LDL Chol Calc (NIH)  Date Value Ref Range Status  04/04/2020 59 0 - 99 mg/dL Final         Passed - Patient is not pregnant      Passed - Valid encounter within last 12 months    Recent Outpatient Visits          3 months ago Annual physical exam   [facility] Jerrol Banana., MD   9 months ago Type 2 diabetes mellitus without complication, without long-term current use of insulin [facility])   [facility] Jerrol Banana., MD   1 year ago Annual physical exam   [facility] Jerrol Banana., MD   1 year ago Type 2 diabetes mellitus without complication, without long-term current use of insulin [facility])   [facility] Jerrol Banana., MD   2 years ago Annual physical exam   [facility] Jerrol Banana., MD      Future Appointments            In 1 month Jerrol Banana., MD [facility], [place]

## 2020-07-06 DIAGNOSIS — M7501 Adhesive capsulitis of right shoulder: Secondary | ICD-10-CM | POA: Diagnosis not present

## 2020-07-06 DIAGNOSIS — M180 Bilateral primary osteoarthritis of first carpometacarpal joints: Secondary | ICD-10-CM | POA: Diagnosis not present

## 2020-07-08 ENCOUNTER — Other Ambulatory Visit: Payer: Self-pay | Admitting: Family Medicine

## 2020-07-08 DIAGNOSIS — E119 Type 2 diabetes mellitus without complications: Secondary | ICD-10-CM

## 2020-07-12 ENCOUNTER — Other Ambulatory Visit: Payer: Self-pay | Admitting: *Deleted

## 2020-07-12 MED ORDER — RIVAROXABAN 20 MG PO TABS: ORAL_TABLET | ORAL | 1 refills | Status: DC

## 2020-07-12 NOTE — Telephone Encounter (Signed)
Prescription refill request for Xarelto received.  Indication: Afib  Last office visit: allred, 03/03/2020 Weight: 78.9 kg  Age: 70 yo  Scr: 1.12, 04/04/2020 CrCl: 67 ml/min   Pt is on the correct dose of Xarelto per dosing criteria, prescription refill sent for Xarelto 20mg  to [facility].

## 2020-07-28 ENCOUNTER — Other Ambulatory Visit: Payer: Self-pay | Admitting: Family Medicine

## 2020-07-28 DIAGNOSIS — E1121 Type 2 diabetes mellitus with diabetic nephropathy: Secondary | ICD-10-CM

## 2020-08-01 DIAGNOSIS — M25511 Pain in right shoulder: Secondary | ICD-10-CM | POA: Diagnosis not present

## 2020-08-03 ENCOUNTER — Ambulatory Visit: Payer: Self-pay | Admitting: Family Medicine

## 2020-08-11 DIAGNOSIS — M25511 Pain in right shoulder: Secondary | ICD-10-CM | POA: Diagnosis not present

## 2020-08-21 ENCOUNTER — Other Ambulatory Visit: Payer: Self-pay

## 2020-08-21 ENCOUNTER — Ambulatory Visit (INDEPENDENT_AMBULATORY_CARE_PROVIDER_SITE_OTHER): Payer: PPO | Admitting: Family Medicine

## 2020-08-21 ENCOUNTER — Encounter: Payer: Self-pay | Admitting: Family Medicine

## 2020-08-21 VITALS — BP 132/64 | HR 58 | Temp 97.7°F | Resp 16 | Ht 69.0 in | Wt 169.0 lb

## 2020-08-21 DIAGNOSIS — M109 Gout, unspecified: Secondary | ICD-10-CM | POA: Diagnosis not present

## 2020-08-21 DIAGNOSIS — S46001A Unspecified injury of muscle(s) and tendon(s) of the rotator cuff of right shoulder, initial encounter: Secondary | ICD-10-CM | POA: Diagnosis not present

## 2020-08-21 DIAGNOSIS — I10 Essential (primary) hypertension: Secondary | ICD-10-CM

## 2020-08-21 DIAGNOSIS — E119 Type 2 diabetes mellitus without complications: Secondary | ICD-10-CM | POA: Diagnosis not present

## 2020-08-21 DIAGNOSIS — I482 Chronic atrial fibrillation, unspecified: Secondary | ICD-10-CM

## 2020-08-21 DIAGNOSIS — E785 Hyperlipidemia, unspecified: Secondary | ICD-10-CM

## 2020-08-21 LAB — POCT GLYCOSYLATED HEMOGLOBIN (HGB A1C): Hemoglobin A1C: 7 % — AB (ref 4.0–5.6)

## 2020-08-21 NOTE — Progress Notes (Signed)
Established patient visit   Patient: Benjamin [name]   DOB: Jun 19, 1950   70 y.o. Male  MRN: [id number] Visit Date: 08/21/2020  Today's healthcare provider: Wilhemena Durie, MD   Chief Complaint  Patient presents with   Diabetes   Hypertension   Shoulder Injury   Subjective    HPI  Overall patient is doing well but 2 months ago he fell and injured his shoulder and has a rotator cuff injury.  He cannot sleep at night due to this pain.  Otherwise he is able to function just fine.  He has tried Tylenol and anti-inflammatories. Diabetes Mellitus Type II, follow-up  Lab Results  Component Value Date   HGBA1C 7.0 (A) 08/21/2020   HGBA1C 9.1 (H) 04/04/2020   HGBA1C 7.1 (H) 09/22/2019   Last seen for diabetes 5 months ago.  Management since then includes; added Jardiance 10 mg qd. He reports good compliance with treatment. He is not having side effects.   Home blood sugar records: trend: stable  Episodes of hypoglycemia? No    Current insulin regiment: none Most Recent Eye Exam: up to date  Hypertension, follow-up  BP Readings from Last 3 Encounters:  08/21/20 132/64  04/04/20 (!) 144/62  03/03/20 126/66   Wt Readings from Last 3 Encounters:  08/21/20 169 lb (76.7 kg)  04/04/20 174 lb (78.9 kg)  03/03/20 175 lb 12.8 oz (79.7 kg)     He was last seen for hypertension 5 months ago.  BP at that visit was 144/62. Management since that visit includes; on losartan and metoprolol. He reports good compliance with treatment. He is not having side effects.  He is not exercising. He is adherent to low salt diet.   Outside blood pressures are not being checked.  He does not smoke.  Use of agents associated with hypertension: none.        Medications: Outpatient Medications Prior to Visit  Medication Sig   allopurinol (ZYLOPRIM) 300 MG tablet TAKE TWO TABLETS EVERY DAY   Cholecalciferol (VITAMIN D3) 2000 UNITS TABS Take 1 tablet by mouth daily.   CINNAMON PO  Take 2,000 mg by mouth daily.   empagliflozin (JARDIANCE) 10 MG TABS tablet Take 1 tablet (10 mg total) by mouth daily.   losartan (COZAAR) 25 MG tablet TAKE 1 TABLET BY MOUTH DAILY   metFORMIN (GLUCOPHAGE-XR) 500 MG 24 hr tablet TAKE TWO TABLETS TWICE DAILY   metoprolol tartrate (LOPRESSOR) 50 MG tablet Take one-half tablet (25 mg) by mouth twice daily as needed for palpitations and/or fast heart rates   Omega-3 Fatty Acids (FISH OIL) 1000 MG CAPS Take 1 capsule by mouth daily.   ONETOUCH VERIO test strip CHECK BLOOD SUGAR DAILY   rivaroxaban (XARELTO) 20 MG TABS tablet Take 1 tablet by mouth daily with supper   rosuvastatin (CRESTOR) 20 MG tablet TAKE ONE TABLET BY MOUTH EVERY DAY (DOSAGE INCREASE )   sildenafil (REVATIO) 20 MG tablet TAKE 1 TABLET 3 TIMES A DAY AS NEEDED   TURMERIC PO Take by mouth daily. Dose unknown   vitamin B-12 (CYANOCOBALAMIN) 1000 MCG tablet Take 1,000 mcg by mouth daily.   zinc gluconate 50 MG tablet Take 50 mg by mouth daily.   rosuvastatin (CRESTOR) 10 MG tablet TAKE 1 TABLET BY MOUTH DAILY   No facility-administered medications prior to visit.    Review of Systems  Constitutional:  Negative for appetite change, chills and fever.  Respiratory:  Negative for chest tightness, shortness of  breath and wheezing.   Cardiovascular:  Negative for chest pain and palpitations.  Gastrointestinal:  Negative for abdominal pain, nausea and vomiting.       Objective    BP 132/64   Pulse (!) 58   Temp 97.7 F (36.5 C)   Resp 16   Ht '5\' 9"'  (1.753 m)   Wt 169 lb (76.7 kg)   BMI 24.96 kg/m  BP Readings from Last 3 Encounters:  08/21/20 132/64  04/04/20 (!) 144/62  03/03/20 126/66   Wt Readings from Last 3 Encounters:  08/21/20 169 lb (76.7 kg)  04/04/20 174 lb (78.9 kg)  03/03/20 175 lb 12.8 oz (79.7 kg)       Physical Exam Vitals reviewed.  Constitutional:      Appearance: He is well-developed.  HENT:     Head: Normocephalic and atraumatic.     Right  Ear: External ear normal.     Left Ear: External ear normal.     Nose: Nose normal.  Eyes:     Conjunctiva/sclera: Conjunctivae normal.  Neck:     Thyroid: No thyromegaly.  Cardiovascular:     Rate and Rhythm: Normal rate and regular rhythm.     Heart sounds: Normal heart sounds.  Pulmonary:     Effort: Pulmonary effort is normal.     Breath sounds: Normal breath sounds.  Abdominal:     Palpations: Abdomen is soft.  Musculoskeletal:     Comments: Some pain with abduction of right shoulder.  Skin:    General: Skin is warm and dry.  Neurological:     General: No focal deficit present.     Mental Status: He is alert and oriented to person, place, and time.  Psychiatric:        Mood and Affect: Mood normal.        Behavior: Behavior normal.        Thought Content: Thought content normal.        Judgment: Judgment normal.      Results for orders placed or performed in visit on 08/21/20  POCT glycosylated hemoglobin (Hb A1C)  Result Value Ref Range   Hemoglobin A1C 7.0 (A) 4.0 - 5.6 %   HbA1c POC (<> result, manual entry)     HbA1c, POC (prediabetic range)     HbA1c, POC (controlled diabetic range)      Assessment & Plan     1. Type 2 diabetes mellitus without complication, without long-term current use of insulin (HCC) A1c very good at 7.0 today.  Continue Jardiance and metformin - POCT glycosylated hemoglobin (Hb A1C)  2. Primary hypertension On metoprolol and losartan  3. Injury of right rotator cuff, initial encounter Try tramadol 1-2 at bedtime and see how he does with this.  Anti-inflammatories contraindicated due to Xarelto for A. fib - traMADol (ULTRAM) 50 MG tablet; Take 1-2 tablets by mouth at bedtime.  Dispense: 60 tablet; Refill: 3  4. Chronic atrial fibrillation (HCC) Xarelto and met Toprol all  5. Hyperlipidemia, unspecified hyperlipidemia type On rosuvastatin  6. Gout, unspecified cause, unspecified chronicity, unspecified site On  allopurinol   No follow-ups on file.      I, Wilhemena Durie, MD, have reviewed all documentation for this visit. The documentation on 08/25/20 for the exam, diagnosis, procedures, and orders are all accurate and complete.    Alexandra Lipps Cranford Mon, MD  [facility] [phone] (phone) [phone] (fax)  [place]

## 2020-08-22 ENCOUNTER — Telehealth: Payer: Self-pay | Admitting: Family Medicine

## 2020-08-22 MED ORDER — TRAMADOL HCL 50 MG PO TABS: ORAL_TABLET | ORAL | 3 refills | Status: DC

## 2020-08-22 NOTE — Telephone Encounter (Signed)
Pt seen dr Rosanna Randy yesterday and per patient dr Rosanna Randy was going to call in tramadol for shoulder pain to [facility] in [place] phone number [phone]

## 2020-08-31 ENCOUNTER — Other Ambulatory Visit: Payer: Self-pay | Admitting: Family Medicine

## 2020-08-31 DIAGNOSIS — E1121 Type 2 diabetes mellitus with diabetic nephropathy: Secondary | ICD-10-CM

## 2020-09-11 ENCOUNTER — Encounter: Payer: PPO | Admitting: Dermatology

## 2020-09-18 ENCOUNTER — Other Ambulatory Visit: Payer: Self-pay

## 2020-09-18 ENCOUNTER — Encounter: Payer: Self-pay | Admitting: Dermatology

## 2020-09-18 ENCOUNTER — Ambulatory Visit (INDEPENDENT_AMBULATORY_CARE_PROVIDER_SITE_OTHER): Payer: PPO | Admitting: Dermatology

## 2020-09-18 DIAGNOSIS — L570 Actinic keratosis: Secondary | ICD-10-CM | POA: Diagnosis not present

## 2020-09-18 DIAGNOSIS — Z85828 Personal history of other malignant neoplasm of skin: Secondary | ICD-10-CM

## 2020-09-18 DIAGNOSIS — L814 Other melanin hyperpigmentation: Secondary | ICD-10-CM

## 2020-09-18 DIAGNOSIS — Z1283 Encounter for screening for malignant neoplasm of skin: Secondary | ICD-10-CM

## 2020-09-18 DIAGNOSIS — D229 Melanocytic nevi, unspecified: Secondary | ICD-10-CM

## 2020-09-18 DIAGNOSIS — L820 Inflamed seborrheic keratosis: Secondary | ICD-10-CM

## 2020-09-18 DIAGNOSIS — L821 Other seborrheic keratosis: Secondary | ICD-10-CM

## 2020-09-18 DIAGNOSIS — D1800 Hemangioma unspecified site: Secondary | ICD-10-CM | POA: Diagnosis not present

## 2020-09-18 DIAGNOSIS — L578 Other skin changes due to chronic exposure to nonionizing radiation: Secondary | ICD-10-CM | POA: Diagnosis not present

## 2020-09-18 NOTE — Patient Instructions (Signed)

## 2020-09-18 NOTE — Progress Notes (Signed)
Follow-Up Visit   Subjective  Benjamin [name] is a 70 y.o. male who presents for the following: Total body skin exam (Hx of SCC R lat neck, hx of AKs). The patient presents for Total-Body Skin Exam (TBSE) for skin cancer screening and mole check.  The following portions of the chart were reviewed this encounter and updated as appropriate:   Tobacco  Allergies  Meds  Problems  Med Hx  Surg Hx  Fam Hx     Review of Systems:  No other skin or systemic complaints except as noted in HPI or Assessment and Plan.  Objective  Well appearing patient in no apparent distress; mood and affect are within normal limits.  A full examination was performed including scalp, head, eyes, ears, nose, lips, neck, chest, axillae, abdomen, back, buttocks, bilateral upper extremities, bilateral lower extremities, hands, feet, fingers, toes, fingernails, and toenails. All findings within normal limits unless otherwise noted below.  R lat neck Well healed scar with no evidence of recurrence, no lymphadenopathy.   face x 5 (5) Pink scaly macules   R cheek x 2, L forearm x 4, Total = 6 (6), Right Lower Eyelid margin x 1, Total = 1 Erythematous keratotic or waxy stuck-on papule or plaque.    Assessment & Plan   Lentigines - Scattered tan macules - Due to sun exposure - Benign-appering, observe - Recommend daily broad spectrum sunscreen SPF 30+ to sun-exposed areas, reapply every 2 hours as needed. - Call for any changes  Seborrheic Keratoses - Stuck-on, waxy, tan-brown papules and/or plaques  - Benign-appearing - Discussed benign etiology and prognosis. - Observe - Call for any changes  Melanocytic Nevi - Tan-brown and/or pink-flesh-colored symmetric macules and papules - Benign appearing on exam today - Observation - Call clinic for new or changing moles - Recommend daily use of broad spectrum spf 30+ sunscreen to sun-exposed areas.   Hemangiomas - Red papules - Discussed benign  nature - Observe - Call for any changes  Actinic Damage - Chronic condition, secondary to cumulative UV/sun exposure - diffuse scaly erythematous macules with underlying dyspigmentation - Recommend daily broad spectrum sunscreen SPF 30+ to sun-exposed areas, reapply every 2 hours as needed.  - Staying in the shade or wearing long sleeves, sun glasses (UVA+UVB protection) and wide brim hats (4-inch brim around the entire circumference of the hat) are also recommended for sun protection.  - Call for new or changing lesions.  Skin cancer screening performed today.  History of SCC (squamous cell carcinoma) of skin R lat neck  Clear. Observe for recurrence. Call clinic for new or changing lesions.  Recommend regular skin exams, daily broad-spectrum spf 30+ sunscreen use, and photoprotection.    AK (actinic keratosis) (5) face x 5  Destruction of lesion - face x 5 Complexity: simple   Destruction method: cryotherapy   Informed consent: discussed and consent obtained   Timeout:  patient name, date of birth, surgical site, and procedure verified Lesion destroyed using liquid nitrogen: Yes   Region frozen until ice ball extended beyond lesion: Yes   Outcome: patient tolerated procedure well with no complications   Post-procedure details: wound care instructions given    Inflamed seborrheic keratosis R cheek x 2, L forearm x 4, Total = 6 (6); Right Lower Eyelid margin x 1, Total = 1  Destruction of lesion - R cheek x 2, L forearm x 4, Total = 6, Right Lower Eyelid margin x 1, Total = 1 Complexity: simple  Destruction method: cryotherapy   Informed consent: discussed and consent obtained   Timeout:  patient name, date of birth, surgical site, and procedure verified Lesion destroyed using liquid nitrogen: Yes   Region frozen until ice ball extended beyond lesion: Yes   Outcome: patient tolerated procedure well with no complications   Post-procedure details: wound care instructions  given    Skin cancer screening  Return in about 1 year (around 09/18/2021) for TBSE, Hx of SCC, Hx of AKs.  I, Othelia Pulling, RMA, am acting as scribe for Sarina Ser, MD . Documentation: I have reviewed the above documentation for accuracy and completeness, and I agree with the above.  Sarina Ser, MD

## 2020-10-09 ENCOUNTER — Other Ambulatory Visit: Payer: Self-pay | Admitting: Family Medicine

## 2020-10-09 DIAGNOSIS — E119 Type 2 diabetes mellitus without complications: Secondary | ICD-10-CM

## 2020-10-26 ENCOUNTER — Other Ambulatory Visit: Payer: Self-pay

## 2020-10-26 ENCOUNTER — Encounter: Payer: Self-pay | Admitting: Family Medicine

## 2020-10-26 ENCOUNTER — Ambulatory Visit (INDEPENDENT_AMBULATORY_CARE_PROVIDER_SITE_OTHER): Payer: PPO | Admitting: Family Medicine

## 2020-10-26 VITALS — BP 112/73 | HR 85 | Temp 98.4°F | Resp 16 | Ht 69.0 in | Wt 171.0 lb

## 2020-10-26 DIAGNOSIS — I10 Essential (primary) hypertension: Secondary | ICD-10-CM

## 2020-10-26 DIAGNOSIS — I482 Chronic atrial fibrillation, unspecified: Secondary | ICD-10-CM | POA: Diagnosis not present

## 2020-10-26 DIAGNOSIS — E119 Type 2 diabetes mellitus without complications: Secondary | ICD-10-CM

## 2020-10-26 DIAGNOSIS — E785 Hyperlipidemia, unspecified: Secondary | ICD-10-CM

## 2020-10-26 DIAGNOSIS — Z23 Encounter for immunization: Secondary | ICD-10-CM | POA: Diagnosis not present

## 2020-10-26 DIAGNOSIS — S46001A Unspecified injury of muscle(s) and tendon(s) of the rotator cuff of right shoulder, initial encounter: Secondary | ICD-10-CM | POA: Diagnosis not present

## 2020-10-26 DIAGNOSIS — G4700 Insomnia, unspecified: Secondary | ICD-10-CM

## 2020-10-26 MED ORDER — TRAZODONE HCL 100 MG PO TABS: 100.0000 mg | ORAL_TABLET | Freq: Every evening | ORAL | 5 refills | Status: DC | PRN

## 2020-10-26 NOTE — Progress Notes (Signed)
I,April Miller,acting as a scribe for Wilhemena Durie, MD.,have documented all relevant documentation on the behalf of Wilhemena Durie, MD,as directed by  Wilhemena Durie, MD while in the presence of Wilhemena Durie, MD.  Established patient visit   Patient: Benjamin [name]   DOB: 09-11-1950   70 y.o. Male  MRN: [id number] Visit Date: 10/26/2020  Today's healthcare provider: Wilhemena Durie, MD   Chief Complaint  Patient presents with   Follow-up   Subjective    HPI  Patient scheduled for right rotator cuff repair in November.  Because of pain he is unable to sleep well at night and is taking some of his wife's trazodone which is helped.  He states it actually helped a lot more than the tramadol did. Other than his shoulder pain he feels well and has no complaints.  Medications are stable and he is taking them all as directed. Follow up for Injury of right rotator cuff, initial encounter  The patient was last seen for this 2 months ago. Changes made at last visit include; Try tramadol 1-2 at bedtime and see how he does with this.  Anti-inflammatories contraindicated due to Xarelto for A. Fib.  He reports good compliance with treatment. He feels that condition is Improved.     Medications: Outpatient Medications Prior to Visit  Medication Sig   allopurinol (ZYLOPRIM) 300 MG tablet TAKE TWO TABLETS EVERY DAY   Cholecalciferol (VITAMIN D3) 2000 UNITS TABS Take 1 tablet by mouth daily.   CINNAMON PO Take 2,000 mg by mouth daily.   JARDIANCE 10 MG TABS tablet TAKE 1 TABLET BY MOUTH DAILY   losartan (COZAAR) 25 MG tablet TAKE 1 TABLET BY MOUTH DAILY   metFORMIN (GLUCOPHAGE-XR) 500 MG 24 hr tablet TAKE TWO TABLETS TWICE DAILY   metoprolol tartrate (LOPRESSOR) 50 MG tablet Take one-half tablet (25 mg) by mouth twice daily as needed for palpitations and/or fast heart rates   Omega-3 Fatty Acids (FISH OIL) 1000 MG CAPS Take 1 capsule by mouth daily.   ONETOUCH  VERIO test strip CHECK BLOOD SUGAR DAILY   rivaroxaban (XARELTO) 20 MG TABS tablet Take 1 tablet by mouth daily with supper   rosuvastatin (CRESTOR) 20 MG tablet TAKE ONE TABLET BY MOUTH EVERY DAY (DOSAGE INCREASE )   sildenafil (REVATIO) 20 MG tablet TAKE 1 TABLET 3 TIMES A DAY AS NEEDED   TURMERIC PO Take by mouth daily. Dose unknown   vitamin B-12 (CYANOCOBALAMIN) 1000 MCG tablet Take 1,000 mcg by mouth daily.   zinc gluconate 50 MG tablet Take 50 mg by mouth daily.   traMADol (ULTRAM) 50 MG tablet Take 1-2 tablets by mouth at bedtime. (Patient not taking: Reported on 10/26/2020)   [DISCONTINUED] rosuvastatin (CRESTOR) 10 MG tablet TAKE 1 TABLET BY MOUTH DAILY   No facility-administered medications prior to visit.    Review of Systems  Constitutional:  Negative for appetite change, chills and fever.  Respiratory:  Negative for chest tightness, shortness of breath and wheezing.   Cardiovascular:  Negative for chest pain and palpitations.  Gastrointestinal:  Negative for abdominal pain, nausea and vomiting.       Objective    BP 112/73   Pulse 85   Temp 98.4 F (36.9 C)   Resp 16   Ht '5\' 9"'$  (1.753 m)   Wt 171 lb (77.6 kg)   BMI 25.25 kg/m  BP Readings from Last 3 Encounters:  10/26/20 112/73  08/21/20 132/64  04/04/20 (!) 144/62   Wt Readings from Last 3 Encounters:  10/26/20 171 lb (77.6 kg)  08/21/20 169 lb (76.7 kg)  04/04/20 174 lb (78.9 kg)      Physical Exam Vitals reviewed.  Constitutional:      Appearance: He is well-developed.  HENT:     Head: Normocephalic and atraumatic.     Right Ear: External ear normal.     Left Ear: External ear normal.     Nose: Nose normal.  Eyes:     Conjunctiva/sclera: Conjunctivae normal.  Neck:     Thyroid: No thyromegaly.  Cardiovascular:     Rate and Rhythm: Normal rate and regular rhythm.     Heart sounds: Normal heart sounds.  Pulmonary:     Effort: Pulmonary effort is normal.     Breath sounds: Normal breath  sounds.  Abdominal:     Palpations: Abdomen is soft.  Musculoskeletal:     Comments: Some pain with abduction of right shoulder.  Skin:    General: Skin is warm and dry.  Neurological:     General: No focal deficit present.     Mental Status: He is alert and oriented to person, place, and time.  Psychiatric:        Mood and Affect: Mood normal.        Behavior: Behavior normal.        Thought Content: Thought content normal.        Judgment: Judgment normal.      No results found for any visits on 10/26/20.  Assessment & Plan     1. Type 2 diabetes mellitus without complication, without long-term current use of insulin (HCC) Goal A1c of 7.  Last was 7.1. Well-controlled on Jardiance 10 and metformin. Consider continue glucose monitor if A1c jumps out of control. 2. Need for influenza vaccination  - Flu Vaccine QUAD High Dose(Fluad)  3. Need for pneumococcal vaccination Pneumococcal 20 should be his last pneumonia shot - Pneumococcal conjugate vaccine 20-valent (Prevnar 20)  4. Injury of right rotator cuff, initial encounter For shoulder surgery in November.  5. Insomnia, unspecified type Have not pain in his wife's trazodone 100 mg helps him sleep.  I think this is a safer safer than anything else I can give him for pain. - traZODone (DESYREL) 100 MG tablet; Take 1 tablet (100 mg total) by mouth at bedtime as needed for sleep.  Dispense: 30 tablet; Refill: 5  6. Primary hypertension Good control on metoprolol losartan  7. Hyperlipidemia, unspecified hyperlipidemia type On Crestor 20  8. Chronic atrial fibrillation (HCC) On Xarelto 20   No follow-ups on file.      I, Wilhemena Durie, MD, have reviewed all documentation for this visit. The documentation on 10/26/20 for the exam, diagnosis, procedures, and orders are all accurate and complete.    Shakeila Pfarr Cranford Mon, MD  [facility] [phone] (phone) [phone] (fax)  [place]

## 2020-11-06 ENCOUNTER — Telehealth: Payer: Self-pay | Admitting: *Deleted

## 2020-11-06 ENCOUNTER — Other Ambulatory Visit: Payer: Self-pay | Admitting: Family Medicine

## 2020-11-06 DIAGNOSIS — M180 Bilateral primary osteoarthritis of first carpometacarpal joints: Secondary | ICD-10-CM | POA: Diagnosis not present

## 2020-11-06 DIAGNOSIS — M7501 Adhesive capsulitis of right shoulder: Secondary | ICD-10-CM | POA: Diagnosis not present

## 2020-11-06 DIAGNOSIS — E118 Type 2 diabetes mellitus with unspecified complications: Secondary | ICD-10-CM

## 2020-11-06 NOTE — Telephone Encounter (Signed)
   [facility] Pre-operative Risk Assessment    Patient Name: Benjamin [name]  DOB: [birth year] MRN: [id number]  HEARTCARE STAFF:  - IMPORTANT!!!!!! Under Visit Info/Reason for Call, type in Other and utilize the format Clearance MM/DD/YY or Clearance TBD. Do not use dashes or single digits. - Please review there is not already an duplicate clearance open for this procedure. - If request is for dental extraction, please clarify the # of teeth to be extracted. - If the patient is currently at the dentist's office, call Pre-Op Callback Staff (MA/nurse) to input urgent request.  - If the patient is not currently in the dentist office, please route to the Pre-Op pool.  Request for surgical clearance:  What type of surgery is being performed? RIGHT SHOULDER SCOPE W/RCR  When is this surgery scheduled? 01/02/21  What type of clearance is required (medical clearance vs. Pharmacy clearance to hold med vs. Both)? BOTH  Are there any medications that need to be held prior to surgery and how long?  [facility]  Practice name and name of physician performing surgery? [facility]; DR. Lennette Bihari SUPPLE  What is the office phone number? [phone]   7.   What is the office fax number? [phone] ATTN: [place]  8.   Anesthesia type (None, local, MAC, general) ? GENERAL   Julaine Hua 11/06/2020, 6:08 PM  _________________________________________________________________   (provider comments below)

## 2020-11-08 NOTE — Telephone Encounter (Signed)
Patient with diagnosis of afib on Xarelto for anticoagulation.    Procedure: right shoulder scope w/ RCR Date of procedure: 01/02/21  CHA2DS2-VASc Score = 3  This indicates a 3.2% annual risk of stroke. The patient's score is based upon: CHF History: 0 HTN History: 1 Diabetes History: 1 Stroke History: 0 Vascular Disease History: 0 Age Score: 1 Gender Score: 0   CrCl 44mL/min Platelet count 282K  Per office protocol, patient can hold Xarelto for 3 days prior to procedure.

## 2020-11-09 NOTE — Telephone Encounter (Signed)
    Patient Name: Benjamin [name]  DOB: [birth year] MRN: [id number]  Primary Cardiologist: Thompson Grayer, MD  Chart reviewed as part of pre-operative protocol coverage. Given past medical history and time since last visit, based on ACC/AHA guidelines, JERIMIE MANCUSO would be at acceptable risk for the planned procedure without further cardiovascular testing.   The patient was advised that if he develops new symptoms prior to surgery to contact our office to arrange for a follow-up visit, and he verbalized understanding.  I will route this recommendation to the requesting party via Epic fax function and remove from pre-op pool.  Please call with questions.  [place], [place] 11/09/2020, 8:54 AM

## 2020-11-27 ENCOUNTER — Other Ambulatory Visit: Payer: Self-pay | Admitting: Family Medicine

## 2020-11-27 DIAGNOSIS — E1121 Type 2 diabetes mellitus with diabetic nephropathy: Secondary | ICD-10-CM

## 2020-12-12 HISTORY — PX: SHOULDER SURGERY: SHX246

## 2021-01-02 DIAGNOSIS — I10 Essential (primary) hypertension: Secondary | ICD-10-CM | POA: Diagnosis not present

## 2021-01-02 DIAGNOSIS — M7501 Adhesive capsulitis of right shoulder: Secondary | ICD-10-CM | POA: Diagnosis not present

## 2021-01-02 DIAGNOSIS — M19011 Primary osteoarthritis, right shoulder: Secondary | ICD-10-CM | POA: Diagnosis not present

## 2021-01-02 DIAGNOSIS — I890 Lymphedema, not elsewhere classified: Secondary | ICD-10-CM | POA: Diagnosis not present

## 2021-01-02 DIAGNOSIS — Z4889 Encounter for other specified surgical aftercare: Secondary | ICD-10-CM | POA: Diagnosis not present

## 2021-01-02 DIAGNOSIS — E118 Type 2 diabetes mellitus with unspecified complications: Secondary | ICD-10-CM | POA: Diagnosis not present

## 2021-01-02 DIAGNOSIS — Y999 Unspecified external cause status: Secondary | ICD-10-CM | POA: Diagnosis not present

## 2021-01-02 DIAGNOSIS — M75101 Unspecified rotator cuff tear or rupture of right shoulder, not specified as traumatic: Secondary | ICD-10-CM | POA: Diagnosis not present

## 2021-01-02 DIAGNOSIS — G8918 Other acute postprocedural pain: Secondary | ICD-10-CM | POA: Diagnosis not present

## 2021-01-02 DIAGNOSIS — M25511 Pain in right shoulder: Secondary | ICD-10-CM | POA: Diagnosis not present

## 2021-01-02 DIAGNOSIS — E785 Hyperlipidemia, unspecified: Secondary | ICD-10-CM | POA: Diagnosis not present

## 2021-01-02 DIAGNOSIS — M7521 Bicipital tendinitis, right shoulder: Secondary | ICD-10-CM | POA: Diagnosis not present

## 2021-01-02 DIAGNOSIS — X58XXXA Exposure to other specified factors, initial encounter: Secondary | ICD-10-CM | POA: Diagnosis not present

## 2021-01-02 DIAGNOSIS — M7541 Impingement syndrome of right shoulder: Secondary | ICD-10-CM | POA: Diagnosis not present

## 2021-01-02 DIAGNOSIS — M75121 Complete rotator cuff tear or rupture of right shoulder, not specified as traumatic: Secondary | ICD-10-CM | POA: Diagnosis not present

## 2021-01-02 DIAGNOSIS — S43431A Superior glenoid labrum lesion of right shoulder, initial encounter: Secondary | ICD-10-CM | POA: Diagnosis not present

## 2021-01-02 DIAGNOSIS — M659 Synovitis and tenosynovitis, unspecified: Secondary | ICD-10-CM | POA: Diagnosis not present

## 2021-01-02 DIAGNOSIS — M67813 Other specified disorders of tendon, right shoulder: Secondary | ICD-10-CM | POA: Diagnosis not present

## 2021-01-02 DIAGNOSIS — S46011A Strain of muscle(s) and tendon(s) of the rotator cuff of right shoulder, initial encounter: Secondary | ICD-10-CM | POA: Diagnosis not present

## 2021-01-03 ENCOUNTER — Other Ambulatory Visit: Payer: Self-pay | Admitting: Family Medicine

## 2021-01-03 DIAGNOSIS — E119 Type 2 diabetes mellitus without complications: Secondary | ICD-10-CM

## 2021-01-03 DIAGNOSIS — E785 Hyperlipidemia, unspecified: Secondary | ICD-10-CM

## 2021-01-10 ENCOUNTER — Telehealth: Payer: PPO | Admitting: Physician Assistant

## 2021-01-10 ENCOUNTER — Encounter: Payer: Self-pay | Admitting: Family Medicine

## 2021-01-10 ENCOUNTER — Ambulatory Visit: Payer: Self-pay

## 2021-01-10 DIAGNOSIS — J069 Acute upper respiratory infection, unspecified: Secondary | ICD-10-CM | POA: Diagnosis not present

## 2021-01-10 MED ORDER — DOXYCYCLINE HYCLATE 100 MG PO TABS: 100.0000 mg | ORAL_TABLET | Freq: Two times a day (BID) | ORAL | 0 refills | Status: DC

## 2021-01-10 MED ORDER — BENZONATATE 100 MG PO CAPS: 100.0000 mg | ORAL_CAPSULE | Freq: Three times a day (TID) | ORAL | 0 refills | Status: DC | PRN

## 2021-01-10 MED ORDER — FLUTICASONE PROPIONATE 50 MCG/ACT NA SUSP: 2.0000 | Freq: Every day | NASAL | 0 refills | Status: DC

## 2021-01-10 NOTE — Telephone Encounter (Signed)
The patient has experienced cold and flu like symptoms since 01/07/21   The patient is experiencing a runny nose, sore throat and cough   The patient would like to be prescribed something to help with their symptoms   Please contact further when possible   Pt. States Dr. Rosanna Randy messaged him through My Chart that he does not need antibiotic at this time. Pt. States "if I feel worse I'll call back for an appointment." Instructed to call back if symptoms worsen.   Answer Assessment - Initial Assessment Questions 1. ONSET: "When did the nasal discharge start?"      01/07/21 2. AMOUNT: "How much discharge is there?"      Moderate 3. COUGH: "Do you have a cough?" If yes, ask: "Describe the color of your sputum" (clear, white, yellow, green)     Yes 4. RESPIRATORY DISTRESS: "Describe your breathing."      No 5. FEVER: "Do you have a fever?" If Yes, ask: "What is your temperature, how was it measured, and when did it start?"     No 6. SEVERITY: "Overall, how bad are you feeling right now?" (e.g., doesn't interfere with normal activities, staying home from school/work, staying in bed)      Staying home 7. OTHER SYMPTOMS: "Do you have any other symptoms?" (e.g., sore throat, earache, wheezing, vomiting)     Sore throat 8. PREGNANCY: "Is there any chance you are pregnant?" "When was your last menstrual period?"     N/a  Protocols used: Common Cold-A-AH

## 2021-01-10 NOTE — Patient Instructions (Signed)
Benjamin [name], thank you for joining Leeanne Rio, PA-C for today's virtual visit.  While this provider is not your primary care provider (PCP), if your PCP is located in our provider database this encounter information will be shared with them immediately following your visit.  Consent: (Patient) Benjamin [name] provided verbal consent for this virtual visit at the beginning of the encounter.  Current Medications:  Current Outpatient Medications:    allopurinol (ZYLOPRIM) 300 MG tablet, TAKE TWO TABLETS EVERY DAY, Disp: 180 tablet, Rfl: 0   Cholecalciferol (VITAMIN D3) 2000 UNITS TABS, Take 1 tablet by mouth daily., Disp: , Rfl:    CINNAMON PO, Take 2,000 mg by mouth daily., Disp: , Rfl:    JARDIANCE 10 MG TABS tablet, TAKE 1 TABLET BY MOUTH DAILY, Disp: 90 tablet, Rfl: 3   losartan (COZAAR) 25 MG tablet, TAKE 1 TABLET BY MOUTH DAILY, Disp: 90 tablet, Rfl: 3   metFORMIN (GLUCOPHAGE-XR) 500 MG 24 hr tablet, TAKE TWO TABLETS TWICE DAILY, Disp: 360 tablet, Rfl: 1   metoprolol tartrate (LOPRESSOR) 50 MG tablet, Take one-half tablet (25 mg) by mouth twice daily as needed for palpitations and/or fast heart rates, Disp: 30 tablet, Rfl: 3   Omega-3 Fatty Acids (FISH OIL) 1000 MG CAPS, Take 1 capsule by mouth daily., Disp: , Rfl:    ONETOUCH VERIO test strip, CHECK BLOOD SUGAR DAILY, Disp: 50 each, Rfl: 11   rivaroxaban (XARELTO) 20 MG TABS tablet, Take 1 tablet by mouth daily with supper, Disp: 90 tablet, Rfl: 1   rosuvastatin (CRESTOR) 20 MG tablet, TAKE 1 TABLET BY MOUTH EVERY DAY., Disp: 90 tablet, Rfl: 1   sildenafil (REVATIO) 20 MG tablet, TAKE 1 TABLET 3 TIMES A DAY AS NEEDED, Disp: 50 tablet, Rfl: 3   traZODone (DESYREL) 100 MG tablet, Take 1 tablet (100 mg total) by mouth at bedtime as needed for sleep., Disp: 30 tablet, Rfl: 5   TURMERIC PO, Take by mouth daily. Dose unknown, Disp: , Rfl:    vitamin B-12 (CYANOCOBALAMIN) 1000 MCG tablet, Take 1,000 mcg by mouth daily., Disp: ,  Rfl:    zinc gluconate 50 MG tablet, Take 50 mg by mouth daily., Disp: , Rfl:    Medications ordered in this encounter:  No orders of the defined types were placed in this encounter.    *If you need refills on other medications prior to your next appointment, please contact your pharmacy*  Follow-Up: Call back or seek an in-person evaluation if the symptoms worsen or if the condition fails to improve as anticipated.  Other Instructions Please keep well-hydrated and get plenty of rest.  Continue your regular allergy medication regimen. Start the a saline nasal rinse 2 x daily. Use the Flonase once daily, after use of the saline. If you have a humidifier, place it in the bedroom and run at night.  You can use plain Mucinex OTC to help thin congestion. Tylenol and ibuprofen if needed for fever and sore throat. Salt-water gargles and chloraseptic spray can also be beneficial. Warm tea with lemon and honey are also soothing for this.  I have sent in a script for Tessalon to help with cough.  Again I agree with your PCP that this seems most likely viral. You are to take a home COVID test as directed and report any positive results to [place] or your PCP so treatment can be adjusted.   If negative and symptoms are not improving over next 48-72 hours (this does not mean  resolved, but symptoms should be improving at this point), or anything worsening, you can start the antibiotic and take as directed.   If you have been instructed to have an in-person evaluation today at a local Urgent Care facility, please use the link below. It will take you to a list of all of our available [facility], including address, phone number and hours of operation. Please do not delay care.  [facility]  If you or a family member do not have a primary care provider, use the link below to schedule a visit and establish care. When you choose a [facility] primary care physician or advanced  practice provider, you gain a long-term partner in health. Find a Primary Care Provider  Learn more about [facility]'s in-office and virtual care options: [facility]

## 2021-01-10 NOTE — Telephone Encounter (Signed)
Reason for Disposition . [1] Fever > 101 F (38.3 C) AND [2] age > 60 years  Answer Assessment - Initial Assessment Questions 1. TEMPERATURE: "What is the most recent temperature?"  "How was it measured?"      101.5 2. ONSET: "When did the fever start?"      A few hours ago  3. CHILLS: "Do you have chills?" If yes: "How bad are they?"  (e.g., none, mild, moderate, severe)   - NONE: no chills   - MILD: feeling cold   - MODERATE: feeling very cold, some shivering (feels better under a thick blanket)   - SEVERE: feeling extremely cold with shaking chills (general body shaking, rigors; even under a thick blanket)      no 4. OTHER SYMPTOMS: "Do you have any other symptoms besides the fever?"  (e.g., abdomen pain, cough, diarrhea, earache, headache, sore throat, urination pain)     Cough, runny nose, sore throat 5. CAUSE: If there are no symptoms, ask: "What do you think is causing the fever?"      Surgery a week ago  6. CONTACTS: "Does anyone else in the family have an infection?"     No 7. TREATMENT: "What have you done so far to treat this fever?" (e.g., medications)     Taken over the OTC 8. IMMUNOCOMPROMISE: "Do you have of the following: diabetes, HIV positive, splenectomy, cancer chemotherapy, chronic steroid treatment, transplant patient, etc."     no 9. PREGNANCY: "Is there any chance you are pregnant?" "When was your last menstrual period?"     NA 10. TRAVEL: "Have you traveled out of the country in the last month?" (e.g., travel history, exposures)       No  Protocols used: [facility]

## 2021-01-10 NOTE — Telephone Encounter (Signed)
Pt's wife called back in to state that pt has fever of 101.5 that started a few hrs ago. She is concerned that everything is progressing quickly and pt just had surgery on his arm a week ago. Advised wife that appt in office may not be soon enough and could do virtual UC visit. She wanted instructions on how to do that. Walked her step by step setting up an appt. Pt has virt. UC appt at 1800. Care advice given and pt verbalized understanding. No other questions/concerns noted.

## 2021-01-10 NOTE — Progress Notes (Signed)
Virtual Visit Consent   Benjamin [name], you are scheduled for a virtual visit with a [facility] provider today.     Just as with appointments in the office, your consent must be obtained to participate.  Your consent will be active for this visit and any virtual visit you may have with one of our providers in the next 365 days.     If you have a MyChart account, a copy of this consent can be sent to you electronically.  All virtual visits are billed to your insurance company just like a traditional visit in the office.    As this is a virtual visit, video technology does not allow for your provider to perform a traditional examination.  This may limit your provider's ability to fully assess your condition.  If your provider identifies any concerns that need to be evaluated in person or the need to arrange testing (such as labs, EKG, etc.), we will make arrangements to do so.     Although advances in technology are sophisticated, we cannot ensure that it will always work on either your end or our end.  If the connection with a video visit is poor, the visit may have to be switched to a telephone visit.  With either a video or telephone visit, we are not always able to ensure that we have a secure connection.     I need to obtain your verbal consent now.   Are you willing to proceed with your visit today?    Benjamin [name] has provided verbal consent on 01/10/2021 for a virtual visit (video or telephone).   Benjamin [name], [place]   Date: 01/10/2021 6:19 PM   Virtual Visit via Video Note   I, Benjamin [name], connected with  Benjamin [name]  ([id number], Oct 12, 1950) on 01/10/21 at  6:00 PM EST by a video-enabled telemedicine application and verified that I am speaking with the correct person using two identifiers.  Location: Patient: Virtual Visit Location Patient: Home Provider: Virtual Visit Location Provider: Home Office   I discussed the limitations of evaluation and  management by telemedicine and the availability of in person appointments. The patient expressed understanding and agreed to proceed.    History of Present Illness: Benjamin [name] is a 70 y.o. who identifies as a male who was assigned male at birth, and is being seen today for URI symptoms starting Monday of this week. Notes a little over a week ago he has shoulder surgery. Since then has been home. Both his sons have been sick within the past week, endorses they were negative for COVID. Were given antibiotics because they are both s/p splenectomy. Notes he started Monday with cough, congestion, sore throat starting Monday. Notes sore throat is more severe. Some left ear pressure and popping but without pain. Denies sinus pain. Endorses fever -- Tmax 101.5. -- starting this afternoon. Denies aches, chills. He is very concerned giving his recent surgery and status as a diabetic. Sent message to PCP this morning who endorsed likely viral giving known sick contacts and otherwise isolated state. Supportive measures recommended but he notes he did not have the fever then. He has personally not taken a home COVID test yet.  Has taken tylenol and Advil. Also taking robitussin and zyrtec.     HPI: HPI  Problems:  Patient Active Problem List   Diagnosis Date Noted   Secondary hypercoagulable state ([place]) 03/03/2019   Hypertension    Hyperlipidemia  Dyslipidemia    Cough syncope    Allergic rhinitis 07/04/2014   Arthritis 07/04/2014   Back pain, chronic 07/04/2014   Abnormal glucose tolerance test 07/04/2014   HLD (hyperlipidemia) 07/04/2014   Raynaud's syndrome 07/04/2014   Diabetes mellitus, type 2 ([place]) 07/04/2014   HYPERTENSION, BENIGN 02/20/2009   UNSPECIFIED ACUTE PERICARDITIS 01/23/2009   ATRIAL FIBRILLATION 01/23/2009   COUGH SYNCOPE 01/23/2009   DYSLIPIDEMIA 01/20/2009   Gout 01/20/2009    Allergies: No Known Allergies Medications:  Current Outpatient Medications:    benzonatate  (TESSALON) 100 MG capsule, Take 1 capsule (100 mg total) by mouth 3 (three) times daily as needed for cough., Disp: 30 capsule, Rfl: 0   doxycycline (VIBRA-TABS) 100 MG tablet, Take 1 tablet (100 mg total) by mouth 2 (two) times daily., Disp: 20 tablet, Rfl: 0   fluticasone (FLONASE) 50 MCG/ACT nasal spray, Place 2 sprays into both nostrils daily., Disp: 16 g, Rfl: 0   allopurinol (ZYLOPRIM) 300 MG tablet, TAKE TWO TABLETS EVERY DAY, Disp: 180 tablet, Rfl: 0   Cholecalciferol (VITAMIN D3) 2000 UNITS TABS, Take 1 tablet by mouth daily., Disp: , Rfl:    CINNAMON PO, Take 2,000 mg by mouth daily., Disp: , Rfl:    JARDIANCE 10 MG TABS tablet, TAKE 1 TABLET BY MOUTH DAILY, Disp: 90 tablet, Rfl: 3   losartan (COZAAR) 25 MG tablet, TAKE 1 TABLET BY MOUTH DAILY, Disp: 90 tablet, Rfl: 3   metFORMIN (GLUCOPHAGE-XR) 500 MG 24 hr tablet, TAKE TWO TABLETS TWICE DAILY, Disp: 360 tablet, Rfl: 1   metoprolol tartrate (LOPRESSOR) 50 MG tablet, Take one-half tablet (25 mg) by mouth twice daily as needed for palpitations and/or fast heart rates, Disp: 30 tablet, Rfl: 3   Omega-3 Fatty Acids (FISH OIL) 1000 MG CAPS, Take 1 capsule by mouth daily., Disp: , Rfl:    ONETOUCH VERIO test strip, CHECK BLOOD SUGAR DAILY, Disp: 50 each, Rfl: 11   rivaroxaban (XARELTO) 20 MG TABS tablet, Take 1 tablet by mouth daily with supper, Disp: 90 tablet, Rfl: 1   rosuvastatin (CRESTOR) 20 MG tablet, TAKE 1 TABLET BY MOUTH EVERY DAY., Disp: 90 tablet, Rfl: 1   sildenafil (REVATIO) 20 MG tablet, TAKE 1 TABLET 3 TIMES A DAY AS NEEDED, Disp: 50 tablet, Rfl: 3   traZODone (DESYREL) 100 MG tablet, Take 1 tablet (100 mg total) by mouth at bedtime as needed for sleep., Disp: 30 tablet, Rfl: 5   TURMERIC PO, Take by mouth daily. Dose unknown, Disp: , Rfl:    vitamin B-12 (CYANOCOBALAMIN) 1000 MCG tablet, Take 1,000 mcg by mouth daily., Disp: , Rfl:    zinc gluconate 50 MG tablet, Take 50 mg by mouth daily., Disp: , Rfl:    Observations/Objective: Patient is well-developed, well-nourished in no acute distress.  Resting comfortably at home.  Head is normocephalic, atraumatic.  No labored breathing. Speech is clear and coherent with logical content.  Patient is alert and oriented at baseline.   Assessment and Plan: 1. Upper respiratory tract infection, unspecified type - benzonatate (TESSALON) 100 MG capsule; Take 1 capsule (100 mg total) by mouth 3 (three) times daily as needed for cough.  Dispense: 30 capsule; Refill: 0 - fluticasone (FLONASE) 50 MCG/ACT nasal spray; Place 2 sprays into both nostrils daily.  Dispense: 16 g; Refill: 0 - doxycycline (VIBRA-TABS) 100 MG tablet; Take 1 tablet (100 mg total) by mouth 2 (two) times daily.  Dispense: 20 tablet; Refill: 0 2.5 days now with new onset fever. Agree  with PCP comment that most likely viral giving known sick contacts and that he has been isolated at home otherwise. Outside of window for antiviral for influenza. Discussed with current symptoms, recent and chronic medical history, he should take at least a home COVID test to ensure not positive as he would be candidate for COVID-19 antiviral treatment. Supportive measures, OTC medications and Vitamin recommendations reviewed. Rx Tessalon to help with cough and Flonase of nasal congestion/ETD/PND. Patient and wife are adamant about antibiotic therapy. Will send in script for Doxycycline to have on hand to start if COVID test negative and symptoms are not starting to ease up within 72 hours with other recommendations. Will send note to PCP making him aware of plan. Strict in-person evaluation precautions reviewed with patient.   Follow Up Instructions: I discussed the assessment and treatment plan with the patient. The patient was provided an opportunity to ask questions and all were answered. The patient agreed with the plan and demonstrated an understanding of the instructions.  A copy of instructions were sent to  the patient via MyChart unless otherwise noted below.    The patient was advised to call back or seek an in-person evaluation if the symptoms worsen or if the condition fails to improve as anticipated.  Time:  I spent 15 minutes with the patient via telehealth technology discussing the above problems/concerns.    Benjamin Rio, PA-C

## 2021-01-11 ENCOUNTER — Ambulatory Visit: Payer: Self-pay | Admitting: *Deleted

## 2021-01-11 NOTE — Telephone Encounter (Signed)
Per agent:  "Ok to leave clinical call per NT, message sent in teams, pt spoke with NT yesterday and is not getting any better, even after the medication that was sent in,  pt has a high fever and needed advice. "   Pt reports fever of 101.6 this evening. States has fluctuated all day with this as max, as low as 100.0  Did Evisit yesterday. Prescribed ATB. Took first dose at 1400 "Was told to hold off on antibiotics unless really needed them." States sore throat better, no cough, no SOB, no other symptoms except fever. States tested neg for covid. Does report chills and sweating at times. States is staying hydrated. Did have shoulder surgery last week. No increased pain, no redness or warmth. Had appt with surgeon today, "All looked good." Advised pt per protocol with modifications. Advised to take another antibiotic tonight, if temp does not go down with tylenol, reaches 103.0, go to ED. Pt verbalizes understanding.      Reason for Disposition  [1] Fever > 100.0 F (37.8 C) AND [2] surgery in the last month  Fever present > 3 days (72 hours)  Answer Assessment - Initial Assessment Questions 1. TEMPERATURE: "What is the most recent temperature?"  "How was it measured?"      101.6  at 1800 2. ONSET: "When did the fever start?"      Yesterday 3. CHILLS: "Do you have chills?" If yes: "How bad are they?"  (e.g., none, mild, moderate, severe)   - NONE: no chills   - MILD: feeling cold   - MODERATE: feeling very cold, some shivering (feels better under a thick blanket)   - SEVERE: feeling extremely cold with shaking chills (general body shaking, rigors; even under a thick blanket)      Chills, sweating 4. OTHER SYMPTOMS: "Do you have any other symptoms besides the fever?"  (e.g., abdomen pain, cough, diarrhea, earache, headache, sore throat, urination pain) Sore throat better,     5. CAUSE: If there are no symptoms, ask: "What do you think is causing the fever?"      No 6. CONTACTS: "Does  anyone else in the family have an infection?"     Both sons 16. TREATMENT: "What have you done so far to treat this fever?" (e.g., medications)     Advil and tylenol alternating 8. IMMUNOCOMPROMISE: "Do you have of the following: diabetes, HIV positive, splenectomy, cancer chemotherapy, chronic steroid treatment, transplant patient, etc."     *No Answer*  Protocols used: [facility]

## 2021-01-12 DIAGNOSIS — M25611 Stiffness of right shoulder, not elsewhere classified: Secondary | ICD-10-CM | POA: Diagnosis not present

## 2021-01-12 DIAGNOSIS — M25511 Pain in right shoulder: Secondary | ICD-10-CM | POA: Diagnosis not present

## 2021-01-15 DIAGNOSIS — M25511 Pain in right shoulder: Secondary | ICD-10-CM | POA: Diagnosis not present

## 2021-01-15 NOTE — Telephone Encounter (Signed)
Please advise 

## 2021-01-17 DIAGNOSIS — M25511 Pain in right shoulder: Secondary | ICD-10-CM | POA: Diagnosis not present

## 2021-01-17 DIAGNOSIS — M25611 Stiffness of right shoulder, not elsewhere classified: Secondary | ICD-10-CM | POA: Diagnosis not present

## 2021-01-22 DIAGNOSIS — M25511 Pain in right shoulder: Secondary | ICD-10-CM | POA: Diagnosis not present

## 2021-01-22 DIAGNOSIS — M25611 Stiffness of right shoulder, not elsewhere classified: Secondary | ICD-10-CM | POA: Diagnosis not present

## 2021-01-24 DIAGNOSIS — M25511 Pain in right shoulder: Secondary | ICD-10-CM | POA: Diagnosis not present

## 2021-01-24 DIAGNOSIS — M25611 Stiffness of right shoulder, not elsewhere classified: Secondary | ICD-10-CM | POA: Diagnosis not present

## 2021-01-28 ENCOUNTER — Other Ambulatory Visit: Payer: Self-pay | Admitting: Family Medicine

## 2021-01-28 DIAGNOSIS — N529 Male erectile dysfunction, unspecified: Secondary | ICD-10-CM

## 2021-01-29 ENCOUNTER — Other Ambulatory Visit: Payer: Self-pay | Admitting: Family Medicine

## 2021-01-29 DIAGNOSIS — M25611 Stiffness of right shoulder, not elsewhere classified: Secondary | ICD-10-CM | POA: Diagnosis not present

## 2021-01-29 DIAGNOSIS — M25511 Pain in right shoulder: Secondary | ICD-10-CM | POA: Diagnosis not present

## 2021-01-29 DIAGNOSIS — G4700 Insomnia, unspecified: Secondary | ICD-10-CM

## 2021-01-29 NOTE — Telephone Encounter (Signed)
Ok to refill w/ no further refills, not the typical dose for ED, but pt has been taking for long time

## 2021-01-31 DIAGNOSIS — M25511 Pain in right shoulder: Secondary | ICD-10-CM | POA: Diagnosis not present

## 2021-01-31 DIAGNOSIS — M25611 Stiffness of right shoulder, not elsewhere classified: Secondary | ICD-10-CM | POA: Diagnosis not present

## 2021-02-07 ENCOUNTER — Other Ambulatory Visit: Payer: Self-pay | Admitting: Family Medicine

## 2021-02-07 DIAGNOSIS — M25511 Pain in right shoulder: Secondary | ICD-10-CM | POA: Diagnosis not present

## 2021-02-07 DIAGNOSIS — E118 Type 2 diabetes mellitus with unspecified complications: Secondary | ICD-10-CM

## 2021-02-07 DIAGNOSIS — M25611 Stiffness of right shoulder, not elsewhere classified: Secondary | ICD-10-CM | POA: Diagnosis not present

## 2021-02-07 NOTE — Telephone Encounter (Signed)
Requested Prescriptions  Pending Prescriptions Disp Refills   metFORMIN (GLUCOPHAGE-XR) 500 MG 24 hr tablet [Pharmacy Med Name: METFORMIN HCL ER 500 MG TAB] 360 tablet 0    Sig: TAKE TWO TABLETS TWICE DAILY     Endocrinology:  Diabetes - Biguanides Passed - 02/07/2021  7:03 PM      Passed - Cr in normal range and within 360 days    Creatinine, Ser  Date Value Ref Range Status  04/04/2020 1.12 0.76 - 1.27 mg/dL Final    Comment:                   **Effective April 10, 2020 [facility] will begin**                  reporting the 2021 CKD-EPI creatinine equation that                  estimates kidney function without a race variable.          Passed - HBA1C is between 0 and 7.9 and within 180 days    Hemoglobin A1C  Date Value Ref Range Status  08/21/2020 7.0 (A) 4.0 - 5.6 % Final   Hgb A1c MFr Bld  Date Value Ref Range Status  04/04/2020 9.1 (H) 4.8 - 5.6 % Final    Comment:             Prediabetes: 5.7 - 6.4          Diabetes: >6.4          Glycemic control for adults with diabetes: <7.0          Passed - eGFR in normal range and within 360 days    GFR calc Af Amer  Date Value Ref Range Status  04/04/2020 77 >59 mL/min/1.73 Final    Comment:    **In accordance with recommendations from the NKF-ASN Task force,**   [facility] is in the process of updating its eGFR calculation to the   2021 CKD-EPI creatinine equation that estimates kidney function   without a race variable.    GFR calc non Af Amer  Date Value Ref Range Status  04/04/2020 66 >59 mL/min/1.73 Final         Passed - Valid encounter within last 6 months    Recent Outpatient Visits          3 months ago Type 2 diabetes mellitus without complication, without long-term current use of insulin [facility])   [facility] Jerrol Banana., MD   5 months ago Type 2 diabetes mellitus without complication, without long-term current use of insulin [facility])   [facility] Jerrol Banana., MD   10 months ago Annual physical exam   [facility] Jerrol Banana., MD   1 year ago Type 2 diabetes mellitus without complication, without long-term current use of insulin [facility])   [facility] Jerrol Banana., MD   1 year ago Annual physical exam   [facility] Jerrol Banana., MD      Future Appointments            In 3 weeks Baldwin [place], PA-C [facility], [facility]   In 2 months Jerrol Banana., MD [facility], [place]

## 2021-02-13 ENCOUNTER — Other Ambulatory Visit: Payer: Self-pay | Admitting: Internal Medicine

## 2021-02-13 NOTE — Telephone Encounter (Signed)
Xarelto 20 mg refill request received. Pt is 71 years old, weight- 77.6 kg, Crea- 1.2 on 04/04/20, last seen by [name] on 03/03/20, Diagnosis-PAF, CrCl- 66.4; Dose is appropriate based on dosing criteria. Will send in refill to requested pharmacy.

## 2021-02-16 DIAGNOSIS — M25611 Stiffness of right shoulder, not elsewhere classified: Secondary | ICD-10-CM | POA: Diagnosis not present

## 2021-02-16 DIAGNOSIS — M25511 Pain in right shoulder: Secondary | ICD-10-CM | POA: Diagnosis not present

## 2021-02-19 DIAGNOSIS — M25611 Stiffness of right shoulder, not elsewhere classified: Secondary | ICD-10-CM | POA: Diagnosis not present

## 2021-02-19 DIAGNOSIS — M25511 Pain in right shoulder: Secondary | ICD-10-CM | POA: Diagnosis not present

## 2021-02-21 DIAGNOSIS — M25511 Pain in right shoulder: Secondary | ICD-10-CM | POA: Diagnosis not present

## 2021-02-21 DIAGNOSIS — M25611 Stiffness of right shoulder, not elsewhere classified: Secondary | ICD-10-CM | POA: Diagnosis not present

## 2021-02-26 ENCOUNTER — Telehealth: Payer: Self-pay | Admitting: Family Medicine

## 2021-02-26 DIAGNOSIS — M25611 Stiffness of right shoulder, not elsewhere classified: Secondary | ICD-10-CM | POA: Diagnosis not present

## 2021-02-26 DIAGNOSIS — M25511 Pain in right shoulder: Secondary | ICD-10-CM | POA: Diagnosis not present

## 2021-02-26 NOTE — Progress Notes (Signed)
Cardiology Office Note Date:  02/26/2021  Patient ID:  Benjamin [name], Benjamin [name] May 08, 1950, MRN [id number] PCP:  Jerrol Banana., MD  Electrophysiologist: [name]    Chief Complaint: annual visit  History of Present Illness: Benjamin [name] is a 71 y.o. male with history of DM, HTN, HLD, AFib.  He comes in today to be seen for [name], last seen by him Jan 2022, at that time doing well, had some post exercise palpations that were brief. No changes were made.   TODAY He is doing very well. Exercises regularly with a personal trainer, had a shoulder surgery not too long ago that slowed him for a bit, continues with post-op PT, but back at exercising without exertional intolerances. NO CP, SOB, DOE. He has rare and self limited episodes of Afib, has not had to use the PRN metoprolol in years. No dizzy spells, near syncope or syncope No bleeding or signs of bleeding  His PMD does labs Q 6 mo  Afib hx Diagnosis goes as far back as 2010 Flecainide stopped 2012 (given no symptomatic Afib in <1year)  Past Medical History:  Diagnosis Date   Atrial fibrillation (HCC)    paroxysmal   Cancer (HCC)    SCC removed from neck   Cough syncope    Diabetes mellitus    Dyslipidemia    Gout    Hyperlipidemia    Hypertension    Hypopotassemia    Squamous cell carcinoma of skin 10/31/2016   right lat neck post aspect (in situ)    Past Surgical History:  Procedure Laterality Date   COLONOSCOPY WITH PROPOFOL N/A 11/07/2016   Procedure: COLONOSCOPY WITH PROPOFOL;  Surgeon: Lollie Sails, MD;  [facility] ENDOSCOPY;  Service: Endoscopy;  Laterality: N/A;   TONSILLECTOMY AND ADENOIDECTOMY      Current Outpatient Medications  Medication Sig Dispense Refill   allopurinol (ZYLOPRIM) 300 MG tablet TAKE TWO TABLETS EVERY DAY 180 tablet 0   benzonatate (TESSALON) 100 MG capsule Take 1 capsule (100 mg total) by mouth 3 (three) times daily as needed for cough. 30 capsule 0    Cholecalciferol (VITAMIN D3) 2000 UNITS TABS Take 1 tablet by mouth daily.     CINNAMON PO Take 2,000 mg by mouth daily.     doxycycline (VIBRA-TABS) 100 MG tablet Take 1 tablet (100 mg total) by mouth 2 (two) times daily. 20 tablet 0   fluticasone (FLONASE) 50 MCG/ACT nasal spray Place 2 sprays into both nostrils daily. 16 g 0   JARDIANCE 10 MG TABS tablet TAKE 1 TABLET BY MOUTH DAILY 90 tablet 3   losartan (COZAAR) 25 MG tablet TAKE 1 TABLET BY MOUTH DAILY 90 tablet 3   metFORMIN (GLUCOPHAGE-XR) 500 MG 24 hr tablet TAKE TWO TABLETS TWICE DAILY 360 tablet 0   metoprolol tartrate (LOPRESSOR) 50 MG tablet Take one-half tablet (25 mg) by mouth twice daily as needed for palpitations and/or fast heart rates 30 tablet 3   Omega-3 Fatty Acids (FISH OIL) 1000 MG CAPS Take 1 capsule by mouth daily.     ONETOUCH VERIO test strip CHECK BLOOD SUGAR DAILY 50 each 11   rosuvastatin (CRESTOR) 20 MG tablet TAKE 1 TABLET BY MOUTH EVERY DAY. 90 tablet 1   sildenafil (REVATIO) 20 MG tablet TAKE 1 TABLET 3 TIMES A DAY AS NEEDED 50 tablet 0   traZODone (DESYREL) 100 MG tablet Take 1 tablet (100 mg total) by mouth at bedtime as needed for sleep. 30 tablet  5   TURMERIC PO Take by mouth daily. Dose unknown     vitamin B-12 (CYANOCOBALAMIN) 1000 MCG tablet Take 1,000 mcg by mouth daily.     XARELTO 20 MG TABS tablet Take 1 tablet by mouth daily with supper 90 tablet 0   zinc gluconate 50 MG tablet Take 50 mg by mouth daily.     No current facility-administered medications for this visit.    Allergies:   Patient has no known allergies.   Social History:  The patient  reports that he quit smoking about 50 years ago. His smoking use included cigarettes. He has never used smokeless tobacco. He reports current alcohol use of about 4.0 standard drinks per week. He reports that he does not use drugs.   Family History:  The patient's family history includes Aneurysm in his mother; Atrial fibrillation in his mother; CAD in  his father; Congestive Heart Failure in his mother; Diabetes in his father; Gout in his father and son; Hearing loss in his son; Heart attack in his father; Hypertension in his father; Other in his son and son; Ovarian cancer in his mother; Ulcers in his father.  ROS:  Please see the history of present illness.    All other systems are reviewed and otherwise negative.   PHYSICAL EXAM:  VS:  There were no vitals taken for this visit. BMI: There is no height or weight on file to calculate BMI. Well nourished, well developed, in no acute distress HEENT: normocephalic, atraumatic Neck: no JVD, carotid bruits or masses Cardiac:   RRR; no significant murmurs, no rubs, or gallops Lungs:  CTA b/l, no wheezing, rhonchi or rales Abd: soft, nontender MS: no deformity or atrophy Ext: no edema Skin: warm and dry, no rash Neuro:  No gross deficits appreciated Psych: euthymic mood, full affect    EKG:  Done today and reviewed by myself shows  SR 62bpm, 1st degree AVblock 275ms  03/06/2016: TTE Study Conclusions  - Left ventricle: The cavity size was normal. Wall thickness was    normal. Systolic function was normal. The estimated ejection    fraction was in the range of 60% to 65%. Wall motion was normal;    there were no regional wall motion abnormalities. Doppler    parameters are consistent with abnormal left ventricular    relaxation (grade 1 diastolic dysfunction).  - Aortic valve: There was no stenosis.  - Aorta: Ascending aortic diameter: 38 mm (S).  - Ascending aorta: The ascending aorta was mildly dilated.  - Mitral valve: There was trivial regurgitation.  - Right ventricle: The cavity size was normal. Systolic function    was normal.  - Tricuspid valve: Peak RV-RA gradient (S): 18 mm Hg.  - Pulmonary arteries: PA peak pressure: 23 mm Hg (S).  - Inferior vena cava: The vessel was normal in size. The    respirophasic diameter changes were in the normal range (>= 50%),    consistent  with normal central venous pressure.   Impressions:   - Normal LV size with EF 60-65%. Normal RV size and systolic    function. No significant valvular abnormalities.   Recent Labs: 04/04/2020: ALT 24; BUN 26; Creatinine, Ser 1.12; Hemoglobin 15.2; Platelets 282; Potassium 4.5; Sodium 141; TSH 1.850  04/04/2020: Chol/HDL Ratio 3.5; Cholesterol, Total 133; HDL 38; LDL Chol Calc (NIH) 59; Triglycerides 220   CrCl cannot be calculated (Patient's most recent lab result is older than the maximum 21 days allowed.).   Wt Readings  from Last 3 Encounters:  10/26/20 171 lb (77.6 kg)  08/21/20 169 lb (76.7 kg)  04/04/20 174 lb (78.9 kg)     Other studies reviewed: Additional studies/records reviewed today include: summarized above  ASSESSMENT AND PLAN:  Paroxysmal Afib CHA2DS2Vasc is 3, on xarelto, appropriately dosed by last available labs minimal burden by symptoms  HTN Looks good  3. HLD PMD monitors and manages  Disposition: Will request his labs from his PMD, otherwise F/u with [place] again in a year, sooner if needed, he will transition to Dr. Quentin Ore.  Current medicines are reviewed at length with the patient today.  The patient did not have any concerns regarding medicines.  Venetia Night, PA-C 02/26/2021 10:07 AM     [address] [place] [address] [phone] (office)  [phone] (fax)

## 2021-02-26 NOTE — Telephone Encounter (Signed)
Copied from [place] [phone]. Topic: Medicare AWV >> Feb 26, 2021 10:42 AM Cher Nakai R wrote: Reason for CRM:  Left message for patient to call back and schedule Medicare Annual Wellness Visit (AWV) in office.   If not able to come in office, please offer to do virtually or by telephone.   Last AWV: 06/03/2019  Please schedule at anytime with [facility] Health Advisor.  If any questions, please contact me at [phone]

## 2021-02-28 DIAGNOSIS — M25511 Pain in right shoulder: Secondary | ICD-10-CM | POA: Diagnosis not present

## 2021-02-28 DIAGNOSIS — M25611 Stiffness of right shoulder, not elsewhere classified: Secondary | ICD-10-CM | POA: Diagnosis not present

## 2021-03-01 ENCOUNTER — Ambulatory Visit (INDEPENDENT_AMBULATORY_CARE_PROVIDER_SITE_OTHER): Payer: PPO

## 2021-03-01 DIAGNOSIS — Z0000 Encounter for general adult medical examination without abnormal findings: Secondary | ICD-10-CM | POA: Diagnosis not present

## 2021-03-01 NOTE — Progress Notes (Signed)
Virtual Visit via Telephone Note  I connected with  Benjamin [name] on 03/01/21 at  3:40 PM EST by telephone and verified that I am speaking with the correct person using two identifiers.  Location: Patient: home Provider: [facility] Persons participating in the virtual visit: [place]   I discussed the limitations, risks, security and privacy concerns of performing an evaluation and management service by telephone and the availability of in person appointments. The patient expressed understanding and agreed to proceed.  Interactive audio and video telecommunications were attempted between this nurse and patient, however failed, due to patient having technical difficulties OR patient did not have access to video capability.  We continued and completed visit with audio only.  Some vital signs may be absent or patient reported.   Dionisio David, LPN  Subjective:   Benjamin [name] is a 71 y.o. male who presents for Medicare Annual/Subsequent preventive examination.  Review of Systems           Objective:    There were no vitals filed for this visit. There is no height or weight on file to calculate BMI.  Advanced Directives 06/03/2019 02/13/2017 11/07/2016 01/30/2015 01/30/2015  Does Patient Have a Medical Advance Directive? Yes Yes Yes Yes No  Type of Paramedic of [place];Living will [place];Living will Living will [place];Living will -  Copy of [place] in Chart? No - copy requested No - copy requested - - -    Current Medications (verified) Outpatient Encounter Medications as of 03/01/2021  Medication Sig   allopurinol (ZYLOPRIM) 300 MG tablet TAKE TWO TABLETS EVERY DAY   benzonatate (TESSALON) 100 MG capsule Take 1 capsule (100 mg total) by mouth 3 (three) times daily as needed for cough.   Cholecalciferol (VITAMIN D3) 2000 UNITS TABS Take 1 tablet by mouth daily.   CINNAMON  PO Take 2,000 mg by mouth daily.   cyclobenzaprine (FLEXERIL) 10 MG tablet Take by mouth.   doxycycline (VIBRA-TABS) 100 MG tablet Take 1 tablet (100 mg total) by mouth 2 (two) times daily.   fluticasone (FLONASE) 50 MCG/ACT nasal spray Place 2 sprays into both nostrils daily.   glucose blood test strip    JARDIANCE 10 MG TABS tablet TAKE 1 TABLET BY MOUTH DAILY   losartan (COZAAR) 25 MG tablet TAKE 1 TABLET BY MOUTH DAILY   metFORMIN (GLUCOPHAGE) 1000 MG tablet 1 tablet with meals   metFORMIN (GLUCOPHAGE-XR) 500 MG 24 hr tablet TAKE TWO TABLETS TWICE DAILY   metoprolol tartrate (LOPRESSOR) 50 MG tablet Take one-half tablet (25 mg) by mouth twice daily as needed for palpitations and/or fast heart rates   Omega-3 Fatty Acids (FISH OIL) 1000 MG CAPS Take 1 capsule by mouth daily.   ondansetron (ZOFRAN) 4 MG tablet Take by mouth.   oxyCODONE-acetaminophen (PERCOCET/ROXICET) 5-325 MG tablet Take by mouth.   rosuvastatin (CRESTOR) 20 MG tablet TAKE 1 TABLET BY MOUTH EVERY DAY.   SHINGRIX injection    sildenafil (REVATIO) 20 MG tablet TAKE 1 TABLET 3 TIMES A DAY AS NEEDED   traZODone (DESYREL) 100 MG tablet Take 1 tablet (100 mg total) by mouth at bedtime as needed for sleep.   TURMERIC PO Take by mouth daily. Dose unknown   vitamin B-12 (CYANOCOBALAMIN) 1000 MCG tablet Take 1,000 mcg by mouth daily.   XARELTO 20 MG TABS tablet Take 1 tablet by mouth daily with supper   zinc gluconate 50 MG tablet  Take 50 mg by mouth daily.   [DISCONTINUED] allopurinol (ZYLOPRIM) 300 MG tablet 1 tablet   [DISCONTINUED] Cholecalciferol 50 MCG (2000 UT) TABS 1 capsule   [DISCONTINUED] glucose blood test strip OneTouch Verio test strips   [DISCONTINUED] losartan (COZAAR) 25 MG tablet 1 tablet   [DISCONTINUED] ondansetron (ZOFRAN) 4 MG tablet ondansetron HCl 4 mg tablet   [DISCONTINUED] ONETOUCH VERIO test strip CHECK BLOOD SUGAR DAILY   [DISCONTINUED] TURMERIC PO    [DISCONTINUED] vitamin B-12 (CYANOCOBALAMIN)  1000 MCG tablet 1 tablet   No facility-administered encounter medications on file as of 03/01/2021.    Allergies (verified) Patient has no known allergies.   History: Past Medical History:  Diagnosis Date   Atrial fibrillation ([place])    paroxysmal   Cancer (HCC)    SCC removed from neck   Cough syncope    Diabetes mellitus    Dyslipidemia    Gout    Hyperlipidemia    Hypertension    Hypopotassemia    Squamous cell carcinoma of skin 10/31/2016   right lat neck post aspect (in situ)   Past Surgical History:  Procedure Laterality Date   COLONOSCOPY WITH PROPOFOL N/A 11/07/2016   Procedure: COLONOSCOPY WITH PROPOFOL;  Surgeon: Lollie Sails, MD;  Location: [facility] ENDOSCOPY;  Service: Endoscopy;  Laterality: N/A;   TONSILLECTOMY AND ADENOIDECTOMY     Family History  Problem Relation Age of Onset   Atrial fibrillation Mother    Ovarian cancer Mother    Congestive Heart Failure Mother    Aneurysm Mother    Heart attack Father    Diabetes Father    Hypertension Father    Ulcers Father    Gout Father    CAD Father    Other Son        ITP   Gout Son    Other Son        ITP   Hearing loss Son    Social History   Socioeconomic History   Marital status: Married    Spouse name: Baldo Ash   Number of children: 2   Years of education: 16   Highest education level: Bachelor's degree (e.g., BA, AB, BS)  Occupational History   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  He has 2 adult sons.  He exercises as stated above.  No tobacco or illicit substance use.  He does enjoy occasional beer and vodka, mixed drink on the weekends.  He tries to follow a low-cholesterol diet.          FAMILY HISTORY:  Mother is alive, she has a pacemaker secondary to heart block, she also has a stable abdominal aneurysm.  Father's health, interesting that he had an MI at age 2 and then apparently died  in his sleep at age 8 that was felt to be secondary to MI.  He has 3 siblings who are alive and well.    Social Determinants of Health   Financial Resource Strain: Not on file  Food Insecurity: Not on file  Transportation Needs: Not on file  Physical Activity: Not on file  Stress: Not on file  Social Connections: Not on file    Tobacco Counseling Counseling given: Not Answered   Clinical Intake:  Pre-visit preparation completed: Yes  Pain : No/denies pain     Nutritional Risks: None Diabetes: Yes CBG done?: No Did pt. bring in CBG monitor from home?: No  How often do you need to have someone help you when you read instructions, pamphlets, or other written materials from your doctor or pharmacy?: 1 - Never  Diabetic?yes Nutrition Risk Assessment:  Has the patient had any N/V/D within the last 2 months?  No  Does the patient have any non-healing wounds?  No  Has the patient had any unintentional weight loss or weight gain?  No   Diabetes:  Is the patient diabetic?  Yes  If diabetic, was a CBG obtained today?  No  Did the patient bring in their glucometer from home?  No  How often do you monitor your CBG's? Every other day.   Financial Strains and Diabetes Management:  Are you having any financial strains with the device, your supplies or your medication? No .  Does the patient want to be seen by Chronic Care Management for management of their diabetes?  No  Would the  patient like to be referred to a Nutritionist or for Diabetic Management?  No   Diabetic Exams:  Diabetic Eye Exam: Completed 02/13/17. Overdue for diabetic eye exam. Pt has been advised about the importance in completing this exam. Has appointment on Feb. 2  Diabetic Foot Exam: Completed 02/13/17. Pt has been advised about the importance in completing this exam. Interpreter Needed?: No  Information entered by :: Kirke Shaggy, LPN   Activities of Daily Living In your present state of health, do you have any difficulty performing the following activities: 02/26/2021 04/04/2020  Hearing? N N  Vision? N N  Difficulty concentrating or making decisions? N N  Walking or climbing stairs? N N  Dressing or bathing? N N  Doing errands, shopping? N N  Using the Toilet? N -  In the past six months, have you accidently leaked urine? N -  Do you have problems with loss of bowel control? N -  Managing your Medications? N -  Managing your Finances? N -  Housekeeping or managing your Housekeeping? N -  Some recent data might be hidden    Patient Care Team: Jerrol Banana., MD as PCP - General (Family Medicine) Thompson Grayer, MD as PCP - Cardiology (Cardiology) Birder Robson, MD as Referring Physician (Ophthalmology) Thompson Grayer, MD as Consulting Physician (Cardiology) Ralene Bathe, MD as Consulting Physician (Dermatology) Prudencio Pair as Physician Assistant (Emergency Medicine)  Indicate any recent Medical Services you may have received from other than [facility] providers in the past year (date may be approximate).     Assessment:   This is a routine wellness examination for Benjamin [name].  Hearing/Vision screen No results found.  Dietary issues and exercise activities discussed:     Goals Addressed   None    Depression Screen PHQ 2/9 Scores 04/04/2020 03/25/2019 03/19/2018 02/13/2017 02/13/2017 10/11/2015 09/13/2014  PHQ - 2 Score 0 0  0 0 0 0 0  PHQ- 9 Score 1 0 - 2 - - -     Fall Risk Fall Risk  02/26/2021 04/04/2020 06/03/2019 03/25/2019 03/19/2018  Falls in the past year? 0 0 0 0 0  Number falls in past yr: 0 0 0 0 -  Injury with Fall? 0 0 0 0 -  Risk for fall due to : - No Fall Risks - - -  Follow up - Falls evaluation completed - Falls evaluation completed -    FALL RISK PREVENTION PERTAINING TO THE HOME:  Any stairs in or around the home? No  If so, are there any without handrails? No  Home free of loose throw rugs in walkways, pet beds, electrical cords, etc? Yes  Adequate lighting in your home to reduce risk of falls? Yes   ASSISTIVE DEVICES UTILIZED TO PREVENT FALLS:  Life alert? No  Use of a cane, walker or w/c? No  Grab bars in the bathroom? No  Shower chair or bench in shower? No  Elevated toilet seat or a handicapped toilet? No    Cognitive Function:Normal cognitive status assessed by direct observation by this Nurse Health Advisor. No abnormalities found.       6CIT Screen 06/03/2019  What Year? 0 points  What month? 0 points  What time? 0 points  Count back from 20 0 points  Months in reverse 0 points  Repeat phrase 0 points  Total Score 0    Immunizations Immunization History  Administered Date(s) Administered   Fluad Quad(high Dose 65+) 11/03/2018, 10/26/2020   Influenza, High Dose Seasonal PF 10/19/2015, 10/16/2017   Influenza-Unspecified 11/12/2014   Moderna Sars-Covid-2 Vaccination 03/16/2019, 04/15/2019, 12/17/2019   PNEUMOCOCCAL CONJUGATE-20 10/26/2020   Pneumococcal Conjugate-13 09/13/2014   Pneumococcal Polysaccharide-23 02/06/2000, 06/07/2009   Td 11/10/2003   Tdap 10/10/2010   Zoster Recombinat (Shingrix) 11/22/2020   Zoster, Live 11/14/2010    TDAP status: Due, Education has been provided regarding the importance of this vaccine. Advised may receive this vaccine at local pharmacy or Health Dept. Aware to provide a copy of the vaccination record if obtained from local pharmacy or Health Dept. Verbalized acceptance  and understanding.  Flu Vaccine status: Up to date  Pneumococcal vaccine status: Up to date  Covid-19 vaccine status: Completed vaccines  Qualifies for Shingles Vaccine? Yes   Zostavax completed Yes   Shingrix Completed?: Yes  Screening Tests Health Maintenance  Topic Date Due   FOOT EXAM  02/13/2018   COVID-19 Vaccine (4 - Booster for Moderna series) 02/11/2020   TETANUS/TDAP  10/09/2020   Zoster Vaccines- Shingrix (2 of 2) 01/17/2021   HEMOGLOBIN A1C  02/21/2021   OPHTHALMOLOGY EXAM  03/15/2021   COLONOSCOPY (Pts 45-41yrs Insurance coverage will need to be confirmed)  11/08/2026   Pneumonia Vaccine 57+ Years old  Completed   INFLUENZA VACCINE  Completed   Hepatitis C Screening  Completed   HPV VACCINES  Aged Out    Health Maintenance  Health Maintenance Due  Topic Date Due   FOOT EXAM  02/13/2018   COVID-19 Vaccine (4 - Booster for Moderna series) 02/11/2020   TETANUS/TDAP  10/09/2020   Zoster Vaccines- Shingrix (2 of 2) 01/17/2021   HEMOGLOBIN A1C  02/21/2021    Colorectal cancer screening: Type of screening: Colonoscopy. Completed 11/07/16. Repeat every 10 years  Lung Cancer Screening: (Low Dose CT Chest recommended if Age 73-80 years, 30 pack-year currently smoking OR have quit w/in 15years.) does not qualify.  Additional Screening:  Hepatitis C Screening: does qualify; Completed 03/31/12  Vision Screening: Recommended annual ophthalmology exams for early detection of glaucoma and other disorders of the eye. Is the patient up to date with their annual eye exam?  Yes  Who is the provider or what is the name of the office in which the patient attends annual eye exams? [facility] If pt is not established with a provider, would they like to be referred to a provider to establish care? No .   Dental Screening: Recommended annual dental exams for proper oral hygiene  Community Resource Referral / Chronic Care Management: CRR required this visit?  No    CCM required this visit?  No      Plan:     I have personally reviewed and noted the following in the patients chart:   Medical and social history Use of alcohol, tobacco or illicit drugs  Current medications and supplements including opioid prescriptions. Patient is not currently taking opioid prescriptions. Functional ability and status Nutritional status Physical activity Advanced directives List of other physicians Hospitalizations, surgeries, and ER visits in previous 12 months Vitals Screenings to include cognitive, depression, and falls Referrals and appointments  In addition, I have reviewed and discussed with patient certain preventive protocols, quality metrics, and best practice recommendations. A written personalized care plan for preventive services as well as general preventive health recommendations were provided to patient.     Dionisio David, LPN   [id number]   Nurse Notes: none

## 2021-03-01 NOTE — Patient Instructions (Signed)
Benjamin [name] , Thank you for taking time to come for your Medicare Wellness Visit. I appreciate your ongoing commitment to your health goals. Please review the following plan we discussed and let me know if I can assist you in the future.   Screening recommendations/referrals: Colonoscopy: 11/07/16 Recommended yearly ophthalmology/optometry visit for glaucoma screening and checkup Recommended yearly dental visit for hygiene and checkup  Vaccinations: Influenza vaccine: 10/26/20 Pneumococcal vaccine: 10/26/20 Tdap vaccine: 10/10/10 Shingles vaccine: Shingrix 11/22/20, getting 2nd shot next week  Zostavax 11/14/10 Covid-19: 03/16/19, 04/15/19, 12/17/19  Advanced directives: no  Conditions/risks identified: none  Next appointment: Follow up in one year for your annual wellness visit. 03/05/22 @ 3pm by phone  Preventive Care 65 Years and Older, Male Preventive care refers to lifestyle choices and visits with your health care provider that can promote health and wellness. What does preventive care include? A yearly physical exam. This is also called an annual well check. Dental exams once or twice a year. Routine eye exams. Ask your health care provider how often you should have your eyes checked. Personal lifestyle choices, including: Daily care of your teeth and gums. Regular physical activity. Eating a healthy diet. Avoiding tobacco and drug use. Limiting alcohol use. Practicing safe sex. Taking low doses of aspirin every day. Taking vitamin and mineral supplements as recommended by your health care provider. What happens during an annual well check? The services and screenings done by your health care provider during your annual well check will depend on your age, overall health, lifestyle risk factors, and family history of disease. Counseling  Your health care provider may ask you questions about your: Alcohol use. Tobacco use. Drug use. Emotional well-being. Home and relationship  well-being. Sexual activity. Eating habits. History of falls. Memory and ability to understand (cognition). Work and work Statistician. Screening  You may have the following tests or measurements: Height, weight, and BMI. Blood pressure. Lipid and cholesterol levels. These may be checked every 5 years, or more frequently if you are over 17 years old. Skin check. Lung cancer screening. You may have this screening every year starting at age 16 if you have a 30-pack-year history of smoking and currently smoke or have quit within the past 15 years. Fecal occult blood test (FOBT) of the stool. You may have this test every year starting at age 59. Flexible sigmoidoscopy or colonoscopy. You may have a sigmoidoscopy every 5 years or a colonoscopy every 10 years starting at age 64. Prostate cancer screening. Recommendations will vary depending on your family history and other risks. Hepatitis C blood test. Hepatitis B blood test. Sexually transmitted disease (STD) testing. Diabetes screening. This is done by checking your blood sugar (glucose) after you have not eaten for a while (fasting). You may have this done every 1-3 years. Abdominal aortic aneurysm (AAA) screening. You may need this if you are a current or former smoker. Osteoporosis. You may be screened starting at age 79 if you are at high risk. Talk with your health care provider about your test results, treatment options, and if necessary, the need for more tests. Vaccines  Your health care provider may recommend certain vaccines, such as: Influenza vaccine. This is recommended every year. Tetanus, diphtheria, and acellular pertussis (Tdap, Td) vaccine. You may need a Td booster every 10 years. Zoster vaccine. You may need this after age 48. Pneumococcal 13-valent conjugate (PCV13) vaccine. One dose is recommended after age 41. Pneumococcal polysaccharide (PPSV23) vaccine. One dose is recommended after age  26. Talk to your health care  provider about which screenings and vaccines you need and how often you need them. This information is not intended to replace advice given to you by your health care provider. Make sure you discuss any questions you have with your health care provider. Document Released: 02/24/2015 Document Revised: 10/18/2015 Document Reviewed: 11/29/2014 Elsevier Interactive Patient Education  2017 [place] Prevention in the Home Falls can cause injuries. They can happen to people of all ages. There are many things you can do to make your home safe and to help prevent falls. What can I do on the outside of my home? Regularly fix the edges of walkways and driveways and fix any cracks. Remove anything that might make you trip as you walk through a door, such as a raised step or threshold. Trim any bushes or trees on the path to your home. Use bright outdoor lighting. Clear any walking paths of anything that might make someone trip, such as rocks or tools. Regularly check to see if handrails are loose or broken. Make sure that both sides of any steps have handrails. Any raised decks and porches should have guardrails on the edges. Have any leaves, snow, or ice cleared regularly. Use sand or salt on walking paths during winter. Clean up any spills in your garage right away. This includes oil or grease spills. What can I do in the bathroom? Use night lights. Install grab bars by the toilet and in the tub and shower. Do not use towel bars as grab bars. Use non-skid mats or decals in the tub or shower. If you need to sit down in the shower, use a plastic, non-slip stool. Keep the floor dry. Clean up any water that spills on the floor as soon as it happens. Remove soap buildup in the tub or shower regularly. Attach bath mats securely with double-sided non-slip rug tape. Do not have throw rugs and other things on the floor that can make you trip. What can I do in the bedroom? Use night lights. Make  sure that you have a light by your bed that is easy to reach. Do not use any sheets or blankets that are too big for your bed. They should not hang down onto the floor. Have a firm chair that has side arms. You can use this for support while you get dressed. Do not have throw rugs and other things on the floor that can make you trip. What can I do in the kitchen? Clean up any spills right away. Avoid walking on wet floors. Keep items that you use a lot in easy-to-reach places. If you need to reach something above you, use a strong step stool that has a grab bar. Keep electrical cords out of the way. Do not use floor polish or wax that makes floors slippery. If you must use wax, use non-skid floor wax. Do not have throw rugs and other things on the floor that can make you trip. What can I do with my stairs? Do not leave any items on the stairs. Make sure that there are handrails on both sides of the stairs and use them. Fix handrails that are broken or loose. Make sure that handrails are as long as the stairways. Check any carpeting to make sure that it is firmly attached to the stairs. Fix any carpet that is loose or worn. Avoid having throw rugs at the top or bottom of the stairs. If you do have  throw rugs, attach them to the floor with carpet tape. Make sure that you have a light switch at the top of the stairs and the bottom of the stairs. If you do not have them, ask someone to add them for you. What else can I do to help prevent falls? Wear shoes that: Do not have high heels. Have rubber bottoms. Are comfortable and fit you well. Are closed at the toe. Do not wear sandals. If you use a stepladder: Make sure that it is fully opened. Do not climb a closed stepladder. Make sure that both sides of the stepladder are locked into place. Ask someone to hold it for you, if possible. Clearly mark and make sure that you can see: Any grab bars or handrails. First and last steps. Where the  edge of each step is. Use tools that help you move around (mobility aids) if they are needed. These include: Canes. Walkers. Scooters. Crutches. Turn on the lights when you go into a dark area. Replace any light bulbs as soon as they burn out. Set up your furniture so you have a clear path. Avoid moving your furniture around. If any of your floors are uneven, fix them. If there are any pets around you, be aware of where they are. Review your medicines with your doctor. Some medicines can make you feel dizzy. This can increase your chance of falling. Ask your doctor what other things that you can do to help prevent falls. This information is not intended to replace advice given to you by your health care provider. Make sure you discuss any questions you have with your health care provider. Document Released: 11/24/2008 Document Revised: 07/06/2015 Document Reviewed: 03/04/2014 Elsevier Interactive Patient Education  2017 [employer].

## 2021-03-05 ENCOUNTER — Ambulatory Visit: Payer: PPO | Admitting: Physician Assistant

## 2021-03-05 ENCOUNTER — Encounter: Payer: Self-pay | Admitting: Physician Assistant

## 2021-03-05 VITALS — BP 128/68 | HR 62 | Ht 69.0 in | Wt 169.2 lb

## 2021-03-05 DIAGNOSIS — I10 Essential (primary) hypertension: Secondary | ICD-10-CM

## 2021-03-05 DIAGNOSIS — I480 Paroxysmal atrial fibrillation: Secondary | ICD-10-CM | POA: Diagnosis not present

## 2021-03-05 NOTE — Patient Instructions (Signed)
Medication Instructions:  ° °Your physician recommends that you continue on your current medications as directed. Please refer to the Current Medication list given to you today. ° °*If you need a refill on your cardiac medications before your next appointment, please call your pharmacy* ° ° °Lab Work:  NONE ORDERED  TODAY ° ° °If you have labs (blood work) drawn today and your tests are completely normal, you will receive your results only by: °MyChart Message (if you have MyChart) OR °A paper copy in the mail °If you have any lab test that is abnormal or we need to change your treatment, we will call you to review the results. ° ° °Testing/Procedures: NONE ORDERED  TODAY ° ° ° ° °Follow-Up: °At [facility], you and your health needs are our priority.  As part of our continuing mission to provide you with exceptional heart care, we have created designated Provider Care Teams.  These Care Teams include your primary Cardiologist (physician) and Advanced Practice Providers (APPs -  Physician Assistants and Nurse Practitioners) who all work together to provide you with the care you need, when you need it. ° °We recommend signing up for the patient portal called "MyChart".  Sign up information is provided on this After Visit Summary.  MyChart is used to connect with patients for Virtual Visits (Telemedicine).  Patients are able to view lab/test results, encounter notes, upcoming appointments, etc.  Non-urgent messages can be sent to your provider as well.   °To learn more about what you can do with MyChart, go to [email or web address].   ° °Your next appointment:   °1 year(s) ° °The format for your next appointment:   °In Person ° °Provider:   °Cameron Lambert, MD  ° ° °Other Instructions ° °

## 2021-03-07 DIAGNOSIS — M25611 Stiffness of right shoulder, not elsewhere classified: Secondary | ICD-10-CM | POA: Diagnosis not present

## 2021-03-07 DIAGNOSIS — M25511 Pain in right shoulder: Secondary | ICD-10-CM | POA: Diagnosis not present

## 2021-03-09 DIAGNOSIS — M25611 Stiffness of right shoulder, not elsewhere classified: Secondary | ICD-10-CM | POA: Diagnosis not present

## 2021-03-09 DIAGNOSIS — M25511 Pain in right shoulder: Secondary | ICD-10-CM | POA: Diagnosis not present

## 2021-03-12 DIAGNOSIS — M25611 Stiffness of right shoulder, not elsewhere classified: Secondary | ICD-10-CM | POA: Diagnosis not present

## 2021-03-12 DIAGNOSIS — M25511 Pain in right shoulder: Secondary | ICD-10-CM | POA: Diagnosis not present

## 2021-03-14 DIAGNOSIS — M180 Bilateral primary osteoarthritis of first carpometacarpal joints: Secondary | ICD-10-CM | POA: Diagnosis not present

## 2021-03-14 DIAGNOSIS — M25611 Stiffness of right shoulder, not elsewhere classified: Secondary | ICD-10-CM | POA: Diagnosis not present

## 2021-03-14 DIAGNOSIS — M79644 Pain in right finger(s): Secondary | ICD-10-CM | POA: Diagnosis not present

## 2021-03-14 DIAGNOSIS — M79645 Pain in left finger(s): Secondary | ICD-10-CM | POA: Diagnosis not present

## 2021-03-14 DIAGNOSIS — M25511 Pain in right shoulder: Secondary | ICD-10-CM | POA: Diagnosis not present

## 2021-03-15 DIAGNOSIS — E119 Type 2 diabetes mellitus without complications: Secondary | ICD-10-CM | POA: Diagnosis not present

## 2021-03-15 LAB — HM DIABETES EYE EXAM

## 2021-03-19 DIAGNOSIS — M25511 Pain in right shoulder: Secondary | ICD-10-CM | POA: Diagnosis not present

## 2021-03-19 DIAGNOSIS — M25611 Stiffness of right shoulder, not elsewhere classified: Secondary | ICD-10-CM | POA: Diagnosis not present

## 2021-03-21 DIAGNOSIS — M25611 Stiffness of right shoulder, not elsewhere classified: Secondary | ICD-10-CM | POA: Diagnosis not present

## 2021-03-21 DIAGNOSIS — M25511 Pain in right shoulder: Secondary | ICD-10-CM | POA: Diagnosis not present

## 2021-03-26 ENCOUNTER — Encounter: Payer: Self-pay | Admitting: Family Medicine

## 2021-03-27 ENCOUNTER — Other Ambulatory Visit: Payer: Self-pay | Admitting: Family Medicine

## 2021-03-27 DIAGNOSIS — G4700 Insomnia, unspecified: Secondary | ICD-10-CM

## 2021-03-28 NOTE — Telephone Encounter (Signed)
Requested Prescriptions  Pending Prescriptions Disp Refills   traZODone (DESYREL) 100 MG tablet [Pharmacy Med Name: TRAZODONE HCL 100 MG TAB] 30 tablet 2    Sig: TAKE ONE TABLET BY MOUTH AT BEDTIME AS NEEDED FOR SLEEP     Psychiatry: Antidepressants - Serotonin Modulator Passed - 03/27/2021  1:07 PM      Passed - Valid encounter within last 6 months    Recent Outpatient Visits          5 months ago Type 2 diabetes mellitus without complication, without long-term current use of insulin [facility])   [facility] Jerrol Banana., MD   7 months ago Type 2 diabetes mellitus without complication, without long-term current use of insulin [facility])   [facility] Jerrol Banana., MD   11 months ago Annual physical exam   [facility] Jerrol Banana., MD   1 year ago Type 2 diabetes mellitus without complication, without long-term current use of insulin [facility])   [facility] Jerrol Banana., MD   2 years ago Annual physical exam   [facility] Jerrol Banana., MD      Future Appointments            In 4 weeks Jerrol Banana., MD [facility], [place]

## 2021-04-02 DIAGNOSIS — M25611 Stiffness of right shoulder, not elsewhere classified: Secondary | ICD-10-CM | POA: Diagnosis not present

## 2021-04-02 DIAGNOSIS — M25511 Pain in right shoulder: Secondary | ICD-10-CM | POA: Diagnosis not present

## 2021-04-04 DIAGNOSIS — M25511 Pain in right shoulder: Secondary | ICD-10-CM | POA: Diagnosis not present

## 2021-04-04 DIAGNOSIS — M25611 Stiffness of right shoulder, not elsewhere classified: Secondary | ICD-10-CM | POA: Diagnosis not present

## 2021-04-09 DIAGNOSIS — M25611 Stiffness of right shoulder, not elsewhere classified: Secondary | ICD-10-CM | POA: Diagnosis not present

## 2021-04-09 DIAGNOSIS — M25511 Pain in right shoulder: Secondary | ICD-10-CM | POA: Diagnosis not present

## 2021-04-11 DIAGNOSIS — M25511 Pain in right shoulder: Secondary | ICD-10-CM | POA: Diagnosis not present

## 2021-04-11 DIAGNOSIS — M25611 Stiffness of right shoulder, not elsewhere classified: Secondary | ICD-10-CM | POA: Diagnosis not present

## 2021-04-18 DIAGNOSIS — M25611 Stiffness of right shoulder, not elsewhere classified: Secondary | ICD-10-CM | POA: Diagnosis not present

## 2021-04-18 DIAGNOSIS — M25511 Pain in right shoulder: Secondary | ICD-10-CM | POA: Diagnosis not present

## 2021-04-20 DIAGNOSIS — M25511 Pain in right shoulder: Secondary | ICD-10-CM | POA: Diagnosis not present

## 2021-04-20 DIAGNOSIS — M25611 Stiffness of right shoulder, not elsewhere classified: Secondary | ICD-10-CM | POA: Diagnosis not present

## 2021-04-23 DIAGNOSIS — M25511 Pain in right shoulder: Secondary | ICD-10-CM | POA: Diagnosis not present

## 2021-04-23 DIAGNOSIS — M25611 Stiffness of right shoulder, not elsewhere classified: Secondary | ICD-10-CM | POA: Diagnosis not present

## 2021-04-24 ENCOUNTER — Encounter: Payer: Self-pay | Admitting: Family Medicine

## 2021-04-24 NOTE — Progress Notes (Signed)
?  ? ? ?Established patient visit ? ?I,April Miller,acting as a scribe for Wilhemena Durie, MD.,have documented all relevant documentation on the behalf of Wilhemena Durie, MD,as directed by  Wilhemena Durie, MD while in the presence of Wilhemena Durie, MD. ? ? ?Patient: Benjamin [name]   DOB: Jun 27, 1950   71 y.o. Male  MRN: [id number] ?Visit Date: 04/25/2021 ? ?Today's healthcare provider: Wilhemena Durie, MD  ? ?Chief Complaint  ?Patient presents with  ? Follow-up  ? Diabetes  ? Hypertension  ? Hyperlipidemia  ? ?Subjective  ?  ?HPI  ?Patient comes in today for follow-up.  He is slowly recovering from shoulder surgery from over her 22nd 2022. ?He is a little bit more lax in his dietary regimen recently.  He feels well overall. ?Taking his medications as prescribed without side effects. ?Diabetes Mellitus Type II, follow-up ? ?Lab Results  ?Component Value Date  ? HGBA1C 7.0 (A) 08/21/2020  ? HGBA1C 9.1 (H) 04/04/2020  ? HGBA1C 7.1 (H) 09/22/2019  ? ?Last seen for diabetes 6 months ago.  ?Management since then includes continuing the same treatment. ?He reports good compliance with treatment. ?He is not having side effects. none ? ?Home blood sugar records: fasting range: 130-185 ? ?Episodes of hypoglycemia? No none ?  ?Current insulin regiment: n/a ?Most Recent Eye Exam: 03/15/2021 ? ?--------------------------------------------------------------------------------------------------- ?Hypertension, follow-up ? ?BP Readings from Last 3 Encounters:  ?04/25/21 108/74  ?03/05/21 128/68  ?10/26/20 112/73  ? Wt Readings from Last 3 Encounters:  ?04/25/21 166 lb (75.3 kg)  ?03/05/21 169 lb 3.2 oz (76.7 kg)  ?10/26/20 171 lb (77.6 kg)  ?  ? ?He was last seen for hypertension 6 months ago.  ?BP at that visit was 112/73. ?Management since that visit includes; on losartan and metoprolol. ?He reports good compliance with treatment. ?He is not having side effects. none ?He is exercising. ?He is adherent to low salt  diet.   ?Outside blood pressures are not checking. ? ?He does not smoke. ? ?Use of agents associated with hypertension: none.  ? ?--------------------------------------------------------------------------------------------------- ?Lipid/Cholesterol, follow-up ? ?Last Lipid Panel: ?Lab Results  ?Component Value Date  ? CHOL 133 04/04/2020  ? [place] 59 04/04/2020  ? HDL 38 (L) 04/04/2020  ? TRIG 220 (H) 04/04/2020  ? ? ?He was last seen for this 67month ago.  ?Management since that visit includes; on rosuvastatin. ? ?He reports good compliance with treatment. ?He is not having side effects. none ? ?He is following a Regular diet. ?Current exercise:  gym-personal trainer ? ?Last metabolic panel ?Lab Results  ?Component Value Date  ? GLUCOSE 248 (H) 04/04/2020  ? NA 141 04/04/2020  ? K 4.5 04/04/2020  ? BUN 26 04/04/2020  ? CREATININE 1.12 04/04/2020  ? GFRNONAA 66 04/04/2020  ? CALCIUM 9.6 04/04/2020  ? AST 20 04/04/2020  ? ALT 24 04/04/2020  ? ?The 10-year ASCVD risk score (Arnett DK, et al., 2019) is: 28.2% ? ?--------------------------------------------------------------------------------------------------- ? ? ?Medications: ?Outpatient Medications Prior to Visit  ?Medication Sig  ? allopurinol (ZYLOPRIM) 300 MG tablet TAKE TWO TABLETS EVERY DAY  ? Cholecalciferol (VITAMIN D3) 2000 UNITS TABS Take 1 tablet by mouth daily.  ? glucose blood test strip   ? JARDIANCE 10 MG TABS tablet TAKE 1 TABLET BY MOUTH DAILY  ? losartan (COZAAR) 25 MG tablet TAKE 1 TABLET BY MOUTH DAILY  ? metFORMIN (GLUCOPHAGE-XR) 500 MG 24 hr tablet TAKE TWO TABLETS TWICE DAILY  ? metoprolol tartrate (LOPRESSOR) 50 MG  tablet Take one-half tablet (25 mg) by mouth twice daily as needed for palpitations and/or fast heart rates  ? Multiple Vitamin (MULTIVITAMIN PO) Take by mouth.  ? rosuvastatin (CRESTOR) 20 MG tablet TAKE 1 TABLET BY MOUTH EVERY DAY.  ? sildenafil (REVATIO) 20 MG tablet TAKE 1 TABLET 3 TIMES A DAY AS NEEDED  ? traZODone (DESYREL)  100 MG tablet TAKE ONE TABLET BY MOUTH AT BEDTIME AS NEEDED FOR SLEEP  ? vitamin B-12 (CYANOCOBALAMIN) 1000 MCG tablet Take 1,000 mcg by mouth daily.  ? XARELTO 20 MG TABS tablet Take 1 tablet by mouth daily with supper  ? [DISCONTINUED] cyclobenzaprine (FLEXERIL) 10 MG tablet Take by mouth. (Patient not taking: Reported on 04/25/2021)  ? [DISCONTINUED] fluticasone (FLONASE) 50 MCG/ACT nasal spray Place 2 sprays into both nostrils daily. (Patient not taking: Reported on 04/25/2021)  ? [DISCONTINUED] Omega-3 Fatty Acids (FISH OIL) 1000 MG CAPS Take 1 capsule by mouth daily. (Patient not taking: Reported on 04/25/2021)  ? [DISCONTINUED] ondansetron (ZOFRAN) 4 MG tablet Take by mouth. (Patient not taking: Reported on 04/25/2021)  ? [DISCONTINUED] SHINGRIX injection  (Patient not taking: Reported on 04/25/2021)  ? [DISCONTINUED] zinc gluconate 50 MG tablet Take 50 mg by mouth daily. (Patient not taking: Reported on 04/25/2021)  ? ?No facility-administered medications prior to visit.  ? ? ?Review of Systems  ?Constitutional:  Negative for appetite change, chills and fever.  ?Respiratory:  Negative for chest tightness, shortness of breath and wheezing.   ?Cardiovascular:  Negative for chest pain and palpitations.  ?Gastrointestinal:  Negative for abdominal pain, nausea and vomiting.  ? ?Last hemoglobin A1c ?Lab Results  ?Component Value Date  ? HGBA1C 7.7 (H) 04/25/2021  ? ?  ?  Objective  ?  ?BP 108/74 (BP Location: Left Arm, Patient Position: Sitting, Cuff Size: Large)   Pulse 92   Temp 98.6 ?F (37 ?C) (Temporal)   Resp 16   Ht '5\' 9"'$  (1.753 m)   Wt 166 lb (75.3 kg)   SpO2 98%   BMI 24.51 kg/m?  ?BP Readings from Last 3 Encounters:  ?04/25/21 108/74  ?03/05/21 128/68  ?10/26/20 112/73  ? ?Wt Readings from Last 3 Encounters:  ?04/25/21 166 lb (75.3 kg)  ?03/05/21 169 lb 3.2 oz (76.7 kg)  ?10/26/20 171 lb (77.6 kg)  ? ?  ? ?Physical Exam ?Vitals reviewed.  ?Constitutional:   ?   Appearance: He is well-developed.  ?HENT:   ?   Head: Normocephalic and atraumatic.  ?   Right Ear: External ear normal.  ?   Left Ear: External ear normal.  ?   Nose: Nose normal.  ?Eyes:  ?   Conjunctiva/sclera: Conjunctivae normal.  ?Neck:  ?   Thyroid: No thyromegaly.  ?Cardiovascular:  ?   Rate and Rhythm: Normal rate and regular rhythm.  ?   Heart sounds: Normal heart sounds.  ?Pulmonary:  ?   Effort: Pulmonary effort is normal.  ?   Breath sounds: Normal breath sounds.  ?Abdominal:  ?   Palpations: Abdomen is soft.  ?Skin: ?   General: Skin is warm and dry.  ?Neurological:  ?   Mental Status: He is alert and oriented to person, place, and time.  ?Psychiatric:     ?   Behavior: Behavior normal.     ?   Thought Content: Thought content normal.     ?   Judgment: Judgment normal.  ?  ? ? ?No results found for any visits on 04/25/21. ? Assessment &  Plan  ?  ? ?1. Type 2 diabetes mellitus without complication, without long-term current use of insulin ([place]) ?Normal diabetic foot exam.  Goal A1c less than 7.0 ?On metformin and Jardiance ?- Lipid panel ?- TSH ?- CBC w/Diff/Platelet ?- Comprehensive Metabolic Panel (CMET) ?- Hemoglobin A1c ? ?2. Primary hypertension ?Good control. ?- Lipid panel ?- TSH ?- CBC w/Diff/Platelet ?- Comprehensive Metabolic Panel (CMET) ?- Hemoglobin A1c ? ?3. Hyperlipidemia, unspecified hyperlipidemia type ?On rosuvastatin 20 ?- Lipid panel ?- TSH ?- CBC w/Diff/Platelet ?- Comprehensive Metabolic Panel (CMET) ?- Hemoglobin A1c ? ?4. Chronic atrial fibrillation (HCC) ?On Xarelto ? ? ? ? ?No follow-ups on file.  ?   ? ?I, Wilhemena Durie, MD, have reviewed all documentation for this visit. The documentation on 04/28/21 for the exam, diagnosis, procedures, and orders are all accurate and complete. ? ? ? ?Yigit Norkus Cranford Mon, MD  ?[facility] ?[phone] (phone) ?[phone] (fax) ? ?[facility] ?

## 2021-04-25 ENCOUNTER — Encounter: Payer: Self-pay | Admitting: Family Medicine

## 2021-04-25 ENCOUNTER — Other Ambulatory Visit: Payer: Self-pay

## 2021-04-25 ENCOUNTER — Ambulatory Visit (INDEPENDENT_AMBULATORY_CARE_PROVIDER_SITE_OTHER): Payer: PPO | Admitting: Family Medicine

## 2021-04-25 VITALS — BP 108/74 | HR 92 | Temp 98.6°F | Resp 16 | Ht 69.0 in | Wt 166.0 lb

## 2021-04-25 DIAGNOSIS — I482 Chronic atrial fibrillation, unspecified: Secondary | ICD-10-CM

## 2021-04-25 DIAGNOSIS — M25611 Stiffness of right shoulder, not elsewhere classified: Secondary | ICD-10-CM | POA: Diagnosis not present

## 2021-04-25 DIAGNOSIS — I10 Essential (primary) hypertension: Secondary | ICD-10-CM | POA: Diagnosis not present

## 2021-04-25 DIAGNOSIS — E119 Type 2 diabetes mellitus without complications: Secondary | ICD-10-CM

## 2021-04-25 DIAGNOSIS — E782 Mixed hyperlipidemia: Secondary | ICD-10-CM | POA: Diagnosis not present

## 2021-04-25 DIAGNOSIS — M25511 Pain in right shoulder: Secondary | ICD-10-CM | POA: Diagnosis not present

## 2021-04-25 DIAGNOSIS — E785 Hyperlipidemia, unspecified: Secondary | ICD-10-CM

## 2021-04-26 LAB — COMPREHENSIVE METABOLIC PANEL
ALT: 30 IU/L (ref 0–44)
AST: 27 IU/L (ref 0–40)
Albumin/Globulin Ratio: 2 (ref 1.2–2.2)
Albumin: 4.7 g/dL (ref 3.7–4.7)
Alkaline Phosphatase: 75 IU/L (ref 44–121)
BUN/Creatinine Ratio: 26 — ABNORMAL HIGH (ref 10–24)
BUN: 26 mg/dL (ref 8–27)
Bilirubin Total: 0.4 mg/dL (ref 0.0–1.2)
CO2: 21 mmol/L (ref 20–29)
Calcium: 9.7 mg/dL (ref 8.6–10.2)
Chloride: 103 mmol/L (ref 96–106)
Creatinine, Ser: 1.01 mg/dL (ref 0.76–1.27)
Globulin, Total: 2.3 g/dL (ref 1.5–4.5)
Glucose: 201 mg/dL — ABNORMAL HIGH (ref 70–99)
Potassium: 4.3 mmol/L (ref 3.5–5.2)
Sodium: 139 mmol/L (ref 134–144)
Total Protein: 7 g/dL (ref 6.0–8.5)
eGFR: 80 mL/min/{1.73_m2} (ref >59)

## 2021-04-26 LAB — CBC WITH DIFFERENTIAL/PLATELET
Basophils Absolute: 0.1 10*3/uL (ref 0.0–0.2)
Basos: 1 %
EOS (ABSOLUTE): 0.2 10*3/uL (ref 0.0–0.4)
Eos: 2 %
Hematocrit: 46.6 % (ref 37.5–51.0)
Hemoglobin: 16.1 g/dL (ref 13.0–17.7)
Immature Grans (Abs): 0 10*3/uL (ref 0.0–0.1)
Immature Granulocytes: 1 %
Lymphocytes Absolute: 2.2 10*3/uL (ref 0.7–3.1)
Lymphs: 27 %
MCH: 28.8 pg (ref 26.6–33.0)
MCHC: 34.5 g/dL (ref 31.5–35.7)
MCV: 83 fL (ref 79–97)
Monocytes Absolute: 0.7 10*3/uL (ref 0.1–0.9)
Monocytes: 9 %
Neutrophils Absolute: 4.9 10*3/uL (ref 1.4–7.0)
Neutrophils: 60 %
Platelets: 276 10*3/uL (ref 150–450)
RBC: 5.6 x10E6/uL (ref 4.14–5.80)
RDW: 14.4 % (ref 11.6–15.4)
WBC: 8.1 10*3/uL (ref 3.4–10.8)

## 2021-04-26 LAB — LIPID PANEL
Chol/HDL Ratio: 3.5 ratio (ref 0.0–5.0)
Cholesterol, Total: 155 mg/dL (ref 100–199)
HDL: 44 mg/dL (ref >39)
LDL Chol Calc (NIH): 55 mg/dL (ref 0–99)
Triglycerides: 372 mg/dL — ABNORMAL HIGH (ref 0–149)
VLDL Cholesterol Cal: 56 mg/dL — ABNORMAL HIGH (ref 5–40)

## 2021-04-26 LAB — TSH: TSH: 2.17 u[IU]/mL (ref 0.450–4.500)

## 2021-04-26 LAB — HEMOGLOBIN A1C
Est. average glucose Bld gHb Est-mCnc: 174 mg/dL
Hgb A1c MFr Bld: 7.7 % — ABNORMAL HIGH (ref 4.8–5.6)

## 2021-05-10 ENCOUNTER — Other Ambulatory Visit: Payer: Self-pay | Admitting: Internal Medicine

## 2021-05-10 DIAGNOSIS — I482 Chronic atrial fibrillation, unspecified: Secondary | ICD-10-CM

## 2021-05-10 NOTE — Telephone Encounter (Signed)
Prescription refill request for Xarelto received.  ?Indication:Afib  ?Last office visit: 03/05/21 [facility] Cradle)  ?Weight: 75.3kg ?Age:71 ?Scr: 1.01 (04/25/21) ?CrCl: 71.68m/min ? ?Appropriate dose and refill sent to requested pharmacy.  ?

## 2021-05-30 ENCOUNTER — Other Ambulatory Visit: Payer: Self-pay | Admitting: Family Medicine

## 2021-05-30 DIAGNOSIS — G4700 Insomnia, unspecified: Secondary | ICD-10-CM

## 2021-06-13 ENCOUNTER — Other Ambulatory Visit: Payer: Self-pay | Admitting: Family Medicine

## 2021-06-13 NOTE — Telephone Encounter (Signed)
Requested medication (s) are due for refill today: yes ? ?Requested medication (s) are on the active medication list: yes ? ?Last refill:  03/14/20 #180 ? ?Future visit scheduled: yes ? ?Notes to clinic:  needs lab work - last Uric acid was 02/13/2017 ? ? ?Requested Prescriptions  ?Pending Prescriptions Disp Refills  ? allopurinol (ZYLOPRIM) 300 MG tablet [Pharmacy Med Name: ALLOPURINOL 300 MG TAB] 180 tablet 0  ?  Sig: TAKE 1 TABLET BY MOUTH TWICE DAILY  ?  ? Endocrinology:  Gout Agents - allopurinol Failed - 06/13/2021  8:09 AM  ?  ?  Failed - Uric Acid in normal range and within 360 days  ?  Uric Acid  ?Date Value Ref Range Status  ?02/13/2017 3.7 3.7 - 8.6 mg/dL Final  ?  Comment:  ?             Therapeutic target for gout patients: <6.0  ?  ?  ?  ?  Passed - Cr in normal range and within 360 days  ?  Creatinine, Ser  ?Date Value Ref Range Status  ?04/25/2021 1.01 0.76 - 1.27 mg/dL Final  ?  ?  ?  ?  Passed - Valid encounter within last 12 months  ?  Recent Outpatient Visits   ? ?      ? 1 month ago Type 2 diabetes mellitus without complication, without long-term current use of insulin ([place])  ? [facility] Jerrol Banana., MD  ? 7 months ago Type 2 diabetes mellitus without complication, without long-term current use of insulin ([place])  ? [facility] Jerrol Banana., MD  ? 9 months ago Type 2 diabetes mellitus without complication, without long-term current use of insulin ([place])  ? [facility] Jerrol Banana., MD  ? 1 year ago Annual physical exam  ? [facility] Jerrol Banana., MD  ? 1 year ago Type 2 diabetes mellitus without complication, without long-term current use of insulin ([place])  ? [facility] Jerrol Banana., MD  ? ?  ?  ?Future Appointments   ? ?        ? In 2 months Jerrol Banana., MD [facility]  ? ?  ? ? ?  ?  ?  Passed - CBC within normal limits and completed in the last  12 months  ?  WBC  ?Date Value Ref Range Status  ?04/25/2021 8.1 3.4 - 10.8 x10E3/uL Final  ?01/07/2009 10.0 4.0 - 10.5 K/uL Final  ? ?RBC  ?Date Value Ref Range Status  ?04/25/2021 5.60 4.14 - 5.80 x10E6/uL Final  ?01/07/2009 4.57 4.22 - 5.81 MIL/uL Final  ? ?Hemoglobin  ?Date Value Ref Range Status  ?04/25/2021 16.1 13.0 - 17.7 g/dL Final  ? ?Hematocrit  ?Date Value Ref Range Status  ?04/25/2021 46.6 37.5 - 51.0 % Final  ? ?MCHC  ?Date Value Ref Range Status  ?04/25/2021 34.5 31.5 - 35.7 g/dL Final  ?01/07/2009 34.9 30.0 - 36.0 g/dL Final  ? ?MCH  ?Date Value Ref Range Status  ?04/25/2021 28.8 26.6 - 33.0 pg Final  ? ?MCV  ?Date Value Ref Range Status  ?04/25/2021 83 79 - 97 fL Final  ? ?No results found for: PLTCOUNTKUC, LABPLAT, [place] ?RDW  ?Date Value Ref Range Status  ?04/25/2021 14.4 11.6 - 15.4 % Final  ? ?  ?  ?  ? ? ? ? ?

## 2021-07-02 ENCOUNTER — Other Ambulatory Visit: Payer: Self-pay | Admitting: Family Medicine

## 2021-07-02 DIAGNOSIS — G4700 Insomnia, unspecified: Secondary | ICD-10-CM

## 2021-07-12 DIAGNOSIS — M9903 Segmental and somatic dysfunction of lumbar region: Secondary | ICD-10-CM | POA: Diagnosis not present

## 2021-07-12 DIAGNOSIS — M9905 Segmental and somatic dysfunction of pelvic region: Secondary | ICD-10-CM | POA: Diagnosis not present

## 2021-07-12 DIAGNOSIS — M5431 Sciatica, right side: Secondary | ICD-10-CM | POA: Diagnosis not present

## 2021-07-12 DIAGNOSIS — M5416 Radiculopathy, lumbar region: Secondary | ICD-10-CM | POA: Diagnosis not present

## 2021-07-19 DIAGNOSIS — M9903 Segmental and somatic dysfunction of lumbar region: Secondary | ICD-10-CM | POA: Diagnosis not present

## 2021-07-19 DIAGNOSIS — M5431 Sciatica, right side: Secondary | ICD-10-CM | POA: Diagnosis not present

## 2021-07-19 DIAGNOSIS — M9905 Segmental and somatic dysfunction of pelvic region: Secondary | ICD-10-CM | POA: Diagnosis not present

## 2021-07-19 DIAGNOSIS — M5416 Radiculopathy, lumbar region: Secondary | ICD-10-CM | POA: Diagnosis not present

## 2021-07-26 DIAGNOSIS — M9905 Segmental and somatic dysfunction of pelvic region: Secondary | ICD-10-CM | POA: Diagnosis not present

## 2021-07-26 DIAGNOSIS — M5431 Sciatica, right side: Secondary | ICD-10-CM | POA: Diagnosis not present

## 2021-07-26 DIAGNOSIS — M9903 Segmental and somatic dysfunction of lumbar region: Secondary | ICD-10-CM | POA: Diagnosis not present

## 2021-07-26 DIAGNOSIS — M5416 Radiculopathy, lumbar region: Secondary | ICD-10-CM | POA: Diagnosis not present

## 2021-08-02 DIAGNOSIS — M9903 Segmental and somatic dysfunction of lumbar region: Secondary | ICD-10-CM | POA: Diagnosis not present

## 2021-08-02 DIAGNOSIS — M5416 Radiculopathy, lumbar region: Secondary | ICD-10-CM | POA: Diagnosis not present

## 2021-08-02 DIAGNOSIS — M5431 Sciatica, right side: Secondary | ICD-10-CM | POA: Diagnosis not present

## 2021-08-02 DIAGNOSIS — M9905 Segmental and somatic dysfunction of pelvic region: Secondary | ICD-10-CM | POA: Diagnosis not present

## 2021-08-11 ENCOUNTER — Other Ambulatory Visit: Payer: Self-pay | Admitting: Family Medicine

## 2021-08-11 DIAGNOSIS — E118 Type 2 diabetes mellitus with unspecified complications: Secondary | ICD-10-CM

## 2021-08-16 DIAGNOSIS — M9905 Segmental and somatic dysfunction of pelvic region: Secondary | ICD-10-CM | POA: Diagnosis not present

## 2021-08-16 DIAGNOSIS — M5416 Radiculopathy, lumbar region: Secondary | ICD-10-CM | POA: Diagnosis not present

## 2021-08-16 DIAGNOSIS — M9903 Segmental and somatic dysfunction of lumbar region: Secondary | ICD-10-CM | POA: Diagnosis not present

## 2021-08-16 DIAGNOSIS — M5431 Sciatica, right side: Secondary | ICD-10-CM | POA: Diagnosis not present

## 2021-08-23 NOTE — Progress Notes (Signed)
Established patient visit   Patient: Benjamin [name]   DOB: [birth year]   71 y.o. Male  MRN: [id number] Visit Date: 08/27/2021  Today's healthcare provider: Wilhemena Durie, MD   No chief complaint on file.  Subjective    HPI  Patient comes in today for follow-up.  He feels well and has no complaints.  He is taking his medications.  Jardiance does occasionally irritate his genitalia as he is uncircumcised.  He is having no present problems.   Diabetes Mellitus Type II, follow-up  Lab Results  Component Value Date   HGBA1C 7.2 (A) 08/27/2021   HGBA1C 7.7 (H) 04/25/2021   HGBA1C 7.0 (A) 08/21/2020   Last seen for diabetes 3 months ago.  Management since then includes; On metformin and [place] blood sugar records:  120-190 Most Recent Eye Exam: 03/15/2021  --------------------------------------------------------------------------------------------------- Hypertension, follow-up  BP Readings from Last 3 Encounters:  08/27/21 112/68  04/25/21 108/74  03/05/21 128/68   Wt Readings from Last 3 Encounters:  08/27/21 169 lb (76.7 kg)  04/25/21 166 lb (75.3 kg)  03/05/21 169 lb 3.2 oz (76.7 kg)     He was last seen for hypertension 3 months ago.  Management since that visit includes; Good control.  Outside blood pressures are not being checked regularly.  --------------------------------------------------------------------------------------------------- Lipid/Cholesterol, follow-up  Last Lipid Panel: Lab Results  Component Value Date   CHOL 155 04/25/2021   LDLCALC 55 04/25/2021   HDL 44 04/25/2021   TRIG 372 (H) 04/25/2021    He was last seen for this 3 months ago.  Management since that visit includes; labs checked showing-stable.  Last metabolic panel Lab Results  Component Value Date   GLUCOSE 201 (H) 04/25/2021   NA 139 04/25/2021   K 4.3 04/25/2021   BUN 26 04/25/2021   CREATININE 1.01 04/25/2021   EGFR 80 04/25/2021   GFRNONAA 66  04/04/2020   CALCIUM 9.7 04/25/2021   AST 27 04/25/2021   ALT 30 04/25/2021   The 10-year ASCVD risk score (Arnett DK, et al., 2019) is: 32%  ---------------------------------------------------------------------------------------------------   Medications: Outpatient Medications Prior to Visit  Medication Sig   allopurinol (ZYLOPRIM) 300 MG tablet TAKE 1 TABLET BY MOUTH TWICE DAILY   Cholecalciferol (VITAMIN D3) 2000 UNITS TABS Take 1 tablet by mouth daily.   glucose blood test strip    JARDIANCE 10 MG TABS tablet TAKE 1 TABLET BY MOUTH DAILY   losartan (COZAAR) 25 MG tablet TAKE 1 TABLET BY MOUTH DAILY   metFORMIN (GLUCOPHAGE-XR) 500 MG 24 hr tablet TAKE TWO TABLETS TWICE DAILY   metoprolol tartrate (LOPRESSOR) 50 MG tablet Take one-half tablet (25 mg) by mouth twice daily as needed for palpitations and/or fast heart rates   Multiple Vitamin (MULTIVITAMIN PO) Take by mouth.   rivaroxaban (XARELTO) 20 MG TABS tablet Take 1 tablet by mouth daily with supper.   rosuvastatin (CRESTOR) 20 MG tablet TAKE 1 TABLET BY MOUTH EVERY DAY.   sildenafil (REVATIO) 20 MG tablet TAKE 1 TABLET 3 TIMES A DAY AS NEEDED   traZODone (DESYREL) 100 MG tablet TAKE ONE TABLET BY MOUTH AT BEDTIME AS NEEDED FOR SLEEP   vitamin B-12 (CYANOCOBALAMIN) 1000 MCG tablet Take 1,000 mcg by mouth daily.   No facility-administered medications prior to visit.    Review of Systems  Constitutional:  Negative for appetite change, chills and fever.  Respiratory:  Negative for chest tightness, shortness of breath and wheezing.   Cardiovascular:  Negative for chest pain and palpitations.  Gastrointestinal:  Negative for abdominal pain, nausea and vomiting.    Last hemoglobin A1c Lab Results  Component Value Date   HGBA1C 7.2 (A) 08/27/2021       Objective    BP 112/68 (BP Location: Right Arm, Patient Position: Sitting, Cuff Size: Normal)   Pulse 62   Temp 97.7 F (36.5 C) (Oral)   Wt 169 lb (76.7 kg)   SpO2  97%   BMI 24.96 kg/m  BP Readings from Last 3 Encounters:  08/27/21 112/68  04/25/21 108/74  03/05/21 128/68   Wt Readings from Last 3 Encounters:  08/27/21 169 lb (76.7 kg)  04/25/21 166 lb (75.3 kg)  03/05/21 169 lb 3.2 oz (76.7 kg)      Physical Exam Vitals reviewed.  Constitutional:      Appearance: He is well-developed.  HENT:     Head: Normocephalic and atraumatic.     Right Ear: External ear normal.     Left Ear: External ear normal.     Nose: Nose normal.  Eyes:     Conjunctiva/sclera: Conjunctivae normal.  Neck:     Thyroid: No thyromegaly.  Cardiovascular:     Rate and Rhythm: Normal rate and regular rhythm.     Heart sounds: Normal heart sounds.  Pulmonary:     Effort: Pulmonary effort is normal.     Breath sounds: Normal breath sounds.  Abdominal:     Palpations: Abdomen is soft.  Skin:    General: Skin is warm and dry.  Neurological:     Mental Status: He is alert and oriented to person, place, and time.  Psychiatric:        Behavior: Behavior normal.        Thought Content: Thought content normal.        Judgment: Judgment normal.      Diabetic Foot Exam - Simple   Simple Foot Form Visual Inspection No deformities, no ulcerations, no other skin breakdown bilaterally: Yes Sensation Testing Intact to touch and monofilament testing bilaterally: Yes Pulse Check Posterior Tibialis and Dorsalis pulse intact bilaterally: Yes Comments      Results for orders placed or performed in visit on 08/27/21  POCT glycosylated hemoglobin (Hb A1C)  Result Value Ref Range   Hemoglobin A1C 7.2 (A) 4.0 - 5.6 %   HbA1c POC (<> result, manual entry)     HbA1c, POC (prediabetic range)     HbA1c, POC (controlled diabetic range)      Assessment & Plan     1. Type 2 diabetes mellitus without complication, without long-term current use of insulin (HCC) A1c is 7.2 under good control.  Continue Jardiance but have instructed him to clean around his foreskin and  dry after each voiding episode. - POCT glycosylated hemoglobin (Hb A1C)  2. Hyperlipidemia, unspecified hyperlipidemia type Goal LDL less than 70  3. Chronic atrial fibrillation (HCC) On Xarelto  4. Primary hypertension Good control.  5. Mixed hyperlipidemia On rosuvastatin 20  6. Chronic low back pain, unspecified back pain laterality, unspecified whether sciatica present Stable presently.   No follow-ups on file.      I, Wilhemena Durie, MD, have reviewed all documentation for this visit. The documentation on 08/27/21 for the exam, diagnosis, procedures, and orders are all accurate and complete.    Montrice Montuori Cranford Mon, MD  [facility] [phone] (phone) [phone] (fax)  [place]

## 2021-08-27 ENCOUNTER — Ambulatory Visit (INDEPENDENT_AMBULATORY_CARE_PROVIDER_SITE_OTHER): Payer: PPO | Admitting: Family Medicine

## 2021-08-27 VITALS — BP 112/68 | HR 62 | Temp 97.7°F | Wt 169.0 lb

## 2021-08-27 DIAGNOSIS — G8929 Other chronic pain: Secondary | ICD-10-CM | POA: Diagnosis not present

## 2021-08-27 DIAGNOSIS — I10 Essential (primary) hypertension: Secondary | ICD-10-CM

## 2021-08-27 DIAGNOSIS — E119 Type 2 diabetes mellitus without complications: Secondary | ICD-10-CM

## 2021-08-27 DIAGNOSIS — I482 Chronic atrial fibrillation, unspecified: Secondary | ICD-10-CM

## 2021-08-27 DIAGNOSIS — E782 Mixed hyperlipidemia: Secondary | ICD-10-CM

## 2021-08-27 DIAGNOSIS — E785 Hyperlipidemia, unspecified: Secondary | ICD-10-CM

## 2021-08-27 DIAGNOSIS — M545 Low back pain, unspecified: Secondary | ICD-10-CM | POA: Diagnosis not present

## 2021-08-27 LAB — POCT GLYCOSYLATED HEMOGLOBIN (HGB A1C): Hemoglobin A1C: 7.2 % — AB (ref 4.0–5.6)

## 2021-08-29 DIAGNOSIS — M1811 Unilateral primary osteoarthritis of first carpometacarpal joint, right hand: Secondary | ICD-10-CM | POA: Diagnosis not present

## 2021-08-29 DIAGNOSIS — M1812 Unilateral primary osteoarthritis of first carpometacarpal joint, left hand: Secondary | ICD-10-CM | POA: Diagnosis not present

## 2021-08-29 DIAGNOSIS — M180 Bilateral primary osteoarthritis of first carpometacarpal joints: Secondary | ICD-10-CM | POA: Diagnosis not present

## 2021-08-30 DIAGNOSIS — M5431 Sciatica, right side: Secondary | ICD-10-CM | POA: Diagnosis not present

## 2021-08-30 DIAGNOSIS — M9903 Segmental and somatic dysfunction of lumbar region: Secondary | ICD-10-CM | POA: Diagnosis not present

## 2021-08-30 DIAGNOSIS — M9905 Segmental and somatic dysfunction of pelvic region: Secondary | ICD-10-CM | POA: Diagnosis not present

## 2021-08-30 DIAGNOSIS — M5416 Radiculopathy, lumbar region: Secondary | ICD-10-CM | POA: Diagnosis not present

## 2021-09-10 ENCOUNTER — Other Ambulatory Visit: Payer: Self-pay | Admitting: Family Medicine

## 2021-09-11 NOTE — Telephone Encounter (Signed)
Requested Prescriptions  Pending Prescriptions Disp Refills  . allopurinol (ZYLOPRIM) 300 MG tablet [Pharmacy Med Name: ALLOPURINOL 300 MG TAB] 180 tablet 3    Sig: TAKE 1 TABLET BY MOUTH TWICE DAILY     Endocrinology:  Gout Agents - allopurinol Failed - 09/10/2021 12:48 PM      Failed - Uric Acid in normal range and within 360 days    Uric Acid  Date Value Ref Range Status  02/13/2017 3.7 3.7 - 8.6 mg/dL Final    Comment:               Therapeutic target for gout patients: <6.0         Passed - Cr in normal range and within 360 days    Creatinine, Ser  Date Value Ref Range Status  04/25/2021 1.01 0.76 - 1.27 mg/dL Final         Passed - Valid encounter within last 12 months    Recent Outpatient Visits          2 weeks ago Type 2 diabetes mellitus without complication, without long-term current use of insulin ([place])   [facility] Jerrol Banana., MD   4 months ago Type 2 diabetes mellitus without complication, without long-term current use of insulin ([place])   [facility] Jerrol Banana., MD   10 months ago Type 2 diabetes mellitus without complication, without long-term current use of insulin ([place])   [facility] Jerrol Banana., MD   1 year ago Type 2 diabetes mellitus without complication, without long-term current use of insulin [facility])   [facility] Jerrol Banana., MD   1 year ago Annual physical exam   [facility] Jerrol Banana., MD      Future Appointments            In 3 months Jerrol Banana., MD [facility]   In 7 months Jerrol Banana., MD [facility]           Passed - CBC within normal limits and completed in the last 12 months    WBC  Date Value Ref Range Status  04/25/2021 8.1 3.4 - 10.8 x10E3/uL Final  01/07/2009 10.0 4.0 - 10.5 K/uL Final   RBC  Date Value Ref Range Status  04/25/2021 5.60  4.14 - 5.80 x10E6/uL Final  01/07/2009 4.57 4.22 - 5.81 MIL/uL Final   Hemoglobin  Date Value Ref Range Status  04/25/2021 16.1 13.0 - 17.7 g/dL Final   Hematocrit  Date Value Ref Range Status  04/25/2021 46.6 37.5 - 51.0 % Final   MCHC  Date Value Ref Range Status  04/25/2021 34.5 31.5 - 35.7 g/dL Final  01/07/2009 34.9 30.0 - 36.0 g/dL Final   [facility]  Date Value Ref Range Status  04/25/2021 28.8 26.6 - 33.0 pg Final   MCV  Date Value Ref Range Status  04/25/2021 83 79 - 97 fL Final   No results found for: "PLTCOUNTKUC", "LABPLAT", "POCPLA" RDW  Date Value Ref Range Status  04/25/2021 14.4 11.6 - 15.4 % Final

## 2021-09-12 ENCOUNTER — Ambulatory Visit: Payer: PPO | Admitting: Dermatology

## 2021-09-12 ENCOUNTER — Encounter: Payer: Self-pay | Admitting: Dermatology

## 2021-09-12 DIAGNOSIS — Z1283 Encounter for screening for malignant neoplasm of skin: Secondary | ICD-10-CM | POA: Diagnosis not present

## 2021-09-12 DIAGNOSIS — D229 Melanocytic nevi, unspecified: Secondary | ICD-10-CM | POA: Diagnosis not present

## 2021-09-12 DIAGNOSIS — L578 Other skin changes due to chronic exposure to nonionizing radiation: Secondary | ICD-10-CM

## 2021-09-12 DIAGNOSIS — D1800 Hemangioma unspecified site: Secondary | ICD-10-CM

## 2021-09-12 DIAGNOSIS — L814 Other melanin hyperpigmentation: Secondary | ICD-10-CM

## 2021-09-12 DIAGNOSIS — L570 Actinic keratosis: Secondary | ICD-10-CM | POA: Diagnosis not present

## 2021-09-12 DIAGNOSIS — Z86007 Personal history of in-situ neoplasm of skin: Secondary | ICD-10-CM | POA: Diagnosis not present

## 2021-09-12 DIAGNOSIS — D692 Other nonthrombocytopenic purpura: Secondary | ICD-10-CM

## 2021-09-12 DIAGNOSIS — L821 Other seborrheic keratosis: Secondary | ICD-10-CM | POA: Diagnosis not present

## 2021-09-12 NOTE — Patient Instructions (Addendum)
Cryotherapy Aftercare  Wash gently with soap and water everyday.   Apply Vaseline and Band-Aid daily until healed.     Due to recent changes in healthcare laws, you may see results of your pathology and/or laboratory studies on MyChart before the doctors have had a chance to review them. We understand that in some cases there may be results that are confusing or concerning to you. Please understand that not all results are received at the same time and often the doctors may need to interpret multiple results in order to provide you with the best plan of care or course of treatment. Therefore, we ask that you please give us 2 business days to thoroughly review all your results before contacting the office for clarification. Should we see a critical lab result, you will be contacted sooner.   If You Need Anything After Your Visit  If you have any questions or concerns for your doctor, please call our main line at [phone] and press option 4 to reach your doctor's medical assistant. If no one answers, please leave a voicemail as directed and we will return your call as soon as possible. Messages left after 4 pm will be answered the following business day.   You may also send us a message via MyChart. We typically respond to MyChart messages within 1-2 business days.  For prescription refills, please ask your pharmacy to contact our office. Our fax number is [phone].  If you have an urgent issue when the clinic is closed that cannot wait until the next business day, you can page your doctor at the number below.    Please note that while we do our best to be available for urgent issues outside of office hours, we are not available 24/7.   If you have an urgent issue and are unable to reach us, you may choose to seek medical care at your doctor's office, retail clinic, urgent care center, or emergency room.  If you have a medical emergency, please immediately call 911 or go to the  emergency department.  Pager Numbers  - Dr. Kowalski: [phone]  - Dr. Moye: [phone]  - Dr. Stewart: [phone]  In the event of inclement weather, please call our main line at [phone] for an update on the status of any delays or closures.  Dermatology Medication Tips: Please keep the boxes that topical medications come in in order to help keep track of the instructions about where and how to use these. Pharmacies typically print the medication instructions only on the boxes and not directly on the medication tubes.   If your medication is too expensive, please contact our office at [phone] option 4 or send us a message through MyChart.   We are unable to tell what your co-pay for medications will be in advance as this is different depending on your insurance coverage. However, we may be able to find a substitute medication at lower cost or fill out paperwork to get insurance to cover a needed medication.   If a prior authorization is required to get your medication covered by your insurance company, please allow us 1-2 business days to complete this process.  Drug prices often vary depending on where the prescription is filled and some pharmacies may offer cheaper prices.  The website www.goodrx.com contains coupons for medications through different pharmacies. The prices here do not account for what the cost may be with help from insurance (it may be cheaper with your insurance), but the website can   give you the price if you did not use any insurance.  - You can print the associated coupon and take it with your prescription to the pharmacy.  - You may also stop by our office during regular business hours and pick up a GoodRx coupon card.  - If you need your prescription sent electronically to a different pharmacy, notify our office through [facility] MyChart or by phone at [phone] option 4.     Si Usted Necesita Algo Despus de Su Visita  Tambin puede  enviarnos un mensaje a travs de MyChart. Por lo general respondemos a los mensajes de MyChart en el transcurso de 1 a 2 das hbiles.  Para renovar recetas, por favor pida a su farmacia que se ponga en contacto con nuestra oficina. Nuestro nmero de fax es el [phone].  Si tiene un asunto urgente cuando la clnica est cerrada y que no puede esperar hasta el siguiente da hbil, puede llamar/localizar a su doctor(a) al nmero que aparece a continuacin.   Por favor, tenga en cuenta que aunque hacemos todo lo posible para estar disponibles para asuntos urgentes fuera del horario de oficina, no estamos disponibles las 24 horas del da, los 7 das de la semana.   Si tiene un problema urgente y no puede comunicarse con nosotros, puede optar por buscar atencin mdica  en el consultorio de su doctor(a), en una clnica privada, en un centro de atencin urgente o en una sala de emergencias.  Si tiene una emergencia mdica, por favor llame inmediatamente al 911 o vaya a la sala de emergencias.  Nmeros de bper  - Dr. Kowalski: [phone]  - Dra. Moye: [phone]  - Dra. Stewart: [phone]  En caso de inclemencias del tiempo, por favor llame a nuestra lnea principal al [phone] para una actualizacin sobre el estado de cualquier retraso o cierre.  Consejos para la medicacin en dermatologa: Por favor, guarde las cajas en las que vienen los medicamentos de uso tpico para ayudarle a seguir las instrucciones sobre dnde y cmo usarlos. Las farmacias generalmente imprimen las instrucciones del medicamento slo en las cajas y no directamente en los tubos del medicamento.   Si su medicamento es muy caro, por favor, pngase en contacto con nuestra oficina llamando al [phone] y presione la opcin 4 o envenos un mensaje a travs de MyChart.   No podemos decirle cul ser su copago por los medicamentos por adelantado ya que esto es diferente dependiendo de la cobertura de su seguro.  Sin embargo, es posible que podamos encontrar un medicamento sustituto a menor costo o llenar un formulario para que el seguro cubra el medicamento que se considera necesario.   Si se requiere una autorizacin previa para que su compaa de seguros cubra su medicamento, por favor permtanos de 1 a 2 das hbiles para completar este proceso.  Los precios de los medicamentos varan con frecuencia dependiendo del lugar de dnde se surte la receta y alguna farmacias pueden ofrecer precios ms baratos.  El sitio web www.goodrx.com tiene cupones para medicamentos de diferentes farmacias. Los precios aqu no tienen en cuenta lo que podra costar con la ayuda del seguro (puede ser ms barato con su seguro), pero el sitio web puede darle el precio si no utiliz ningn seguro.  - Puede imprimir el cupn correspondiente y llevarlo con su receta a la farmacia.  - Tambin puede pasar por nuestra oficina durante el horario de atencin regular y recoger una tarjeta de cupones de GoodRx.  -   Si necesita que su receta se enve electrnicamente a una farmacia diferente, informe a nuestra oficina a travs de MyChart de [place] o por telfono llamando al [phone] y presione la opcin 4.  

## 2021-09-12 NOTE — Progress Notes (Signed)
Follow-Up Visit   Subjective  Benjamin [name] is a 71 y.o. male who presents for the following: check spot (R ear, >30m tender) and Total body skin exam (Hx of SCC IS R lat neck post aspect, hx of AKs). The patient presents for Total-Body Skin Exam (TBSE) for skin cancer screening and mole check.  The patient has spots, moles and lesions to be evaluated, some may be new or changing and the patient has concerns that these could be cancer.  The following portions of the chart were reviewed this encounter and updated as appropriate:   Tobacco  Allergies  Meds  Problems  Med Hx  Surg Hx  Fam Hx     Review of Systems:  No other skin or systemic complaints except as noted in HPI or Assessment and Plan.  Objective  Well appearing patient in no apparent distress; mood and affect are within normal limits.  A full examination was performed including scalp, head, eyes, ears, nose, lips, neck, chest, axillae, abdomen, back, buttocks, bilateral upper extremities, bilateral lower extremities, hands, feet, fingers, toes, fingernails, and toenails. All findings within normal limits unless otherwise noted below.  face, ears x 8 (8) Pink scaly macules   Assessment & Plan   History of Squamous Cell Carcinoma in Situ of the Skin - No evidence of recurrence today - Recommend regular full body skin exams - Recommend daily broad spectrum sunscreen SPF 30+ to sun-exposed areas, reapply every 2 hours as needed.  - Call if any new or changing lesions are noted between office visits  - R lat neck post aspect  Lentigines - Scattered tan macules - Due to sun exposure - Benign-appearing, observe - Recommend daily broad spectrum sunscreen SPF 30+ to sun-exposed areas, reapply every 2 hours as needed. - Call for any changes  Seborrheic Keratoses - Stuck-on, waxy, tan-brown papules and/or plaques  - Benign-appearing - Discussed benign etiology and prognosis. - Observe - Call for any  changes  Melanocytic Nevi - Tan-brown and/or pink-flesh-colored symmetric macules and papules - Benign appearing on exam today - Observation - Call clinic for new or changing moles - Recommend daily use of broad spectrum spf 30+ sunscreen to sun-exposed areas.   Hemangiomas - Red papules - Discussed benign nature - Observe - Call for any changes  Actinic Damage - Chronic condition, secondary to cumulative UV/sun exposure - diffuse scaly erythematous macules with underlying dyspigmentation - Recommend daily broad spectrum sunscreen SPF 30+ to sun-exposed areas, reapply every 2 hours as needed.  - Staying in the shade or wearing long sleeves, sun glasses (UVA+UVB protection) and wide brim hats (4-inch brim around the entire circumference of the hat) are also recommended for sun protection.  - Call for new or changing lesions.  Skin cancer screening performed today.   AK (actinic keratosis) (8) face, ears x 8  Destruction of lesion - face, ears x 8 Complexity: simple   Destruction method: cryotherapy   Informed consent: discussed and consent obtained   Timeout:  patient name, date of birth, surgical site, and procedure verified Lesion destroyed using liquid nitrogen: Yes   Region frozen until ice ball extended beyond lesion: Yes   Outcome: patient tolerated procedure well with no complications   Post-procedure details: wound care instructions given    Purpura - Chronic; persistent and recurrent.  Treatable, but not curable. - Violaceous macules and patches - Benign - Related to trauma, age, sun damage and/or use of blood thinners, chronic use of topical and/or  oral steroids - Observe - Can use OTC arnica containing moisturizer such as Dermend Bruise Formula if desired - Call for worsening or other concerns   Return in about 1 year (around 09/13/2022) for TBSE, Hx of SCC IS, Hx of AKs.  I, Othelia Pulling, RMA, am acting as scribe for Sarina Ser, MD . Documentation: I have  reviewed the above documentation for accuracy and completeness, and I agree with the above.  Sarina Ser, MD

## 2021-10-01 ENCOUNTER — Encounter: Payer: PPO | Admitting: Dermatology

## 2021-10-10 ENCOUNTER — Other Ambulatory Visit: Payer: Self-pay | Admitting: Family Medicine

## 2021-10-10 DIAGNOSIS — E785 Hyperlipidemia, unspecified: Secondary | ICD-10-CM

## 2021-10-10 DIAGNOSIS — M5431 Sciatica, right side: Secondary | ICD-10-CM | POA: Diagnosis not present

## 2021-10-10 DIAGNOSIS — M5416 Radiculopathy, lumbar region: Secondary | ICD-10-CM | POA: Diagnosis not present

## 2021-10-10 DIAGNOSIS — M9903 Segmental and somatic dysfunction of lumbar region: Secondary | ICD-10-CM | POA: Diagnosis not present

## 2021-10-10 DIAGNOSIS — M9905 Segmental and somatic dysfunction of pelvic region: Secondary | ICD-10-CM | POA: Diagnosis not present

## 2021-11-07 DIAGNOSIS — M5431 Sciatica, right side: Secondary | ICD-10-CM | POA: Diagnosis not present

## 2021-11-07 DIAGNOSIS — M9903 Segmental and somatic dysfunction of lumbar region: Secondary | ICD-10-CM | POA: Diagnosis not present

## 2021-11-07 DIAGNOSIS — M9905 Segmental and somatic dysfunction of pelvic region: Secondary | ICD-10-CM | POA: Diagnosis not present

## 2021-11-07 DIAGNOSIS — M5416 Radiculopathy, lumbar region: Secondary | ICD-10-CM | POA: Diagnosis not present

## 2021-11-16 ENCOUNTER — Other Ambulatory Visit: Payer: Self-pay | Admitting: Family Medicine

## 2021-11-16 DIAGNOSIS — E1121 Type 2 diabetes mellitus with diabetic nephropathy: Secondary | ICD-10-CM

## 2021-11-16 NOTE — Telephone Encounter (Signed)
Requested medication (s) are due for refill today: yes  Requested medication (s) are on the active medication list: yes  Last refill:  11/27/20 #90 with 3 RF  Future visit scheduled: 01/07/22 and 05/08/22 (with Rosanna Randy)  Notes to clinic:  please assess.      Requested Prescriptions  Pending Prescriptions Disp Refills   losartan (COZAAR) 25 MG tablet [Pharmacy Med Name: LOSARTAN POTASSIUM 25 MG TAB] 90 tablet 2    Sig: TAKE 1 TABLET BY MOUTH DAILY     Cardiovascular:  Angiotensin Receptor Blockers Failed - 11/16/2021 10:52 AM      Failed - Cr in normal range and within 180 days    Creatinine, Ser  Date Value Ref Range Status  04/25/2021 1.01 0.76 - 1.27 mg/dL Final         Failed - K in normal range and within 180 days    Potassium  Date Value Ref Range Status  04/25/2021 4.3 3.5 - 5.2 mmol/L Final         Passed - Patient is not pregnant      Passed - Last BP in normal range    BP Readings from Last 1 Encounters:  08/27/21 112/68         Passed - Valid encounter within last 6 months    Recent Outpatient Visits           2 months ago Type 2 diabetes mellitus without complication, without long-term current use of insulin [facility])   [facility] Jerrol Banana., MD   6 months ago Type 2 diabetes mellitus without complication, without long-term current use of insulin [facility])   [facility] Jerrol Banana., MD   1 year ago Type 2 diabetes mellitus without complication, without long-term current use of insulin [facility])   [facility] Jerrol Banana., MD   1 year ago Type 2 diabetes mellitus without complication, without long-term current use of insulin [facility])   [facility] Jerrol Banana., MD   1 year ago Annual physical exam   [facility] Jerrol Banana., MD       Future Appointments             In 1 month Jerrol Banana., MD [facility] [place]    In 5 months Jerrol Banana., MD [facility] [place]   In 11 months Ralene Bathe, MD [place]

## 2021-12-05 DIAGNOSIS — M5416 Radiculopathy, lumbar region: Secondary | ICD-10-CM | POA: Diagnosis not present

## 2021-12-05 DIAGNOSIS — M5431 Sciatica, right side: Secondary | ICD-10-CM | POA: Diagnosis not present

## 2021-12-05 DIAGNOSIS — M9905 Segmental and somatic dysfunction of pelvic region: Secondary | ICD-10-CM | POA: Diagnosis not present

## 2021-12-05 DIAGNOSIS — M9903 Segmental and somatic dysfunction of lumbar region: Secondary | ICD-10-CM | POA: Diagnosis not present

## 2022-01-05 ENCOUNTER — Other Ambulatory Visit: Payer: Self-pay | Admitting: Family Medicine

## 2022-01-05 DIAGNOSIS — E119 Type 2 diabetes mellitus without complications: Secondary | ICD-10-CM

## 2022-01-07 ENCOUNTER — Ambulatory Visit: Payer: PPO | Admitting: Family Medicine

## 2022-01-07 ENCOUNTER — Telehealth: Payer: Self-pay | Admitting: Family Medicine

## 2022-01-07 DIAGNOSIS — E785 Hyperlipidemia, unspecified: Secondary | ICD-10-CM

## 2022-01-07 MED ORDER — ROSUVASTATIN CALCIUM 20 MG PO TABS: 20.0000 mg | ORAL_TABLET | Freq: Every day | ORAL | 0 refills | Status: AC

## 2022-01-07 NOTE — Telephone Encounter (Signed)
[facility] pharmacy faxed refill request for the following medications:    rosuvastatin (CRESTOR) 20 MG table   Please advise

## 2022-01-07 NOTE — Telephone Encounter (Signed)
Done

## 2022-01-08 NOTE — Telephone Encounter (Signed)
Requested medications are due for refill today.  yes  Requested medications are on the active medications list.  yes  Last refill.   Future visit scheduled.   no  Notes to clinic.  PT has scheduled NP appt with [facility] provider for January 2024. Dr. Rosanna Randy pt.    Requested Prescriptions  Pending Prescriptions Disp Refills   JARDIANCE 10 MG TABS tablet [Pharmacy Med Name: JARDIANCE 10 MG TAB] 90 tablet 3    Sig: TAKE 1 TABLET BY MOUTH DAILY     There is no refill protocol information for this order

## 2022-01-09 DIAGNOSIS — M9903 Segmental and somatic dysfunction of lumbar region: Secondary | ICD-10-CM | POA: Diagnosis not present

## 2022-01-09 DIAGNOSIS — M5431 Sciatica, right side: Secondary | ICD-10-CM | POA: Diagnosis not present

## 2022-01-09 DIAGNOSIS — M5416 Radiculopathy, lumbar region: Secondary | ICD-10-CM | POA: Diagnosis not present

## 2022-01-09 DIAGNOSIS — M9905 Segmental and somatic dysfunction of pelvic region: Secondary | ICD-10-CM | POA: Diagnosis not present

## 2022-01-11 ENCOUNTER — Other Ambulatory Visit: Payer: Self-pay | Admitting: Internal Medicine

## 2022-01-11 DIAGNOSIS — I482 Chronic atrial fibrillation, unspecified: Secondary | ICD-10-CM

## 2022-01-11 NOTE — Telephone Encounter (Signed)
Prescription refill request for Xarelto received.  Indication: Afib  Last office visit: 03/05/21 Charlcie Cradle)  Weight: 94.9kg Age: 71 Scr: 1.01 (04/25/21)  CrCl: 71.9m/min  Appropriate dose and refill sent to requested pharmacy.

## 2022-01-23 DIAGNOSIS — Z4789 Encounter for other orthopedic aftercare: Secondary | ICD-10-CM | POA: Diagnosis not present

## 2022-01-23 DIAGNOSIS — M180 Bilateral primary osteoarthritis of first carpometacarpal joints: Secondary | ICD-10-CM | POA: Diagnosis not present

## 2022-01-24 ENCOUNTER — Other Ambulatory Visit: Payer: Self-pay | Admitting: Family Medicine

## 2022-02-13 DIAGNOSIS — M5431 Sciatica, right side: Secondary | ICD-10-CM | POA: Diagnosis not present

## 2022-02-13 DIAGNOSIS — M9905 Segmental and somatic dysfunction of pelvic region: Secondary | ICD-10-CM | POA: Diagnosis not present

## 2022-02-13 DIAGNOSIS — M9903 Segmental and somatic dysfunction of lumbar region: Secondary | ICD-10-CM | POA: Diagnosis not present

## 2022-02-13 DIAGNOSIS — M5416 Radiculopathy, lumbar region: Secondary | ICD-10-CM | POA: Diagnosis not present

## 2022-03-06 DIAGNOSIS — M109 Gout, unspecified: Secondary | ICD-10-CM | POA: Diagnosis not present

## 2022-03-06 DIAGNOSIS — E7849 Other hyperlipidemia: Secondary | ICD-10-CM | POA: Diagnosis not present

## 2022-03-06 DIAGNOSIS — E119 Type 2 diabetes mellitus without complications: Secondary | ICD-10-CM | POA: Diagnosis not present

## 2022-03-06 DIAGNOSIS — I4891 Unspecified atrial fibrillation: Secondary | ICD-10-CM | POA: Diagnosis not present

## 2022-03-06 DIAGNOSIS — I10 Essential (primary) hypertension: Secondary | ICD-10-CM | POA: Diagnosis not present

## 2022-03-13 DIAGNOSIS — M9905 Segmental and somatic dysfunction of pelvic region: Secondary | ICD-10-CM | POA: Diagnosis not present

## 2022-03-13 DIAGNOSIS — M5431 Sciatica, right side: Secondary | ICD-10-CM | POA: Diagnosis not present

## 2022-03-13 DIAGNOSIS — M9903 Segmental and somatic dysfunction of lumbar region: Secondary | ICD-10-CM | POA: Diagnosis not present

## 2022-03-13 DIAGNOSIS — M5416 Radiculopathy, lumbar region: Secondary | ICD-10-CM | POA: Diagnosis not present

## 2022-03-18 DIAGNOSIS — H43813 Vitreous degeneration, bilateral: Secondary | ICD-10-CM | POA: Diagnosis not present

## 2022-03-18 DIAGNOSIS — H52203 Unspecified astigmatism, bilateral: Secondary | ICD-10-CM | POA: Diagnosis not present

## 2022-03-18 DIAGNOSIS — H2512 Age-related nuclear cataract, left eye: Secondary | ICD-10-CM | POA: Diagnosis not present

## 2022-03-18 DIAGNOSIS — E119 Type 2 diabetes mellitus without complications: Secondary | ICD-10-CM | POA: Diagnosis not present

## 2022-03-21 NOTE — Progress Notes (Deleted)
Electrophysiology Office Follow up Visit Note:    Date:  03/21/2022   ID:  Benjamin [name], DOB 1950-11-17, MRN [id number]  PCP:  Eulas Post, MD  [facility] Cardiologist:  Thompson Grayer, MD (Inactive)  [facility] Electrophysiologist:  Vickie Epley, MD    Interval History:    Benjamin [name] is a 72 y.o. male who presents for a follow up visit. They were last seen in clinic 03/05/2021 for pAF. Previously followed by Dr Rayann Heman. He takes xarelto for stroke ppx.        Past Medical History:  Diagnosis Date   Actinic keratosis    Atrial fibrillation (HCC)    paroxysmal   Cancer (HCC)    SCC removed from neck   Cough syncope    Diabetes mellitus    Dyslipidemia    Gout    Hyperlipidemia    Hypertension    Hypopotassemia    Squamous cell carcinoma of skin 10/31/2016   right lat neck post aspect (in situ)    Past Surgical History:  Procedure Laterality Date   COLONOSCOPY WITH PROPOFOL N/A 11/07/2016   Procedure: COLONOSCOPY WITH PROPOFOL;  Surgeon: Lollie Sails, MD;  Location: [facility] ENDOSCOPY;  Service: Endoscopy;  Laterality: N/A;   SHOULDER SURGERY Right 12/2020   TONSILLECTOMY AND ADENOIDECTOMY      Current Medications: No outpatient medications have been marked as taking for the 03/22/22 encounter (Appointment) with Vickie Epley, MD.     Allergies:   Patient has no known allergies.   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  He has 2 adult sons.  He exercises as stated above.  No tobacco or illicit substance use.  He does enjoy occasional beer and vodka, mixed drink on the weekends.  He tries to follow a low-cholesterol diet.          FAMILY HISTORY:  Mother is alive, she has a pacemaker secondary to heart block, she also has a stable abdominal aneurysm.  Father's health, interesting that he had an MI at age 21 and then apparently died in his sleep at age 80 that was felt to be secondary to MI.  He has 3 siblings who are alive and well.    Social Determinants of Health   Financial Resource Strain: Low Risk  (03/01/2021)   Overall Financial Resource Strain (CARDIA)    Difficulty of Paying Living Expenses: Not hard at all  Food Insecurity: No Food Insecurity (03/01/2021)  Hunger Vital Sign    Worried About Running Out of Food in the Last Year: Never true    Ran Out of Food in the Last Year: Never true  Transportation Needs: No Transportation Needs (03/01/2021)   PRAPARE - Hydrologist (Medical): No    Lack of Transportation (Non-Medical): No  Physical Activity: Sufficiently Active (03/01/2021)   Exercise Vital Sign    Days of Exercise per Week: 4 days    Minutes of Exercise per Session: 40 min  Stress: No Stress Concern Present (03/01/2021)   [place]    Feeling of Stress : Not at all  Social Connections: Moderately Integrated (03/01/2021)   Social Connection and Isolation Panel [NHANES]    Frequency of Communication with Friends and Family: More than three times a week    Frequency of Social Gatherings  with Friends and Family: Once a week    Attends Religious Services: More than 4 times per year    Active Member of [employer] or Organizations: No    Attends Music therapist: Never    Marital Status: Married     Family History: The patient's family history includes Aneurysm in his mother; Atrial fibrillation in his mother; CAD in his father; Congestive Heart Failure in his mother; Diabetes in his father; Gout in his father and son; Hearing loss in his son; Heart attack in his father; Hypertension in his father; Other in his son and son; Ovarian cancer in his mother; Ulcers in his father.  ROS:   Please see the history of present illness.    All other systems reviewed and are negative.  EKGs/Labs/Other Studies Reviewed:    The following studies were reviewed today:   Recent Labs: 04/25/2021: ALT 30; BUN 26; Creatinine, Ser 1.01; Hemoglobin 16.1; Platelets 276; Potassium 4.3; Sodium 139; TSH 2.170  Recent Lipid Panel    Component Value Date/Time   CHOL 155 04/25/2021 0929   TRIG 372 (H) 04/25/2021 0929   HDL 44 04/25/2021 0929   CHOLHDL 3.5 04/25/2021 0929   CHOLHDL 6.0 01/06/2009 0630   VLDL 56 (H) 01/06/2009 0630   LDLCALC 55 04/25/2021 0929    Physical Exam:    VS:  There were no vitals taken for this visit.    Wt Readings from Last 3 Encounters:  08/27/21 169 lb (76.7 kg)  04/25/21 166 lb (75.3 kg)  03/05/21 169 lb 3.2 oz (76.7 kg)     GEN: *** Well nourished, well developed in no acute distress CARDIAC: ***RRR, no murmurs, rubs, gallops        ASSESSMENT:    No diagnosis found. PLAN:    In order of problems listed above:  #pAF Maintaining sinus rhythm. Continue xarelto.  #Hypertension *** goal today.  Recommend checking blood pressures 1-2 times per week at home and recording the values.  Recommend bringing these recordings to the primary care physician.  Follow up 1 year with APP.      Medication Adjustments/Labs and Tests  Ordered: Current medicines are reviewed at length with the patient today.  Concerns regarding medicines are outlined above.  No orders of the defined types were placed in this encounter.  No orders of the defined types were placed in this encounter.    Signed, Lars Mage, MD, [facility], [facility] 03/21/2022 8:55 PM    Electrophysiology [facility]

## 2022-03-22 ENCOUNTER — Ambulatory Visit: Payer: PPO | Attending: Cardiology | Admitting: Cardiology

## 2022-03-22 ENCOUNTER — Encounter: Payer: Self-pay | Admitting: Cardiology

## 2022-03-22 VITALS — BP 112/58 | HR 66 | Ht 69.0 in | Wt 168.0 lb

## 2022-03-22 DIAGNOSIS — I10 Essential (primary) hypertension: Secondary | ICD-10-CM

## 2022-03-22 DIAGNOSIS — I480 Paroxysmal atrial fibrillation: Secondary | ICD-10-CM

## 2022-03-22 NOTE — Patient Instructions (Addendum)
Medication Instructions:  Your physician recommends that you continue on your current medications as directed. Please refer to the Current Medication list given to you today.  *If you need a refill on your cardiac medications before your next appointment, please call your pharmacy*  Lab Work: BMET and CBC on May 13th, anytime between 7:15am and 5:00pm. You will meet with April when you come in for labs to review procedure instructions  Testing/Procedures: Your physician has requested that you have cardiac CT. Cardiac computed tomography (CT) is a painless test that uses an x-ray machine to take clear, detailed pictures of your heart. We will call you to schedule.   Your physician has recommended that you have an ablation. Catheter ablation is a medical procedure used to treat some cardiac arrhythmias (irregular heartbeats). During catheter ablation, a long, thin, flexible tube is put into a blood vessel in your groin (upper thigh), or neck. This tube is called an ablation catheter. It is then guided to your heart through the blood vessel. Radio frequency waves destroy small areas of heart tissue where abnormal heartbeats may cause an arrhythmia to start. Please see the instruction sheet given to you today. You are scheduled for June 3rd at 10:30am. Please arrive at [facility] Main Entrance A at 8:30am.  Follow-Up: At [facility], you and your health needs are our priority.  As part of our continuing mission to provide you with exceptional heart care, we have created designated Provider Care Teams.  These Care Teams include your primary Cardiologist (physician) and Advanced Practice Providers (APPs -  Physician Assistants and Nurse Practitioners) who all work together to provide you with the care you need, when you need it.  Your next appointment:   We will call you to arrange your follow up appointments

## 2022-03-22 NOTE — Progress Notes (Signed)
Electrophysiology Office Follow up Visit Note:    Date:  03/22/2022   ID:  Benjamin [name], DOB 1950-07-19, MRN [id number]  PCP:  Leonel Ramsay, MD  [facility] Cardiologist:  Thompson Grayer, MD (Inactive)  [facility] Electrophysiologist:  Vickie Epley, MD    Interval History:    Benjamin [name] is a 72 y.o. male who presents for a follow up visit. They were last seen in clinic 03/05/2021 for pAF. Previously followed by Dr Rayann Heman. He takes xarelto for stroke ppx.   Today, he is feeling overall well. He reports having multiple episodes of atrial fibrillation daily.  He experiences palpitations when he has atrial fibrillation.  He is still able to accomplish what he needs to during his day.  He uses an apple watch that sends him alerts. He notices that it tends to happen after exercise. He works with a Clinical research associate, mainly doing Charity fundraiser. He denies any knowledge of snoring.   He is compliant with Xarelto, 20 mg daily.  He denies any palpitations, chest pain, shortness of breath, or peripheral edema. No lightheadedness, headaches, syncope, orthopnea, or PND.  Past Medical History:  Diagnosis Date   Actinic keratosis    Atrial fibrillation (HCC)    paroxysmal   Cancer (HCC)    SCC removed from neck   Cough syncope    Diabetes mellitus    Dyslipidemia    Gout    Hyperlipidemia    Hypertension    Hypopotassemia    Squamous cell carcinoma of skin 10/31/2016   right lat neck post aspect (in situ)    Past Surgical History:  Procedure Laterality Date   COLONOSCOPY WITH PROPOFOL N/A 11/07/2016   Procedure: COLONOSCOPY WITH PROPOFOL;  Surgeon: Lollie Sails, MD;  [facility]: [facility] ENDOSCOPY;  Service: Endoscopy;  Laterality: N/A;   SHOULDER SURGERY Right 12/2020   TONSILLECTOMY AND ADENOIDECTOMY      Current Medications: Current Meds  Medication Sig   allopurinol (ZYLOPRIM) 300 MG tablet TAKE 1 TABLET BY MOUTH TWICE DAILY   Cholecalciferol (VITAMIN D3)  2000 UNITS TABS Take 1 tablet by mouth daily.   glucose blood (ONETOUCH VERIO) test strip CHECK BLOOD SUGAR DAILY   JARDIANCE 10 MG TABS tablet TAKE 1 TABLET BY MOUTH DAILY (Patient taking differently: Take 25 mg by mouth daily.)   losartan (COZAAR) 25 MG tablet TAKE 1 TABLET BY MOUTH DAILY   metFORMIN (GLUCOPHAGE-XR) 500 MG 24 hr tablet TAKE TWO TABLETS TWICE DAILY   metoprolol tartrate (LOPRESSOR) 50 MG tablet Take one-half tablet (25 mg) by mouth twice daily as needed for palpitations and/or fast heart rates   Multiple Vitamin (MULTIVITAMIN PO) Take by mouth.   rosuvastatin (CRESTOR) 20 MG tablet Take 1 tablet (20 mg total) by mouth daily.   sildenafil (REVATIO) 20 MG tablet TAKE 1 TABLET 3 TIMES A DAY AS NEEDED   traZODone (DESYREL) 100 MG tablet TAKE ONE TABLET BY MOUTH AT BEDTIME AS NEEDED FOR SLEEP   vitamin B-12 (CYANOCOBALAMIN) 1000 MCG tablet Take 1,000 mcg by mouth daily.   XARELTO 20 MG TABS tablet TAKE 1 TABLET BY MOUTH DAILY WITH SUPPER     Allergies:   Patient has no known allergies.   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  He has 2 adult sons.  He  exercises as stated above.  No tobacco or illicit substance use.  He does enjoy occasional beer and vodka, mixed drink on the weekends.  He tries to follow a low-cholesterol diet.          FAMILY HISTORY:  Mother is alive, she has a pacemaker secondary to heart block, she also has a stable abdominal aneurysm.  Father's health, interesting that he had an MI at age 23 and then apparently died in his sleep at age 8 that was felt to be secondary to MI.  He has 3 siblings who are alive and well.    Social Determinants of Health   Financial Resource Strain: Low Risk  (03/01/2021)   Overall Financial Resource Strain (CARDIA)    Difficulty of Paying Living Expenses: Not hard at all  Food Insecurity: No Food Insecurity (03/01/2021)   Hunger Vital Sign    Worried About Running Out of Food in the Last Year: Never true    Ran Out of Food in the Last Year: Never true  Transportation Needs: No Transportation Needs (03/01/2021)   PRAPARE - Hydrologist (Medical): No    Lack of Transportation (Non-Medical): No  Physical Activity: Sufficiently Active (03/01/2021)   Exercise Vital Sign    Days of Exercise per Week: 4 days    Minutes of Exercise per Session: 40 min  Stress: No Stress Concern Present (03/01/2021)   [place]    Feeling of Stress : Not at all  Social Connections: Moderately Integrated (03/01/2021)   Social Connection and Isolation Panel [NHANES]    Frequency of Communication with Friends and Family: More than three times a week    Frequency of Social Gatherings with Friends and Family: Once a week    Attends Religious Services: More than 4 times per year    Active Member of [employer] or Organizations: No    Attends Music therapist: Never    Marital Status: Married     Family History: The patient's family history includes Aneurysm in his mother; Atrial fibrillation in his mother; CAD in  his father; Congestive Heart Failure in his mother; Diabetes in his father; Gout in his father and son; Hearing loss in his son; Heart attack in his father; Hypertension in his father; Other in his son and son; Ovarian cancer in his mother; Ulcers in his father.  ROS:   Please see the history of present illness.   All other systems reviewed and are negative.  EKGs/Labs/Other Studies Reviewed:    The following studies were reviewed today:   Recent Labs: 04/25/2021: ALT 30; BUN 26;  Creatinine, Ser 1.01; Hemoglobin 16.1; Platelets 276; Potassium 4.3; Sodium 139; TSH 2.170  Recent Lipid Panel    Component Value Date/Time   CHOL 155 04/25/2021 0929   TRIG 372 (H) 04/25/2021 0929   HDL 44 04/25/2021 0929   CHOLHDL 3.5 04/25/2021 0929   CHOLHDL 6.0 01/06/2009 0630   VLDL 56 (H) 01/06/2009 0630   LDLCALC 55 04/25/2021 0929    Physical Exam:    VS:  BP (!) 112/58   Pulse 66   Ht 5' 9"$  (1.753 m)   Wt 168 lb (76.2 kg)   SpO2 97%   BMI 24.81 kg/m     Wt Readings from Last 3 Encounters:  03/22/22 168 lb (76.2 kg)  08/27/21 169 lb (76.7 kg)  04/25/21 166 lb (75.3 kg)     GEN:  Well nourished, well developed in no acute distress CARDIAC: RRR, no murmurs, rubs, gallops        ASSESSMENT:    1. Paroxysmal atrial fibrillation (HCC)   2. Primary hypertension    PLAN:    In order of problems listed above:  #pAF Maintaining sinus rhythm. Continue xarelto.  Discussed treatment options today for their AF including antiarrhythmic drug therapy and ablation. Discussed risks, recovery and likelihood of success. Discussed potential need for repeat ablation procedures and antiarrhythmic drugs after an initial ablation. They wish to proceed with scheduling.  Risk, benefits, and alternatives to EP study and radiofrequency ablation for afib were also discussed in detail today. These risks include but are not limited to stroke, bleeding, vascular damage, tamponade, perforation, damage  to the esophagus, lungs, and other structures, pulmonary vein stenosis, worsening renal function, and death. The patient understands these risk and wishes to proceed.  We will therefore proceed with catheter ablation at the next available time.  Carto, ICE, anesthesia are requested for the procedure.  Will also obtain CT PV protocol prior to the procedure to exclude LAA thrombus and further evaluate atrial anatomy.  #Hypertension At goal today.  Recommend checking blood pressures 1-2 times per week at home and recording the values.  Recommend bringing these recordings to the primary care physician.      Medication Adjustments/Labs and Tests Ordered: Current medicines are reviewed at length with the patient today.  Concerns regarding medicines are outlined above.  No orders of the defined types were placed in this encounter.  No orders of the defined types were placed in this encounter.  I,Rachel Rivera,acting as a scribe for Vickie Epley, MD.,have documented all relevant documentation on the behalf of Vickie Epley, MD,as directed by  Vickie Epley, MD while in the presence of Vickie Epley, MD.  Signed, Lars Mage, MD, [facility], [facility] 03/22/2022 8:14 AM    Electrophysiology [place]

## 2022-04-10 DIAGNOSIS — M9905 Segmental and somatic dysfunction of pelvic region: Secondary | ICD-10-CM | POA: Diagnosis not present

## 2022-04-10 DIAGNOSIS — M9903 Segmental and somatic dysfunction of lumbar region: Secondary | ICD-10-CM | POA: Diagnosis not present

## 2022-04-10 DIAGNOSIS — M5416 Radiculopathy, lumbar region: Secondary | ICD-10-CM | POA: Diagnosis not present

## 2022-04-10 DIAGNOSIS — M5431 Sciatica, right side: Secondary | ICD-10-CM | POA: Diagnosis not present

## 2022-04-18 ENCOUNTER — Encounter: Payer: Self-pay | Admitting: Cardiology

## 2022-04-24 ENCOUNTER — Other Ambulatory Visit: Payer: Self-pay | Admitting: Physician Assistant

## 2022-04-24 DIAGNOSIS — N529 Male erectile dysfunction, unspecified: Secondary | ICD-10-CM

## 2022-05-08 ENCOUNTER — Encounter: Payer: PPO | Admitting: Family Medicine

## 2022-05-08 DIAGNOSIS — M5416 Radiculopathy, lumbar region: Secondary | ICD-10-CM | POA: Diagnosis not present

## 2022-05-08 DIAGNOSIS — M9903 Segmental and somatic dysfunction of lumbar region: Secondary | ICD-10-CM | POA: Diagnosis not present

## 2022-05-08 DIAGNOSIS — M5431 Sciatica, right side: Secondary | ICD-10-CM | POA: Diagnosis not present

## 2022-05-08 DIAGNOSIS — M9905 Segmental and somatic dysfunction of pelvic region: Secondary | ICD-10-CM | POA: Diagnosis not present

## 2022-06-03 DIAGNOSIS — E119 Type 2 diabetes mellitus without complications: Secondary | ICD-10-CM | POA: Diagnosis not present

## 2022-06-03 DIAGNOSIS — I4891 Unspecified atrial fibrillation: Secondary | ICD-10-CM | POA: Diagnosis not present

## 2022-06-03 DIAGNOSIS — E7849 Other hyperlipidemia: Secondary | ICD-10-CM | POA: Diagnosis not present

## 2022-06-03 DIAGNOSIS — M109 Gout, unspecified: Secondary | ICD-10-CM | POA: Diagnosis not present

## 2022-06-03 DIAGNOSIS — Z0000 Encounter for general adult medical examination without abnormal findings: Secondary | ICD-10-CM | POA: Diagnosis not present

## 2022-06-03 DIAGNOSIS — I10 Essential (primary) hypertension: Secondary | ICD-10-CM | POA: Diagnosis not present

## 2022-06-06 DIAGNOSIS — R6889 Other general symptoms and signs: Secondary | ICD-10-CM | POA: Diagnosis not present

## 2022-06-14 DIAGNOSIS — E7849 Other hyperlipidemia: Secondary | ICD-10-CM | POA: Diagnosis not present

## 2022-06-14 DIAGNOSIS — M109 Gout, unspecified: Secondary | ICD-10-CM | POA: Diagnosis not present

## 2022-06-14 DIAGNOSIS — Z0000 Encounter for general adult medical examination without abnormal findings: Secondary | ICD-10-CM | POA: Diagnosis not present

## 2022-06-14 DIAGNOSIS — I4891 Unspecified atrial fibrillation: Secondary | ICD-10-CM | POA: Diagnosis not present

## 2022-06-14 DIAGNOSIS — E119 Type 2 diabetes mellitus without complications: Secondary | ICD-10-CM | POA: Diagnosis not present

## 2022-06-14 DIAGNOSIS — I10 Essential (primary) hypertension: Secondary | ICD-10-CM | POA: Diagnosis not present

## 2022-06-19 ENCOUNTER — Encounter: Payer: Self-pay | Admitting: Cardiology

## 2022-06-24 ENCOUNTER — Ambulatory Visit: Payer: PPO | Attending: Cardiology

## 2022-06-24 DIAGNOSIS — I10 Essential (primary) hypertension: Secondary | ICD-10-CM

## 2022-06-24 DIAGNOSIS — I480 Paroxysmal atrial fibrillation: Secondary | ICD-10-CM | POA: Diagnosis not present

## 2022-06-25 LAB — BASIC METABOLIC PANEL
BUN/Creatinine Ratio: 27 — ABNORMAL HIGH (ref 10–24)
BUN: 31 mg/dL — ABNORMAL HIGH (ref 8–27)
CO2: 22 mmol/L (ref 20–29)
Calcium: 9.5 mg/dL (ref 8.6–10.2)
Chloride: 102 mmol/L (ref 96–106)
Creatinine, Ser: 1.16 mg/dL (ref 0.76–1.27)
Glucose: 162 mg/dL — ABNORMAL HIGH (ref 70–99)
Potassium: 4.2 mmol/L (ref 3.5–5.2)
Sodium: 139 mmol/L (ref 134–144)
eGFR: 67 mL/min/{1.73_m2} (ref >59)

## 2022-06-25 LAB — CBC WITH DIFFERENTIAL/PLATELET
Basophils Absolute: 0.1 10*3/uL (ref 0.0–0.2)
Basos: 1 %
EOS (ABSOLUTE): 0.2 10*3/uL (ref 0.0–0.4)
Eos: 2 %
Hematocrit: 45 % (ref 37.5–51.0)
Hemoglobin: 15.3 g/dL (ref 13.0–17.7)
Immature Grans (Abs): 0 10*3/uL (ref 0.0–0.1)
Immature Granulocytes: 0 %
Lymphocytes Absolute: 2.4 10*3/uL (ref 0.7–3.1)
Lymphs: 27 %
MCH: 29.4 pg (ref 26.6–33.0)
MCHC: 34 g/dL (ref 31.5–35.7)
MCV: 86 fL (ref 79–97)
Monocytes Absolute: 0.9 10*3/uL (ref 0.1–0.9)
Monocytes: 10 %
Neutrophils Absolute: 5.4 10*3/uL (ref 1.4–7.0)
Neutrophils: 60 %
Platelets: 298 10*3/uL (ref 150–450)
RBC: 5.21 x10E6/uL (ref 4.14–5.80)
RDW: 13.4 % (ref 11.6–15.4)
WBC: 9 10*3/uL (ref 3.4–10.8)

## 2022-07-05 ENCOUNTER — Telehealth (HOSPITAL_COMMUNITY): Payer: Self-pay | Admitting: *Deleted

## 2022-07-05 NOTE — Telephone Encounter (Signed)
Reaching out to patient to offer assistance regarding upcoming cardiac imaging study; pt verbalizes understanding of appt date/time, parking situation and where to check in, pre-test NPO status, and verified current allergies; name and call back number provided for further questions should they arise  Megan Hayduk RN Navigator Cardiac Imaging [facility] [phone] office [phone] cell  

## 2022-07-08 NOTE — Pre-Procedure Instructions (Signed)
Patient takes Jardiance,  Left voice mail to not take this drug on Friday 5/31, Saturday 6/1 or Sunday 6/2 in preparation for procedure on Monday 6/3,  This medications has to be held for anesthesia cases.

## 2022-07-09 ENCOUNTER — Encounter (HOSPITAL_BASED_OUTPATIENT_CLINIC_OR_DEPARTMENT_OTHER): Payer: Self-pay

## 2022-07-09 ENCOUNTER — Ambulatory Visit (HOSPITAL_BASED_OUTPATIENT_CLINIC_OR_DEPARTMENT_OTHER)
Admission: RE | Admit: 2022-07-09 | Discharge: 2022-07-09 | Disposition: A | Discharge status: Home / Self Care | Payer: PPO | Source: Ambulatory Visit | Attending: Cardiology | Admitting: Cardiology

## 2022-07-09 DIAGNOSIS — I10 Essential (primary) hypertension: Secondary | ICD-10-CM | POA: Diagnosis not present

## 2022-07-09 DIAGNOSIS — I480 Paroxysmal atrial fibrillation: Secondary | ICD-10-CM | POA: Diagnosis not present

## 2022-07-09 MED ORDER — IOHEXOL 350 MG/ML SOLN
100.0000 mL | Freq: Once | INTRAVENOUS | Status: AC | PRN
  Administered 2022-07-09: 80 mL via INTRAVENOUS

## 2022-07-12 NOTE — Pre-Procedure Instructions (Addendum)
Instructed patient on the following items: Arrival time 0830 Nothing to eat or drink after midnight No meds AM of procedure Responsible person to drive you home and stay with you for 24 hrs  Have you missed any doses of anti-coagulant Xarelto- Takes once a day, hasn't missed any doses.  Don't take Monday morning.   Patient has been holding Jardiance.

## 2022-07-15 ENCOUNTER — Encounter (HOSPITAL_COMMUNITY): Payer: Self-pay | Admitting: Cardiology

## 2022-07-15 ENCOUNTER — Ambulatory Visit (HOSPITAL_COMMUNITY)
Admission: RE | Admit: 2022-07-15 | Discharge: 2022-07-15 | Disposition: A | Discharge status: Home / Self Care | Payer: PPO | Attending: Cardiology | Admitting: Cardiology

## 2022-07-15 ENCOUNTER — Other Ambulatory Visit (HOSPITAL_COMMUNITY): Payer: Self-pay

## 2022-07-15 ENCOUNTER — Ambulatory Visit (HOSPITAL_BASED_OUTPATIENT_CLINIC_OR_DEPARTMENT_OTHER): Payer: PPO | Admitting: Certified Registered Nurse Anesthetist

## 2022-07-15 ENCOUNTER — Ambulatory Visit (HOSPITAL_COMMUNITY): Payer: PPO | Admitting: Certified Registered Nurse Anesthetist

## 2022-07-15 ENCOUNTER — Encounter (HOSPITAL_COMMUNITY)
Admission: RE | Disposition: A | Discharge status: Home / Self Care | Payer: Self-pay | Source: Home / Self Care | Attending: Cardiology

## 2022-07-15 ENCOUNTER — Other Ambulatory Visit: Payer: Self-pay

## 2022-07-15 DIAGNOSIS — Z7901 Long term (current) use of anticoagulants: Secondary | ICD-10-CM | POA: Diagnosis not present

## 2022-07-15 DIAGNOSIS — I471 Supraventricular tachycardia, unspecified: Secondary | ICD-10-CM

## 2022-07-15 DIAGNOSIS — Z87891 Personal history of nicotine dependence: Secondary | ICD-10-CM | POA: Insufficient documentation

## 2022-07-15 DIAGNOSIS — I10 Essential (primary) hypertension: Secondary | ICD-10-CM

## 2022-07-15 DIAGNOSIS — E119 Type 2 diabetes mellitus without complications: Secondary | ICD-10-CM | POA: Diagnosis not present

## 2022-07-15 DIAGNOSIS — Z8249 Family history of ischemic heart disease and other diseases of the circulatory system: Secondary | ICD-10-CM | POA: Insufficient documentation

## 2022-07-15 DIAGNOSIS — I4891 Unspecified atrial fibrillation: Secondary | ICD-10-CM

## 2022-07-15 DIAGNOSIS — Z7984 Long term (current) use of oral hypoglycemic drugs: Secondary | ICD-10-CM | POA: Diagnosis not present

## 2022-07-15 DIAGNOSIS — I480 Paroxysmal atrial fibrillation: Secondary | ICD-10-CM | POA: Insufficient documentation

## 2022-07-15 HISTORY — PX: ATRIAL FIBRILLATION ABLATION: EP1191

## 2022-07-15 LAB — GLUCOSE, CAPILLARY
Glucose-Capillary: 185 mg/dL — ABNORMAL HIGH (ref 70–99)
Glucose-Capillary: 211 mg/dL — ABNORMAL HIGH (ref 70–99)

## 2022-07-15 LAB — POCT ACTIVATED CLOTTING TIME
Activated Clotting Time: 315 seconds
Activated Clotting Time: 320 seconds
Activated Clotting Time: 325 seconds

## 2022-07-15 SURGERY — ATRIAL FIBRILLATION ABLATION
Anesthesia: General

## 2022-07-15 MED ORDER — PANTOPRAZOLE SODIUM 40 MG PO TBEC
40.0000 mg | DELAYED_RELEASE_TABLET | Freq: Every day | ORAL | 0 refills | Status: DC
  Filled 2022-07-15: qty 45, 45d supply, fill #0

## 2022-07-15 MED ORDER — ACETAMINOPHEN 325 MG PO TABS: 650.0000 mg | ORAL_TABLET | ORAL | Status: DC | PRN

## 2022-07-15 MED ORDER — PROTAMINE SULFATE 10 MG/ML IV SOLN
INTRAVENOUS | Status: DC | PRN
  Administered 2022-07-15: 15 mg via INTRAVENOUS
  Administered 2022-07-15 (×2): 10 mg via INTRAVENOUS

## 2022-07-15 MED ORDER — PANTOPRAZOLE SODIUM 40 MG PO TBEC
40.0000 mg | DELAYED_RELEASE_TABLET | Freq: Every day | ORAL | Status: DC
  Administered 2022-07-15: 40 mg via ORAL
  Filled 2022-07-15: qty 1

## 2022-07-15 MED ORDER — HEPARIN SODIUM (PORCINE) 1000 UNIT/ML IJ SOLN
INTRAMUSCULAR | Status: AC
  Filled 2022-07-15: qty 10

## 2022-07-15 MED ORDER — HEPARIN SODIUM (PORCINE) 1000 UNIT/ML IJ SOLN
INTRAMUSCULAR | Status: DC | PRN
  Administered 2022-07-15: 11000 [IU] via INTRAVENOUS
  Administered 2022-07-15: 2000 [IU] via INTRAVENOUS
  Administered 2022-07-15: 3000 [IU] via INTRAVENOUS

## 2022-07-15 MED ORDER — CEFAZOLIN SODIUM-DEXTROSE 2-3 GM-%(50ML) IV SOLR
INTRAVENOUS | Status: DC | PRN
  Administered 2022-07-15: 2 g via INTRAVENOUS

## 2022-07-15 MED ORDER — LIDOCAINE 2% (20 MG/ML) 5 ML SYRINGE
INTRAMUSCULAR | Status: DC | PRN
  Administered 2022-07-15: 40 mg via INTRAVENOUS

## 2022-07-15 MED ORDER — ISOPROTERENOL HCL 0.2 MG/ML IJ SOLN
INTRAMUSCULAR | Status: AC
  Filled 2022-07-15: qty 5

## 2022-07-15 MED ORDER — MIDAZOLAM HCL 2 MG/2ML IJ SOLN
INTRAMUSCULAR | Status: DC | PRN
  Administered 2022-07-15: 2 mg via INTRAVENOUS

## 2022-07-15 MED ORDER — ONDANSETRON HCL 4 MG/2ML IJ SOLN: 4.0000 mg | Freq: Four times a day (QID) | INTRAMUSCULAR | Status: DC | PRN

## 2022-07-15 MED ORDER — ISOPROTERENOL HCL 0.2 MG/ML IJ SOLN
INTRAVENOUS | Status: DC | PRN
  Administered 2022-07-15: 2 ug/min via INTRAVENOUS

## 2022-07-15 MED ORDER — PROPOFOL 10 MG/ML IV BOLUS
INTRAVENOUS | Status: DC | PRN
  Administered 2022-07-15: 130 mg via INTRAVENOUS

## 2022-07-15 MED ORDER — HEPARIN (PORCINE) IN NACL 1000-0.9 UT/500ML-% IV SOLN
INTRAVENOUS | Status: DC | PRN
  Administered 2022-07-15 (×3): 500 mL

## 2022-07-15 MED ORDER — HEPARIN SODIUM (PORCINE) 1000 UNIT/ML IJ SOLN
INTRAMUSCULAR | Status: DC | PRN
  Administered 2022-07-15: 1000 [IU] via INTRAVENOUS

## 2022-07-15 MED ORDER — SODIUM CHLORIDE 0.9 % IV SOLN: 250.0000 mL | INTRAVENOUS | Status: DC | PRN

## 2022-07-15 MED ORDER — SODIUM CHLORIDE 0.9 % IV SOLN: INTRAVENOUS | Status: DC

## 2022-07-15 MED ORDER — SUGAMMADEX SODIUM 200 MG/2ML IV SOLN
INTRAVENOUS | Status: DC | PRN
  Administered 2022-07-15: 200 mg via INTRAVENOUS

## 2022-07-15 MED ORDER — CEFAZOLIN SODIUM-DEXTROSE 2-4 GM/100ML-% IV SOLN
INTRAVENOUS | Status: AC
  Filled 2022-07-15: qty 100

## 2022-07-15 MED ORDER — FENTANYL CITRATE (PF) 250 MCG/5ML IJ SOLN
INTRAMUSCULAR | Status: DC | PRN
  Administered 2022-07-15: 50 ug via INTRAVENOUS

## 2022-07-15 MED ORDER — ACETAMINOPHEN 500 MG PO TABS
1000.0000 mg | ORAL_TABLET | Freq: Once | ORAL | Status: AC
  Administered 2022-07-15: 1000 mg via ORAL

## 2022-07-15 MED ORDER — ROCURONIUM BROMIDE 10 MG/ML (PF) SYRINGE
PREFILLED_SYRINGE | INTRAVENOUS | Status: DC | PRN
  Administered 2022-07-15: 20 mg via INTRAVENOUS
  Administered 2022-07-15: 60 mg via INTRAVENOUS

## 2022-07-15 MED ORDER — HEPARIN (PORCINE) IN NACL 2000-0.9 UNIT/L-% IV SOLN: INTRAVENOUS | Status: DC | PRN

## 2022-07-15 MED ORDER — ONDANSETRON HCL 4 MG/2ML IJ SOLN
INTRAMUSCULAR | Status: DC | PRN
  Administered 2022-07-15: 4 mg via INTRAVENOUS

## 2022-07-15 MED ORDER — ACETAMINOPHEN 500 MG PO TABS
ORAL_TABLET | ORAL | Status: AC
  Filled 2022-07-15: qty 2

## 2022-07-15 MED ORDER — RIVAROXABAN 20 MG PO TABS
20.0000 mg | ORAL_TABLET | Freq: Every day | ORAL | Status: DC
  Administered 2022-07-15: 20 mg via ORAL
  Filled 2022-07-15: qty 1

## 2022-07-15 MED ORDER — PHENYLEPHRINE HCL-NACL 20-0.9 MG/250ML-% IV SOLN
INTRAVENOUS | Status: DC | PRN
  Administered 2022-07-15: 30 ug/min via INTRAVENOUS

## 2022-07-15 MED ORDER — DEXAMETHASONE SODIUM PHOSPHATE 10 MG/ML IJ SOLN
INTRAMUSCULAR | Status: DC | PRN
  Administered 2022-07-15: 5 mg via INTRAVENOUS

## 2022-07-15 MED ORDER — COLCHICINE 0.6 MG PO TABS
0.6000 mg | ORAL_TABLET | Freq: Two times a day (BID) | ORAL | 0 refills | Status: DC
  Filled 2022-07-15: qty 10, 5d supply, fill #0

## 2022-07-15 MED ORDER — SODIUM CHLORIDE 0.9% FLUSH: 3.0000 mL | Freq: Two times a day (BID) | INTRAVENOUS | Status: DC

## 2022-07-15 MED ORDER — SODIUM CHLORIDE 0.9% FLUSH: 3.0000 mL | INTRAVENOUS | Status: DC | PRN

## 2022-07-15 MED ORDER — COLCHICINE 0.6 MG PO TABS
0.6000 mg | ORAL_TABLET | Freq: Two times a day (BID) | ORAL | Status: DC
  Administered 2022-07-15: 0.6 mg via ORAL
  Filled 2022-07-15: qty 1

## 2022-07-15 SURGICAL SUPPLY — 21 items
BAG SNAP BAND KOVER 36X36 (MISCELLANEOUS) IMPLANT
BLANKET WARM UNDERBOD FULL ACC (MISCELLANEOUS) ×1 IMPLANT
CATH ABLAT QDOT MICRO BI TC DF (CATHETERS) IMPLANT
CATH JOSEPH QUAD ALLRED 6F REP (CATHETERS) IMPLANT
CATH OCTARAY 2.0 F 3-3-3-3-3 (CATHETERS) IMPLANT
CATH S-M CIRCA TEMP PROBE (CATHETERS) IMPLANT
CATH SOUNDSTAR ECO 8FR (CATHETERS) IMPLANT
CATH WEB BI DIR CSDF CRV REPRO (CATHETERS) IMPLANT
CLOSURE PERCLOSE PROSTYLE (VASCULAR PRODUCTS) IMPLANT
COVER SWIFTLINK CONNECTOR (BAG) ×1 IMPLANT
PACK EP LATEX FREE (CUSTOM PROCEDURE TRAY) ×1
PACK EP LF (CUSTOM PROCEDURE TRAY) ×1 IMPLANT
PAD DEFIB RADIO PHYSIO CONN (PAD) ×1 IMPLANT
PATCH CARTO3 (PAD) IMPLANT
SHEATH BAYLIS TRANSSEPTAL 98CM (NEEDLE) IMPLANT
SHEATH CARTO VIZIGO SM CVD (SHEATH) IMPLANT
SHEATH PINNACLE 7F 10CM (SHEATH) IMPLANT
SHEATH PINNACLE 8F 10CM (SHEATH) IMPLANT
SHEATH PINNACLE 9F 10CM (SHEATH) IMPLANT
SHEATH PROBE COVER 6X72 (BAG) IMPLANT
TUBING SMART ABLATE COOLFLOW (TUBING) IMPLANT

## 2022-07-15 NOTE — H&P (Signed)
Electrophysiology Office Follow up Visit Note:     Date:  07/15/2022   ID:  Benjamin [name], DOB 17-May-1950, MRN [id number]   PCP:  Mick Sell, MD            [facility] Cardiologist:  Hillis Range, MD (Inactive)  [facility] Electrophysiologist:  Lanier Prude, MD      Interval History:     Benjamin [name] is a 72 y.o. male who presents for a follow up visit. They were last seen in clinic 03/05/2021 for pAF. Previously followed by Dr Johney Frame. He takes xarelto for stroke ppx.    Today, he is feeling overall well. He reports having multiple episodes of atrial fibrillation daily.  He experiences palpitations when he has atrial fibrillation.  He is still able to accomplish what he needs to during his day.  He uses an apple watch that sends him alerts. He notices that it tends to happen after exercise. He works with a Psychologist, educational, mainly doing Emergency planning/management officer. He denies any knowledge of snoring.    He is compliant with Xarelto, 20 mg daily.   He denies any palpitations, chest pain, shortness of breath, or peripheral edema. No lightheadedness, headaches, syncope, orthopnea, or PND.  Presents for PVI today. Procedure reviewed.       Past Medical History:  Diagnosis Date   Actinic keratosis     Atrial fibrillation (HCC)      paroxysmal   Cancer (HCC)      SCC removed from neck   Cough syncope     Diabetes mellitus     Dyslipidemia     Gout     Hyperlipidemia     Hypertension     Hypopotassemia     Squamous cell carcinoma of skin 10/31/2016    right lat neck post aspect (in situ)           Past Surgical History:  Procedure Laterality Date   COLONOSCOPY WITH PROPOFOL N/A 11/07/2016    Procedure: COLONOSCOPY WITH PROPOFOL;  Surgeon: Christena Deem, MD;  Location: [facility] ENDOSCOPY;  Service: Endoscopy;  Laterality: N/A;   SHOULDER SURGERY Right 12/2020   TONSILLECTOMY AND ADENOIDECTOMY          Current Medications: Active Medications      Current Meds   Medication Sig   allopurinol (ZYLOPRIM) 300 MG tablet TAKE 1 TABLET BY MOUTH TWICE DAILY   Cholecalciferol (VITAMIN D3) 2000 UNITS TABS Take 1 tablet by mouth daily.   glucose blood (ONETOUCH VERIO) test strip CHECK BLOOD SUGAR DAILY   JARDIANCE 10 MG TABS tablet TAKE 1 TABLET BY MOUTH DAILY (Patient taking differently: Take 25 mg by mouth daily.)   losartan (COZAAR) 25 MG tablet TAKE 1 TABLET BY MOUTH DAILY   metFORMIN (GLUCOPHAGE-XR) 500 MG 24 hr tablet TAKE TWO TABLETS TWICE DAILY   metoprolol tartrate (LOPRESSOR) 50 MG tablet Take one-half tablet (25 mg) by mouth twice daily as needed for palpitations and/or fast heart rates   Multiple Vitamin (MULTIVITAMIN PO) Take by mouth.   rosuvastatin (CRESTOR) 20 MG tablet Take 1 tablet (20 mg total) by mouth daily.   sildenafil (REVATIO) 20 MG tablet TAKE 1 TABLET 3 TIMES A DAY AS NEEDED   traZODone (DESYREL) 100 MG tablet TAKE ONE TABLET BY MOUTH AT BEDTIME AS NEEDED FOR SLEEP   vitamin B-12 (CYANOCOBALAMIN) 1000 MCG tablet Take 1,000 mcg by mouth daily.   XARELTO 20 MG TABS tablet TAKE 1 TABLET BY MOUTH DAILY  WITH SUPPER        Allergies:   Patient has no known allergies.    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  He has 2 adult sons.  He exercises as stated above.  No tobacco or illicit substance use.  He does enjoy occasional beer and vodka, mixed drink on the weekends.  He tries to follow a low-cholesterol diet.            FAMILY HISTORY:  Mother is alive, she has a pacemaker secondary to heart block, she also has a stable abdominal aneurysm.  Father's health, interesting that he had an MI at age 41 and then apparently died in his sleep at age 68 that was felt to be secondary to MI.  He has 3 siblings who are alive and well.     Social Determinants of Health        Financial Resource Strain: Low Risk  (03/01/2021)    Overall Financial Resource Strain (CARDIA)     Difficulty of Paying Living Expenses: Not hard at all  Food Insecurity: No Food Insecurity (03/01/2021)    Hunger Vital Sign     Worried About Running Out of Food in the Last Year: Never true     Ran Out of Food in the Last Year: Never true  Transportation Needs: No Transportation Needs (03/01/2021)    PRAPARE - Therapist, art (Medical): No     Lack of Transportation (Non-Medical): No  Physical Activity: Sufficiently Active (03/01/2021)    Exercise Vital Sign     Days of Exercise per Week: 4 days     Minutes of Exercise per Session: 40 min  Stress: No Stress Concern Present (03/01/2021)    [employer] of Occupational Health - Occupational Stress Questionnaire     Feeling of Stress : Not at all  Social Connections: Moderately Integrated (03/01/2021)    Social Connection and Isolation Panel [NHANES]     Frequency of Communication with Friends and Family: More than three times a week     Frequency of Social Gatherings with Friends and Family: Once a week     Attends Religious Services: More than 4 times per year     Active Member of [employer] or Organizations: No     Attends Tax inspector Meetings: Never  Marital Status: Married      Family History: The patient's family history includes Aneurysm in his mother; Atrial fibrillation in his mother; CAD in his father; Congestive Heart Failure in his mother; Diabetes in his father; Gout in his father and son; Hearing loss in his son; Heart attack in his father; Hypertension in his father; Other in his son and son; Ovarian cancer in his mother; Ulcers in his father.   ROS:   Please see the history of present illness.   All other systems reviewed and are negative.   EKGs/Labs/Other Studies Reviewed:     The following studies were reviewed today:     Recent Labs: 04/25/2021: ALT 30; BUN 26; Creatinine, Ser 1.01; Hemoglobin 16.1; Platelets 276; Potassium 4.3; Sodium 139; TSH 2.170  Recent Lipid Panel Labs (Brief)          Component Value Date/Time    CHOL 155 04/25/2021 0929    TRIG 372 (H) 04/25/2021 0929    HDL 44 04/25/2021 0929    CHOLHDL 3.5 04/25/2021 0929    CHOLHDL 6.0 01/06/2009 0630    VLDL 56 (H) 01/06/2009 0630    LDLCALC 55 04/25/2021 0929        Physical Exam:     VS:  BP 134/65   Pulse 72   Ht 5\' 9"  (1.753 m)   Wt 168 lb (76.2 kg)   SpO2 97%   BMI 24.81 kg/m         Wt Readings from Last 3 Encounters:  03/22/22 168 lb (76.2 kg)  08/27/21 169 lb (76.7 kg)  04/25/21 166 lb (75.3 kg)      GEN:  Well nourished, well developed in no acute distress CARDIAC: RRR, no murmurs, rubs, gallops           Assessment ASSESSMENT:     1. Paroxysmal atrial fibrillation (HCC)   2. Primary hypertension     PLAN:     In order of problems listed above:   #pAF Maintaining sinus rhythm. Continue xarelto.   Discussed treatment options today for their AF including antiarrhythmic drug therapy and ablation. Discussed risks, recovery and likelihood of success. Discussed potential need for repeat ablation procedures and antiarrhythmic drugs after an initial ablation. They wish to proceed  with scheduling.   Risk, benefits, and alternatives to EP study and radiofrequency ablation for afib were also discussed in detail today. These risks include but are not limited to stroke, bleeding, vascular damage, tamponade, perforation, damage to the esophagus, lungs, and other structures, pulmonary vein stenosis, worsening renal function, and death. The patient understands these risk and wishes to proceed.  We will therefore proceed with catheter ablation at the next available time.  Carto, ICE, anesthesia are requested for the procedure.  Will also obtain CT PV protocol prior to the procedure to exclude LAA thrombus and further evaluate atrial anatomy.   #Hypertension At goal today.  Recommend checking blood pressures 1-2 times per week at home and recording the values.  Recommend bringing these recordings to the primary care physician.      Presents for PVI today. Procedure reviewed.     Medication Adjustments/Labs and Tests Ordered: Current medicines are reviewed at length with the patient today.  Concerns regarding medicines are outlined above.  No orders of the defined types were placed in this encounter.   No orders of the defined types were placed in this encounter.   I,Rachel Rivera,acting as a scribe for Lanier Prude, MD.,have documented all relevant  documentation on the behalf of Lanier Prude, MD,as directed by  Lanier Prude, MD while in the presence of Lanier Prude, MD.   Signed, Steffanie Dunn, MD, [facility], [facility] 07/15/2022 Electrophysiology [facility]

## 2022-07-15 NOTE — Discharge Instructions (Signed)
Cardiac Ablation, Care After  This sheet gives you information about how to care for yourself after your procedure. Your health care provider may also give you more specific instructions. If you have problems or questions, contact your health care provider. What can I expect after the procedure? After the procedure, it is common to have: Bruising around your puncture site. Tenderness around your puncture site. Skipped heartbeats. If you had an atrial fibrillation ablation, you may have atrial fibrillation during the first several months after your procedure.  Tiredness (fatigue).  Follow these instructions at home: Puncture site care  Follow instructions from your health care provider about how to take care of your puncture site. Make sure you: If present, leave stitches (sutures), skin glue, or adhesive strips in place. These skin closures may need to stay in place for up to 2 weeks. If adhesive strip edges start to loosen and curl up, you may trim the loose edges. Do not remove adhesive strips completely unless your health care provider tells you to do that. If a large square bandage is present, this may be removed 24 hours after surgery.  Check your puncture site every day for signs of infection. Check for: Redness, swelling, or pain. Fluid or blood. If your puncture site starts to bleed, lie down on your back, apply firm pressure to the area, and contact your health care provider. Warmth. Pus or a bad smell. A pea or small marble sized lump at the site is normal and can take up to three months to resolve.  Driving Do not drive for at least 4 days after your procedure or however long your health care provider recommends. (Do not resume driving if you have previously been instructed not to drive for other health reasons.) Do not drive or use heavy machinery while taking prescription pain medicine. Activity Avoid activities that take a lot of effort for at least 7 days after your  procedure. Do not lift anything that is heavier than 5 lb (4.5 kg) for one week.  No sexual activity for 1 week.  Return to your normal activities as told by your health care provider. Ask your health care provider what activities are safe for you. General instructions Take over-the-counter and prescription medicines only as told by your health care provider. Do not use any products that contain nicotine or tobacco, such as cigarettes and e-cigarettes. If you need help quitting, ask your health care provider. You may shower after 24 hours, but Do not take baths, swim, or use a hot tub for 1 week.  Do not drink alcohol for 24 hours after your procedure. Keep all follow-up visits as told by your health care provider. This is important. Contact a health care provider if: You have redness, mild swelling, or pain around your puncture site. You have fluid or blood coming from your puncture site that stops after applying firm pressure to the area. Your puncture site feels warm to the touch. You have pus or a bad smell coming from your puncture site. You have a fever. You have chest pain or discomfort that spreads to your neck, jaw, or arm. You have chest pain that is worse with lying on your back or taking a deep breath. You are sweating a lot. You feel nauseous. You have a fast or irregular heartbeat. You have shortness of breath. You are dizzy or light-headed and feel the need to lie down. You have pain or numbness in the arm or leg closest to your puncture   site. Get help right away if: Your puncture site suddenly swells. Your puncture site is bleeding and the bleeding does not stop after applying firm pressure to the area. These symptoms may represent a serious problem that is an emergency. Do not wait to see if the symptoms will go away. Get medical help right away. Call your local emergency services (911 in the [place] Do not drive yourself to the hospital. Summary After the procedure, it  is normal to have bruising and tenderness at the puncture site in your groin, neck, or forearm. Check your puncture site every day for signs of infection. Get help right away if your puncture site is bleeding and the bleeding does not stop after applying firm pressure to the area. This is a medical emergency. This information is not intended to replace advice given to you by your health care provider. Make sure you discuss any questions you have with your health care provider.       Femoral Site Care This sheet gives you information about how to care for yourself after your procedure. Your health care provider may also give you more specific instructions. If you have problems or questions, contact your health care provider. What can I expect after the procedure?  After the procedure, it is common to have: Bruising that usually fades within 1-2 weeks. Tenderness at the site. Follow these instructions at home: Wound care Follow instructions from your health care provider about how to take care of your insertion site. Make sure you: Wash your hands with soap and water before you change your bandage (dressing). If soap and water are not available, use hand sanitizer. Remove your dressing as told by your health care provider. In 24 hours Do not take baths, swim, or use a hot tub until your health care provider approves. You may shower 24-48 hours after the procedure or as told by your health care provider. Gently wash the site with plain soap and water. Pat the area dry with a clean towel. Do not rub the site. This may cause bleeding. Do not apply powder or lotion to the site. Keep the site clean and dry. Check your femoral site every day for signs of infection. Check for: Redness, swelling, or pain. Fluid or blood. Warmth. Pus or a bad smell. Activity For the first 2-3 days after your procedure, or as long as directed: Avoid climbing stairs as much as possible. Do not squat. Do not  lift anything that is heavier than 10 lb (4.5 kg), or the limit that you are told, until your health care provider says that it is safe. For 5 days Rest as directed. Avoid sitting for a long time without moving. Get up to take short walks every 1-2 hours. Do not drive for 24 hours if you were given a medicine to help you relax (sedative). General instructions Take over-the-counter and prescription medicines only as told by your health care provider. Keep all follow-up visits as told by your health care provider. This is important. Contact a health care provider if you have: A fever or chills. You have redness, swelling, or pain around your insertion site. Get help right away if: The catheter insertion area swells very fast. You pass out. You suddenly start to sweat or your skin gets clammy. The catheter insertion area is bleeding, and the bleeding does not stop when you hold steady pressure on the area. The area near or just beyond the catheter insertion site becomes pale, cool, tingly, or numb.   These symptoms may represent a serious problem that is an emergency. Do not wait to see if the symptoms will go away. Get medical help right away. Call your local emergency services (911 in the [place] Do not drive yourself to the hospital. Summary After the procedure, it is common to have bruising that usually fades within 1-2 weeks. Check your femoral site every day for signs of infection. Do not lift anything that is heavier than 10 lb (4.5 kg), or the limit that you are told, until your health care provider says that it is safe. This information is not intended to replace advice given to you by your health care provider. Make sure you discuss any questions you have with your health care provider. Document Revised: 02/10/2017 Document Reviewed: 02/10/2017 Elsevier Patient Education  2020 [employer] 

## 2022-07-15 NOTE — Anesthesia Postprocedure Evaluation (Signed)
Anesthesia Post Note  Patient: Benjamin [name]  Procedure(s) Performed: ATRIAL FIBRILLATION ABLATION     Patient location during evaluation: PACU Anesthesia Type: General Level of consciousness: awake and alert Pain management: pain level controlled Vital Signs Assessment: post-procedure vital signs reviewed and stable Respiratory status: spontaneous breathing, nonlabored ventilation and respiratory function stable Cardiovascular status: blood pressure returned to baseline and stable Postop Assessment: no apparent nausea or vomiting Anesthetic complications: no  There were no known notable events for this encounter.  Last Vitals:  Vitals:   07/15/22 1335 07/15/22 1340  BP: 113/66   Pulse: 65   Resp: 10   Temp:  36.8 C  SpO2: 95%     Last Pain:  Vitals:   07/15/22 1340  TempSrc: Temporal  PainSc: 0-No pain                 Earline Stiner,W. EDMOND

## 2022-07-15 NOTE — Transfer of Care (Signed)
Immediate Anesthesia Transfer of Care Note  Patient: Benjamin [name]  Procedure(s) Performed: ATRIAL FIBRILLATION ABLATION  Patient Location: Cath Lab  Anesthesia Type:General  Level of Consciousness: awake, alert , and oriented  Airway & Oxygen Therapy: Patient Spontanous Breathing  Post-op Assessment: Report given to RN and Post -op Vital signs reviewed and stable  Post vital signs: Reviewed and stable  Last Vitals:  Vitals Value Taken Time  BP 127/59 07/15/22 1310  Temp 36.8 C 07/15/22 1310  Pulse 75 07/15/22 1314  Resp 17 07/15/22 1314  SpO2 93 % 07/15/22 1314  Vitals shown include unvalidated device data.  Last Pain:  Vitals:   07/15/22 1310  TempSrc: Temporal         Complications: There were no known notable events for this encounter.

## 2022-07-15 NOTE — Anesthesia Preprocedure Evaluation (Addendum)
Anesthesia Evaluation  Patient identified by MRN, date of birth, ID band Patient awake    Reviewed: Allergy & Precautions, H&P , NPO status , Patient's Chart, lab work & pertinent test results  Airway Mallampati: II  TM Distance: >3 FB Neck ROM: Full    Dental no notable dental hx. (+) Teeth Intact, Dental Advisory Given   Pulmonary former smoker   Pulmonary exam normal breath sounds clear to auscultation       Cardiovascular hypertension, Pt. on medications and Pt. on home beta blockers + dysrhythmias Atrial Fibrillation  Rhythm:Irregular Rate:Normal     Neuro/Psych negative neurological ROS  negative psych ROS   GI/Hepatic negative GI ROS, Neg liver ROS,,,  Endo/Other  diabetes, Type 2, Oral Hypoglycemic Agents    Renal/GU negative Renal ROS  negative genitourinary   Musculoskeletal  (+) Arthritis , Osteoarthritis,    Abdominal   Peds  Hematology negative hematology ROS (+)   Anesthesia Other Findings   Reproductive/Obstetrics negative OB ROS                             Anesthesia Physical Anesthesia Plan  ASA: 3  Anesthesia Plan: General   Post-op Pain Management: Tylenol PO (pre-op)*   Induction: Intravenous  PONV Risk Score and Plan: 3 and Ondansetron, Dexamethasone and Treatment may vary due to age or medical condition  Airway Management Planned: Oral ETT  Additional Equipment:   Intra-op Plan:   Post-operative Plan: Extubation in OR  Informed Consent: I have reviewed the patients History and Physical, chart, labs and discussed the procedure including the risks, benefits and alternatives for the proposed anesthesia with the patient or authorized representative who has indicated his/her understanding and acceptance.     Dental advisory given  Plan Discussed with: CRNA  Anesthesia Plan Comments:        Anesthesia Quick Evaluation

## 2022-07-15 NOTE — Anesthesia Procedure Notes (Signed)
Procedure Name: Intubation Date/Time: 07/15/2022 10:06 AM  Performed by: Garfield Cornea, CRNAPre-anesthesia Checklist: Patient identified, Emergency Drugs available, Suction available and Patient being monitored Patient Re-evaluated:Patient Re-evaluated prior to induction Oxygen Delivery Method: Circle System Utilized Preoxygenation: Pre-oxygenation with 100% oxygen Induction Type: IV induction Ventilation: Mask ventilation without difficulty Laryngoscope Size: Mac and 4 Grade View: Grade I Tube type: Oral Tube size: 7.5 mm Number of attempts: 1 Airway Equipment and Method: Stylet Placement Confirmation: ETT inserted through vocal cords under direct vision, positive ETCO2 and breath sounds checked- equal and bilateral Secured at: 22 cm Tube secured with: Tape Dental Injury: Teeth and Oropharynx as per pre-operative assessment

## 2022-07-16 ENCOUNTER — Encounter (HOSPITAL_COMMUNITY): Payer: Self-pay | Admitting: Cardiology

## 2022-07-31 DIAGNOSIS — M9905 Segmental and somatic dysfunction of pelvic region: Secondary | ICD-10-CM | POA: Diagnosis not present

## 2022-07-31 DIAGNOSIS — M5431 Sciatica, right side: Secondary | ICD-10-CM | POA: Diagnosis not present

## 2022-07-31 DIAGNOSIS — M9903 Segmental and somatic dysfunction of lumbar region: Secondary | ICD-10-CM | POA: Diagnosis not present

## 2022-07-31 DIAGNOSIS — M5416 Radiculopathy, lumbar region: Secondary | ICD-10-CM | POA: Diagnosis not present

## 2022-08-05 ENCOUNTER — Other Ambulatory Visit (HOSPITAL_COMMUNITY): Payer: Self-pay

## 2022-08-12 ENCOUNTER — Ambulatory Visit (HOSPITAL_COMMUNITY)
Admission: RE | Admit: 2022-08-12 | Discharge: 2022-08-12 | Disposition: A | Discharge status: Home / Self Care | Payer: PPO | Source: Ambulatory Visit | Attending: Physician Assistant | Admitting: Physician Assistant

## 2022-08-12 VITALS — BP 116/60 | HR 66 | Ht 69.0 in | Wt 161.8 lb

## 2022-08-12 DIAGNOSIS — Z7901 Long term (current) use of anticoagulants: Secondary | ICD-10-CM | POA: Diagnosis not present

## 2022-08-12 DIAGNOSIS — D6869 Other thrombophilia: Secondary | ICD-10-CM | POA: Diagnosis not present

## 2022-08-12 DIAGNOSIS — I480 Paroxysmal atrial fibrillation: Secondary | ICD-10-CM | POA: Diagnosis not present

## 2022-08-12 DIAGNOSIS — I110 Hypertensive heart disease with heart failure: Secondary | ICD-10-CM | POA: Insufficient documentation

## 2022-08-12 DIAGNOSIS — E119 Type 2 diabetes mellitus without complications: Secondary | ICD-10-CM | POA: Insufficient documentation

## 2022-08-12 DIAGNOSIS — E785 Hyperlipidemia, unspecified: Secondary | ICD-10-CM | POA: Diagnosis not present

## 2022-08-12 DIAGNOSIS — Z79899 Other long term (current) drug therapy: Secondary | ICD-10-CM | POA: Insufficient documentation

## 2022-08-12 DIAGNOSIS — I2510 Atherosclerotic heart disease of native coronary artery without angina pectoris: Secondary | ICD-10-CM | POA: Diagnosis not present

## 2022-08-12 NOTE — Progress Notes (Signed)
Primary Care Physician: Mick Sell, MD Primary Cardiologist: Hillis Range, MD (Inactive) Electrophysiologist: Lanier Prude, MD  Referring Physician: Dr Tiana Loft is a 72 y.o. male with a history of DM, HLD, HTN, CAD, SVT, atrial fibrillation who presents for follow up in the [facility]. Patient is on Xarelto for a CHADS2VASC score of 4. He was seen by Dr Lalla Brothers 03/22/22 and was having more frequent episodes of afib. He underwent afib and SVT ablation on 07/15/22.  On follow up today, patient reports that he has done very well since the ablation. He has not had any symptoms of tachypalpitations. His smart watch has only shown SR. He denies chest pain, swallowing pain, or groin issues.   Today, he denies symptoms of palpitations, chest pain, shortness of breath, orthopnea, PND, lower extremity edema, dizziness, presyncope, syncope, snoring, daytime somnolence, bleeding, or neurologic sequela. The patient is tolerating medications without difficulties and is otherwise without complaint today.    Atrial Fibrillation Risk Factors:  he does not have symptoms or diagnosis of sleep apnea. he does not have a history of rheumatic fever.   Atrial Fibrillation Management history:  Previous antiarrhythmic drugs: none Previous cardioversions: none Previous ablations: 07/15/22 Anticoagulation history: Xarelto   ROS- All systems are reviewed and negative except as per the HPI above.   Physical Exam: Ht 5\' 9"  (1.753 m)   Wt 73.4 kg   BMI 23.89 kg/m   GEN: Well nourished, well developed in no acute distress NECK: No JVD; No carotid bruits CARDIAC: Regular rate and rhythm, no murmurs, rubs, gallops RESPIRATORY:  Clear to auscultation without rales, wheezing or rhonchi  ABDOMEN: Soft, non-tender, non-distended EXTREMITIES:  No edema; No deformity   Wt Readings from Last 3 Encounters:  08/12/22 73.4 kg  07/15/22 72.6 kg  03/22/22 76.2  kg     EKG today demonstrates  SR Vent. rate 66 BPM PR interval 200 ms QRS duration 82 ms QT/QTcB 402/421 ms  Echo 03/06/16 demonstrated  - Left ventricle: The cavity size was normal. Wall thickness was    normal. Systolic function was normal. The estimated ejection    fraction was in the range of 60% to 65%. Wall motion was normal;    there were no regional wall motion abnormalities. Doppler    parameters are consistent with abnormal left ventricular    relaxation (grade 1 diastolic dysfunction).  - Aortic valve: There was no stenosis.  - Aorta: Ascending aortic diameter: 38 mm (S).  - Ascending aorta: The ascending aorta was mildly dilated.  - Mitral valve: There was trivial regurgitation.  - Right ventricle: The cavity size was normal. Systolic function    was normal.  - Tricuspid valve: Peak RV-RA gradient (S): 18 mm Hg.  - Pulmonary arteries: PA peak pressure: 23 mm Hg (S).  - Inferior vena cava: The vessel was normal in size. The    respirophasic diameter changes were in the normal range (>= 50%),    consistent with normal central venous pressure.   Impressions:   - Normal LV size with EF 60-65%. Normal RV size and systolic    function. No significant valvular abnormalities.    CHA2DS2-VASc Score = 4  The patient's score is based upon: CHF History: 0 HTN History: 1 Diabetes History: 1 Stroke History: 0 Vascular Disease History: 1 Age Score: 1 Gender Score: 0       ASSESSMENT AND PLAN: Paroxysmal Atrial Fibrillation (ICD10:  I48.0)  The patient's CHA2DS2-VASc score is 4, indicating a 4.8% annual risk of stroke.   S/p afib ablation 07/15/22 Patient appears to be maintaining SR. Continue Xarelto 20 mg daily with no missed doses for 3 months post ablation Continue Lopressor 25 mg (1/2 tab) BID PRN for heart racing Apple Watch for home monitoring   Secondary Hypercoagulable State (ICD10:  D68.69) The patient is at significant risk for stroke/thromboembolism based  upon his CHA2DS2-VASc Score of 4.  Continue Rivaroxaban (Xarelto).   CAD CAC score 1299 On statin No anginal symptoms  SVT AVNRT s/p ablation 07/15/22  HTN Stable on current regimen   Follow up with Dr Lalla Brothers as scheduled.    Jorja Loa PA-C [facility] [address] [place], [place] [address] [phone]

## 2022-08-20 DIAGNOSIS — M7061 Trochanteric bursitis, right hip: Secondary | ICD-10-CM | POA: Diagnosis not present

## 2022-08-20 DIAGNOSIS — M25551 Pain in right hip: Secondary | ICD-10-CM | POA: Diagnosis not present

## 2022-08-20 DIAGNOSIS — M1611 Unilateral primary osteoarthritis, right hip: Secondary | ICD-10-CM | POA: Diagnosis not present

## 2022-08-28 DIAGNOSIS — M5431 Sciatica, right side: Secondary | ICD-10-CM | POA: Diagnosis not present

## 2022-08-28 DIAGNOSIS — M5416 Radiculopathy, lumbar region: Secondary | ICD-10-CM | POA: Diagnosis not present

## 2022-08-28 DIAGNOSIS — M9905 Segmental and somatic dysfunction of pelvic region: Secondary | ICD-10-CM | POA: Diagnosis not present

## 2022-08-28 DIAGNOSIS — M9903 Segmental and somatic dysfunction of lumbar region: Secondary | ICD-10-CM | POA: Diagnosis not present

## 2022-09-02 DIAGNOSIS — M25551 Pain in right hip: Secondary | ICD-10-CM | POA: Diagnosis not present

## 2022-09-04 DIAGNOSIS — M180 Bilateral primary osteoarthritis of first carpometacarpal joints: Secondary | ICD-10-CM | POA: Diagnosis not present

## 2022-09-09 DIAGNOSIS — M1611 Unilateral primary osteoarthritis, right hip: Secondary | ICD-10-CM | POA: Diagnosis not present

## 2022-09-11 DIAGNOSIS — M25551 Pain in right hip: Secondary | ICD-10-CM | POA: Diagnosis not present

## 2022-09-24 DIAGNOSIS — M1611 Unilateral primary osteoarthritis, right hip: Secondary | ICD-10-CM | POA: Diagnosis not present

## 2022-10-02 DIAGNOSIS — E119 Type 2 diabetes mellitus without complications: Secondary | ICD-10-CM | POA: Diagnosis not present

## 2022-10-09 DIAGNOSIS — M5431 Sciatica, right side: Secondary | ICD-10-CM | POA: Diagnosis not present

## 2022-10-09 DIAGNOSIS — M5416 Radiculopathy, lumbar region: Secondary | ICD-10-CM | POA: Diagnosis not present

## 2022-10-09 DIAGNOSIS — I10 Essential (primary) hypertension: Secondary | ICD-10-CM | POA: Diagnosis not present

## 2022-10-09 DIAGNOSIS — I4891 Unspecified atrial fibrillation: Secondary | ICD-10-CM | POA: Diagnosis not present

## 2022-10-09 DIAGNOSIS — E7849 Other hyperlipidemia: Secondary | ICD-10-CM | POA: Diagnosis not present

## 2022-10-09 DIAGNOSIS — M109 Gout, unspecified: Secondary | ICD-10-CM | POA: Diagnosis not present

## 2022-10-09 DIAGNOSIS — E119 Type 2 diabetes mellitus without complications: Secondary | ICD-10-CM | POA: Diagnosis not present

## 2022-10-09 DIAGNOSIS — M9905 Segmental and somatic dysfunction of pelvic region: Secondary | ICD-10-CM | POA: Diagnosis not present

## 2022-10-09 DIAGNOSIS — M9903 Segmental and somatic dysfunction of lumbar region: Secondary | ICD-10-CM | POA: Diagnosis not present

## 2022-10-23 ENCOUNTER — Ambulatory Visit: Payer: PPO | Admitting: Dermatology

## 2022-10-23 ENCOUNTER — Encounter: Payer: Self-pay | Admitting: Dermatology

## 2022-10-23 VITALS — BP 127/60 | HR 60

## 2022-10-23 DIAGNOSIS — W908XXA Exposure to other nonionizing radiation, initial encounter: Secondary | ICD-10-CM | POA: Diagnosis not present

## 2022-10-23 DIAGNOSIS — L219 Seborrheic dermatitis, unspecified: Secondary | ICD-10-CM | POA: Diagnosis not present

## 2022-10-23 DIAGNOSIS — Z7189 Other specified counseling: Secondary | ICD-10-CM

## 2022-10-23 DIAGNOSIS — Z1283 Encounter for screening for malignant neoplasm of skin: Secondary | ICD-10-CM | POA: Diagnosis not present

## 2022-10-23 DIAGNOSIS — Z872 Personal history of diseases of the skin and subcutaneous tissue: Secondary | ICD-10-CM | POA: Diagnosis not present

## 2022-10-23 DIAGNOSIS — Z79899 Other long term (current) drug therapy: Secondary | ICD-10-CM

## 2022-10-23 DIAGNOSIS — L814 Other melanin hyperpigmentation: Secondary | ICD-10-CM | POA: Diagnosis not present

## 2022-10-23 DIAGNOSIS — L820 Inflamed seborrheic keratosis: Secondary | ICD-10-CM

## 2022-10-23 DIAGNOSIS — L578 Other skin changes due to chronic exposure to nonionizing radiation: Secondary | ICD-10-CM

## 2022-10-23 DIAGNOSIS — D692 Other nonthrombocytopenic purpura: Secondary | ICD-10-CM

## 2022-10-23 DIAGNOSIS — L570 Actinic keratosis: Secondary | ICD-10-CM

## 2022-10-23 DIAGNOSIS — D1801 Hemangioma of skin and subcutaneous tissue: Secondary | ICD-10-CM

## 2022-10-23 DIAGNOSIS — L821 Other seborrheic keratosis: Secondary | ICD-10-CM | POA: Diagnosis not present

## 2022-10-23 DIAGNOSIS — D229 Melanocytic nevi, unspecified: Secondary | ICD-10-CM

## 2022-10-23 DIAGNOSIS — Z8589 Personal history of malignant neoplasm of other organs and systems: Secondary | ICD-10-CM

## 2022-10-23 DIAGNOSIS — Z86007 Personal history of in-situ neoplasm of skin: Secondary | ICD-10-CM

## 2022-10-23 MED ORDER — KETOCONAZOLE 2 % EX CREA: TOPICAL_CREAM | CUTANEOUS | 11 refills | Status: DC

## 2022-10-23 NOTE — Progress Notes (Signed)
Follow-Up Visit   Subjective  Benjamin [name] is a 72 y.o. male who presents for the following: Skin Cancer Screening and Full Body Skin Exam Hx of sccis, hx of aks at face and ears, hx of isks   Left forearm  Left ear   The patient presents for Total-Body Skin Exam (TBSE) for skin cancer screening and mole check. The patient has spots, moles and lesions to be evaluated, some may be new or changing and the patient may have concern these could be cancer.    The following portions of the chart were reviewed this encounter and updated as appropriate: medications, allergies, medical history  Review of Systems:  No other skin or systemic complaints except as noted in HPI or Assessment and Plan.  Objective  Well appearing patient in no apparent distress; mood and affect are within normal limits.  A full examination was performed including scalp, head, eyes, ears, nose, lips, neck, chest, axillae, abdomen, back, buttocks, bilateral upper extremities, bilateral lower extremities, hands, feet, fingers, toes, fingernails, and toenails. All findings within normal limits unless otherwise noted below.   Relevant physical exam findings are noted in the Assessment and Plan.  left arm x 1 / right lower leg x 1 / right chest x 1 (3) Erythematous stuck-on, waxy papule or plaque  left temple x 1, left ear, left forehead x 3 (5) Erythematous thin papules/macules with gritty scale.     Assessment & Plan   SKIN CANCER SCREENING PERFORMED TODAY.  ACTINIC DAMAGE - Chronic condition, secondary to cumulative UV/sun exposure - diffuse scaly erythematous macules with underlying dyspigmentation - Recommend daily broad spectrum sunscreen SPF 30+ to sun-exposed areas, reapply every 2 hours as needed.  - Staying in the shade or wearing long sleeves, sun glasses (UVA+UVB protection) and wide brim hats (4-inch brim around the entire circumference of the hat) are also recommended for sun protection.  - Call  for new or changing lesions.  LENTIGINES, SEBORRHEIC KERATOSES, HEMANGIOMAS - Benign normal skin lesions - Benign-appearing - Call for any changes  MELANOCYTIC NEVI - Tan-brown and/or pink-flesh-colored symmetric macules and papules - Benign appearing on exam today - Observation - Call clinic for new or changing moles - Recommend daily use of broad spectrum spf 30+ sunscreen to sun-exposed areas.   Purpura - Chronic; persistent and recurrent.  Treatable, but not curable. - Violaceous macules and patches - Benign - Related to trauma, age, sun damage and/or use of blood thinners, chronic use of topical and/or oral steroids - Observe - Can use OTC arnica containing moisturizer such as Dermend Bruise Formula if desired - Call for worsening or other concerns  SEBORRHEIC DERMATITIS Exam: Pink patches with greasy scale at face Chronic and persistent condition with duration or expected duration over one year. Condition is symptomatic / bothersome to patient. Not to goal.  Seborrheic Dermatitis is a chronic persistent rash characterized by pinkness and scaling most commonly of the mid face but also can occur on the scalp (dandruff), ears; mid chest, mid back and groin.  It tends to be exacerbated by stress and cooler weather.  People who have neurologic disease may experience new onset or exacerbation of existing seborrheic dermatitis.  The condition is not curable but treatable and can be controlled.  Treatment Plan: Ketoconazole cream  mwf qhs at bedtime Will add hct 2.5 if needed in future  HISTORY OF SQUAMOUS CELL CARCINOMA IN SITU OF THE SKIN Right lateral neck posterior aspect 2018 - No evidence of  recurrence today - Recommend regular full body skin exams - Recommend daily broad spectrum sunscreen SPF 30+ to sun-exposed areas, reapply every 2 hours as needed.  - Call if any new or changing lesions are noted between office visits  Seborrheic dermatitis  Related  Medications ketoconazole (NIZORAL) 2 % cream Apply topically to face nightly on m-w-f for seb derm  Inflamed seborrheic keratosis (3) left arm x 1 / right lower leg x 1 / right chest x 1  Symptomatic, irritating, patient would like treated.  Destruction of lesion - left arm x 1 / right lower leg x 1 / right chest x 1 (3) Complexity: simple   Destruction method: cryotherapy   Informed consent: discussed and consent obtained   Timeout:  patient name, date of birth, surgical site, and procedure verified Lesion destroyed using liquid nitrogen: Yes   Region frozen until ice ball extended beyond lesion: Yes   Outcome: patient tolerated procedure well with no complications   Post-procedure details: wound care instructions given    Actinic keratosis (5) left temple x 1, left ear, left forehead x 3  Actinic keratoses are precancerous spots that appear secondary to cumulative UV radiation exposure/sun exposure over time. They are chronic with expected duration over 1 year. A portion of actinic keratoses will progress to squamous cell carcinoma of the skin. It is not possible to reliably predict which spots will progress to skin cancer and so treatment is recommended to prevent development of skin cancer.  Recommend daily broad spectrum sunscreen SPF 30+ to sun-exposed areas, reapply every 2 hours as needed.  Recommend staying in the shade or wearing long sleeves, sun glasses (UVA+UVB protection) and wide brim hats (4-inch brim around the entire circumference of the hat). Call for new or changing lesions.  Destruction of lesion - left temple x 1, left ear, left forehead x 3 (5) Complexity: simple   Destruction method: cryotherapy   Informed consent: discussed and consent obtained   Timeout:  patient name, date of birth, surgical site, and procedure verified Lesion destroyed using liquid nitrogen: Yes   Region frozen until ice ball extended beyond lesion: Yes   Outcome: patient tolerated  procedure well with no complications   Post-procedure details: wound care instructions given    Actinic skin damage  Skin cancer screening  Medication management  Counseling and coordination of care  Lentigo  Melanocytic nevus, unspecified location  History of squamous cell carcinoma   Return in about 1 year (around 10/23/2023) for TBSE.  IAsher Muir, CMA, am acting as scribe for Armida Sans, MD.   Documentation: I have reviewed the above documentation for accuracy and completeness, and I agree with the above.  Armida Sans, MD

## 2022-10-23 NOTE — Patient Instructions (Addendum)

## 2022-10-25 ENCOUNTER — Encounter: Payer: Self-pay | Admitting: Dermatology

## 2022-11-06 DIAGNOSIS — M5416 Radiculopathy, lumbar region: Secondary | ICD-10-CM | POA: Diagnosis not present

## 2022-11-06 DIAGNOSIS — M9905 Segmental and somatic dysfunction of pelvic region: Secondary | ICD-10-CM | POA: Diagnosis not present

## 2022-11-06 DIAGNOSIS — M5431 Sciatica, right side: Secondary | ICD-10-CM | POA: Diagnosis not present

## 2022-11-06 DIAGNOSIS — M9903 Segmental and somatic dysfunction of lumbar region: Secondary | ICD-10-CM | POA: Diagnosis not present

## 2022-11-07 ENCOUNTER — Encounter: Payer: Self-pay | Admitting: Dermatology

## 2022-11-07 MED ORDER — HYDROCORTISONE 2.5 % EX CREA: TOPICAL_CREAM | CUTANEOUS | 3 refills | Status: DC

## 2022-11-13 ENCOUNTER — Ambulatory Visit: Payer: PPO | Admitting: Cardiology

## 2022-11-24 NOTE — H&P (View-Only) (Signed)
Electrophysiology Office Follow up Visit Note:    Date:  11/25/2022   ID:  Benjamin [name], DOB 1950/07/10, MRN [id number]  PCP:  Mick Sell, MD  [facility] Cardiologist:  Hillis Range, MD (Inactive)  [facility] Electrophysiologist:  Lanier Prude, MD    Interval History:    Benjamin [name] is a 72 y.o. male who presents for a follow up visit.   He had an A-fib ablation on July 15, 2022 during which the veins, posterior wall were ablated.  He also had ablation of SVT during which the slow pathway was ablated.  He saw [place] on August 12, 2022.  At that appointment he was doing well without recurrence of arrhythmia.  He takes Xarelto for stroke prophylaxis.  Today he tells me he has been doing well.  He is not noticed a recurrence of atrial fibrillation.  He uses an Scientist, physiological for arrhythmia monitoring.  Interestingly, today I had him record tracing while in typical appearing atrial flutter in the Apple watch algorithm reported sinus rhythm.     Past medical, surgical, social and family history were reviewed.  ROS:   Please see the history of present illness.    All other systems reviewed and are negative.  EKGs/Labs/Other Studies Reviewed:    The following studies were reviewed today:    EKG Interpretation Date/Time:  Monday November 25 2022 11:31:01 EDT Ventricular Rate:  71 PR Interval:    QRS Duration:  80 QT Interval:  412 QTC Calculation: 447 R Axis:   69  Text Interpretation: Atrial flutter with variable A-V block Confirmed by Steffanie Dunn [phone]) on 11/25/2022 11:49:52 AM    Physical Exam:    VS:  BP 114/60   Pulse 71   Ht 5\' 9"  (1.753 m)   Wt 167 lb (75.8 kg)   SpO2 97%   BMI 24.66 kg/m     Wt Readings from Last 3 Encounters:  11/25/22 167 lb (75.8 kg)  08/12/22 161 lb 12.8 oz (73.4 kg)  07/15/22 160 lb (72.6 kg)     GEN:  Well nourished, well developed in no acute distress CARDIAC: RRR, no murmurs, rubs,  gallops RESPIRATORY:  Clear to auscultation without rales, wheezing or rhonchi       ASSESSMENT:    1. Paroxysmal atrial fibrillation (HCC)   2. SVT (supraventricular tachycardia) (HCC)   3. Primary hypertension    PLAN:    In order of problems listed above:  #Atrial flutter New diagnosis after previously successful atrial fibrillation and SVT ablation. Continue Xarelto Plan for cardioversion to see if this is an isolated incident or if he will go back into atrial flutter.  I discussed the cardioversion procedure in detail including the risks and he wished to proceed.  I will plan to see him back in 4 to 6 weeks.  If he has recurrence after a successful cardioversion, plan for catheter ablation.  #atrial fibrillation #SVT Doing well after his catheter ablation in June 2024.  He has gone into atrial flutter now. Continue Xarelto Plan for cardioversion.  I discussed the cardioversion procedure in detail with the patient including the risks and he wishes to proceed.  #Hypertension At goal today.  Recommend checking blood pressures 1-2 times per week at home and recording the values.  Recommend bringing these recordings to the primary care physician. Continue metoprolol, losartan.  Follow-up with me in 4 to 6 weeks.       Signed, Steffanie Dunn, MD, [facility],  [facility] 11/25/2022 12:01 PM    Electrophysiology [facility]

## 2022-11-24 NOTE — Progress Notes (Unsigned)
Electrophysiology Office Follow up Visit Note:    Date:  11/25/2022   ID:  Benjamin [name], DOB 1950/04/24, MRN [id number]  PCP:  Mick Sell, MD  [facility] Cardiologist:  Hillis Range, MD (Inactive)  [facility] Electrophysiologist:  Lanier Prude, MD    Interval History:    Benjamin [name] is a 72 y.o. male who presents for a follow up visit.   He had an A-fib ablation on July 15, 2022 during which the veins, posterior wall were ablated.  He also had ablation of SVT during which the slow pathway was ablated.  He saw [place] on August 12, 2022.  At that appointment he was doing well without recurrence of arrhythmia.  He takes Xarelto for stroke prophylaxis.  Today he tells me he has been doing well.  He is not noticed a recurrence of atrial fibrillation.  He uses an Scientist, physiological for arrhythmia monitoring.  Interestingly, today I had him record tracing while in typical appearing atrial flutter in the Apple watch algorithm reported sinus rhythm.     Past medical, surgical, social and family history were reviewed.  ROS:   Please see the history of present illness.    All other systems reviewed and are negative.  EKGs/Labs/Other Studies Reviewed:    The following studies were reviewed today:    EKG Interpretation Date/Time:  Monday November 25 2022 11:31:01 EDT Ventricular Rate:  71 PR Interval:    QRS Duration:  80 QT Interval:  412 QTC Calculation: 447 R Axis:   69  Text Interpretation: Atrial flutter with variable A-V block Confirmed by Steffanie Dunn [phone]) on 11/25/2022 11:49:52 AM    Physical Exam:    VS:  BP 114/60   Pulse 71   Ht 5\' 9"  (1.753 m)   Wt 167 lb (75.8 kg)   SpO2 97%   BMI 24.66 kg/m     Wt Readings from Last 3 Encounters:  11/25/22 167 lb (75.8 kg)  08/12/22 161 lb 12.8 oz (73.4 kg)  07/15/22 160 lb (72.6 kg)     GEN:  Well nourished, well developed in no acute distress CARDIAC: RRR, no murmurs, rubs,  gallops RESPIRATORY:  Clear to auscultation without rales, wheezing or rhonchi       ASSESSMENT:    1. Paroxysmal atrial fibrillation (HCC)   2. SVT (supraventricular tachycardia) (HCC)   3. Primary hypertension    PLAN:    In order of problems listed above:  #Atrial flutter New diagnosis after previously successful atrial fibrillation and SVT ablation. Continue Xarelto Plan for cardioversion to see if this is an isolated incident or if he will go back into atrial flutter.  I discussed the cardioversion procedure in detail including the risks and he wished to proceed.  I will plan to see him back in 4 to 6 weeks.  If he has recurrence after a successful cardioversion, plan for catheter ablation.  #atrial fibrillation #SVT Doing well after his catheter ablation in June 2024.  He has gone into atrial flutter now. Continue Xarelto Plan for cardioversion.  I discussed the cardioversion procedure in detail with the patient including the risks and he wishes to proceed.  #Hypertension At goal today.  Recommend checking blood pressures 1-2 times per week at home and recording the values.  Recommend bringing these recordings to the primary care physician. Continue metoprolol, losartan.  Follow-up with me in 4 to 6 weeks.       Signed, Steffanie Dunn, MD, [facility],  [facility] 11/25/2022 12:01 PM    Electrophysiology [facility]

## 2022-11-25 ENCOUNTER — Encounter: Payer: Self-pay | Admitting: Cardiology

## 2022-11-25 ENCOUNTER — Ambulatory Visit: Payer: PPO | Attending: Cardiology | Admitting: Cardiology

## 2022-11-25 VITALS — BP 114/60 | HR 71 | Ht 69.0 in | Wt 167.0 lb

## 2022-11-25 DIAGNOSIS — I10 Essential (primary) hypertension: Secondary | ICD-10-CM | POA: Diagnosis not present

## 2022-11-25 DIAGNOSIS — I480 Paroxysmal atrial fibrillation: Secondary | ICD-10-CM

## 2022-11-25 DIAGNOSIS — I471 Supraventricular tachycardia, unspecified: Secondary | ICD-10-CM

## 2022-11-25 NOTE — Patient Instructions (Signed)
Medication Instructions:  Your physician recommends that you continue on your current medications as directed. Please refer to the Current Medication list given to you today.  *If you need a refill on your cardiac medications before your next appointment, please call your pharmacy*  Lab Work: TODAY: BMET and CBC  Testing/Procedures: Your physician has recommended that you have a Cardioversion (DCCV). Electrical Cardioversion uses a jolt of electricity to your heart either through paddles or wired patches attached to your chest. This is a controlled, usually prescheduled, procedure. Defibrillation is done under light anesthesia in the hospital, and you usually go home the day of the procedure. This is done to get your heart back into a normal rhythm. You are not awake for the procedure. Please see the instruction sheet given to you today.  Follow-Up: At [facility], you and your health needs are our priority.  As part of our continuing mission to provide you with exceptional heart care, we have created designated Provider Care Teams.  These Care Teams include your primary Cardiologist (physician) and Advanced Practice Providers (APPs -  Physician Assistants and Nurse Practitioners) who all work together to provide you with the care you need, when you need it.  Your next appointment:   4-6 weeks  Provider:   Steffanie Dunn, MD

## 2022-11-26 LAB — BASIC METABOLIC PANEL
BUN/Creatinine Ratio: 24 (ref 10–24)
BUN: 25 mg/dL (ref 8–27)
CO2: 20 mmol/L (ref 20–29)
Calcium: 9.3 mg/dL (ref 8.6–10.2)
Chloride: 103 mmol/L (ref 96–106)
Creatinine, Ser: 1.05 mg/dL (ref 0.76–1.27)
Glucose: 139 mg/dL — ABNORMAL HIGH (ref 70–99)
Potassium: 4.8 mmol/L (ref 3.5–5.2)
Sodium: 140 mmol/L (ref 134–144)
eGFR: 75 mL/min/{1.73_m2} (ref >59)

## 2022-11-26 LAB — CBC
Hematocrit: 42.5 % (ref 37.5–51.0)
Hemoglobin: 13.8 g/dL (ref 13.0–17.7)
MCH: 28.9 pg (ref 26.6–33.0)
MCHC: 32.5 g/dL (ref 31.5–35.7)
MCV: 89 fL (ref 79–97)
Platelets: 266 10*3/uL (ref 150–450)
RBC: 4.78 x10E6/uL (ref 4.14–5.80)
RDW: 13.8 % (ref 11.6–15.4)
WBC: 6.9 10*3/uL (ref 3.4–10.8)

## 2022-12-05 ENCOUNTER — Other Ambulatory Visit: Payer: Self-pay

## 2022-12-05 ENCOUNTER — Encounter: Payer: Self-pay | Admitting: Cardiovascular Disease

## 2022-12-05 ENCOUNTER — Ambulatory Visit: Payer: PPO | Admitting: Anesthesiology

## 2022-12-05 ENCOUNTER — Ambulatory Visit: Payer: Self-pay | Admitting: Anesthesiology

## 2022-12-05 ENCOUNTER — Ambulatory Visit
Admission: RE | Admit: 2022-12-05 | Discharge: 2022-12-05 | Disposition: A | Discharge status: Home / Self Care | Payer: PPO | Attending: Cardiovascular Disease | Admitting: Cardiovascular Disease

## 2022-12-05 ENCOUNTER — Encounter
Admission: RE | Disposition: A | Discharge status: Home / Self Care | Payer: Self-pay | Source: Home / Self Care | Attending: Cardiovascular Disease

## 2022-12-05 DIAGNOSIS — E876 Hypokalemia: Secondary | ICD-10-CM | POA: Insufficient documentation

## 2022-12-05 DIAGNOSIS — I4891 Unspecified atrial fibrillation: Secondary | ICD-10-CM | POA: Diagnosis not present

## 2022-12-05 DIAGNOSIS — Z87891 Personal history of nicotine dependence: Secondary | ICD-10-CM | POA: Insufficient documentation

## 2022-12-05 DIAGNOSIS — Z7984 Long term (current) use of oral hypoglycemic drugs: Secondary | ICD-10-CM | POA: Insufficient documentation

## 2022-12-05 DIAGNOSIS — I4892 Unspecified atrial flutter: Secondary | ICD-10-CM | POA: Diagnosis not present

## 2022-12-05 DIAGNOSIS — I480 Paroxysmal atrial fibrillation: Secondary | ICD-10-CM | POA: Insufficient documentation

## 2022-12-05 DIAGNOSIS — R0602 Shortness of breath: Secondary | ICD-10-CM

## 2022-12-05 DIAGNOSIS — I483 Typical atrial flutter: Secondary | ICD-10-CM | POA: Diagnosis not present

## 2022-12-05 DIAGNOSIS — E119 Type 2 diabetes mellitus without complications: Secondary | ICD-10-CM | POA: Insufficient documentation

## 2022-12-05 DIAGNOSIS — I10 Essential (primary) hypertension: Secondary | ICD-10-CM | POA: Diagnosis not present

## 2022-12-05 DIAGNOSIS — I471 Supraventricular tachycardia, unspecified: Secondary | ICD-10-CM | POA: Diagnosis not present

## 2022-12-05 DIAGNOSIS — Z01818 Encounter for other preprocedural examination: Secondary | ICD-10-CM

## 2022-12-05 DIAGNOSIS — Z7901 Long term (current) use of anticoagulants: Secondary | ICD-10-CM | POA: Insufficient documentation

## 2022-12-05 DIAGNOSIS — E785 Hyperlipidemia, unspecified: Secondary | ICD-10-CM | POA: Insufficient documentation

## 2022-12-05 HISTORY — PX: CARDIOVERSION: SHX1299

## 2022-12-05 SURGERY — CARDIOVERSION
Anesthesia: General

## 2022-12-05 MED ORDER — SODIUM CHLORIDE 0.9 % IV SOLN: INTRAVENOUS | Status: DC

## 2022-12-05 MED ORDER — PROPOFOL 10 MG/ML IV BOLUS
INTRAVENOUS | Status: DC | PRN
  Administered 2022-12-05: 70 mg via INTRAVENOUS

## 2022-12-05 MED ORDER — PROPOFOL 10 MG/ML IV BOLUS
INTRAVENOUS | Status: AC
  Filled 2022-12-05: qty 20

## 2022-12-05 NOTE — Transfer of Care (Signed)
Immediate Anesthesia Transfer of Care Note  Patient: KAMAU EBBEN  Procedure(s) Performed: CARDIOVERSION  Patient Location: Short Stay  Anesthesia Type:General  Level of Consciousness: sedated  Airway & Oxygen Therapy: Patient Spontanous Breathing and Patient connected to nasal cannula oxygen  Post-op Assessment: Report given to RN and Post -op Vital signs reviewed and stable  Post vital signs: Reviewed and stable  Last Vitals:  Vitals Value Taken Time  BP 108/65 12/05/22 0743  Temp    Pulse 58 12/05/22 0743  Resp 14 12/05/22 0743  SpO2 90 % 12/05/22 0743    Last Pain:  Vitals:   12/05/22 0653  TempSrc: Oral  PainSc: 0-No pain         Complications: No notable events documented.

## 2022-12-05 NOTE — Anesthesia Postprocedure Evaluation (Signed)
Anesthesia Post Note  Patient: Benjamin [name]  Procedure(s) Performed: CARDIOVERSION  Patient location during evaluation: Specials Recovery Anesthesia Type: General Level of consciousness: awake and alert Pain management: pain level controlled Vital Signs Assessment: post-procedure vital signs reviewed and stable Respiratory status: spontaneous breathing, nonlabored ventilation, respiratory function stable and patient connected to nasal cannula oxygen Cardiovascular status: blood pressure returned to baseline and stable Postop Assessment: no apparent nausea or vomiting Anesthetic complications: no   No notable events documented.   Last Vitals:  Vitals:   12/05/22 0800 12/05/22 0815  BP: 100/67 112/69  Pulse: (!) 58 (!) 59  Resp: 10 12  Temp:    SpO2: 95% 95%    Last Pain:  Vitals:   12/05/22 0815  TempSrc:   PainSc: 0-No pain                 Louie Boston

## 2022-12-05 NOTE — Anesthesia Preprocedure Evaluation (Signed)
Anesthesia Evaluation  Patient identified by MRN, date of birth, ID band Patient awake    Reviewed: Allergy & Precautions, NPO status , Patient's Chart, lab work & pertinent test results  History of Anesthesia Complications Negative for: history of anesthetic complications  Airway Mallampati: II  TM Distance: >3 FB Neck ROM: full    Dental no notable dental hx.    Pulmonary neg pulmonary ROS, former smoker   Pulmonary exam normal        Cardiovascular hypertension, On Medications + dysrhythmias (s/p ablation) Atrial Fibrillation and Supra Ventricular Tachycardia  Rhythm:Irregular Rate:Normal     Neuro/Psych negative neurological ROS  negative psych ROS   GI/Hepatic negative GI ROS, Neg liver ROS,,,  Endo/Other  negative endocrine ROSdiabetes, Type 2, Oral Hypoglycemic Agents    Renal/GU negative Renal ROS  negative genitourinary   Musculoskeletal   Abdominal   Peds  Hematology negative hematology ROS (+)   Anesthesia Other Findings Past Medical History: No date: Actinic keratosis No date: Atrial fibrillation (HCC)     Comment:  paroxysmal No date: Cancer (HCC)     Comment:  SCC removed from neck No date: Cough syncope No date: Diabetes mellitus No date: Dyslipidemia No date: Gout No date: Hyperlipidemia No date: Hypertension No date: Hypopotassemia 10/31/2016: Squamous cell carcinoma of skin     Comment:  right lat neck post aspect (in situ)  Past Surgical History: 07/15/2022: ATRIAL FIBRILLATION ABLATION; N/A     Comment:  Procedure: ATRIAL FIBRILLATION ABLATION;  Surgeon:               Lanier Prude, MD;  Location: [facility] INVASIVE CV LAB;                Service: Cardiovascular;  Laterality: N/A; 11/07/2016: COLONOSCOPY WITH PROPOFOL; N/A     Comment:  Procedure: COLONOSCOPY WITH PROPOFOL;  Surgeon:               Christena Deem, MD;  Location: [facility] ENDOSCOPY;                Service: Endoscopy;   Laterality: N/A; 12/2020: SHOULDER SURGERY; Right No date: TONSILLECTOMY AND ADENOIDECTOMY  BMI    Body Mass Index: 24.40 kg/m      Reproductive/Obstetrics negative OB ROS                             Anesthesia Physical Anesthesia Plan  ASA: 3  Anesthesia Plan: General   Post-op Pain Management: Minimal or no pain anticipated   Induction: Intravenous  PONV Risk Score and Plan: 1 and Propofol infusion and TIVA  Airway Management Planned: Natural Airway and Nasal Cannula  Additional Equipment:   Intra-op Plan:   Post-operative Plan:   Informed Consent: I have reviewed the patients History and Physical, chart, labs and discussed the procedure including the risks, benefits and alternatives for the proposed anesthesia with the patient or authorized representative who has indicated his/her understanding and acceptance.     Dental Advisory Given  Plan Discussed with: Anesthesiologist, CRNA and Surgeon  Anesthesia Plan Comments: (Patient consented for risks of anesthesia including but not limited to:  - adverse reactions to medications - risk of airway placement if required - damage to eyes, teeth, lips or other oral mucosa - nerve damage due to positioning  - sore throat or hoarseness - Damage to heart, brain, nerves, lungs, other parts of body or loss of life  Patient voiced  understanding and assent.)       Anesthesia Quick Evaluation

## 2022-12-05 NOTE — CV Procedure (Signed)
Cardioversion procedure note For atrial flutter, typical  Procedure Details:  Consent: Risks of procedure as well as the alternatives and risks of each were explained to the (patient/caregiver).  Consent for procedure obtained.  Time Out: Verified patient identification, verified procedure, site/side was marked, verified correct patient position, special equipment/implants available, medications/allergies/relevent history reviewed, required imaging and test results available.  Performed  Patient placed on cardiac monitor, pulse oximetry, supplemental oxygen as necessary.   Sedation given: propofol IV, Dr. Joelene Millin Pacer pads placed anterior and posterior chest.   Cardioverted 1 time(s).   Cardioverted at  150 J. Synchronized biphasic Converted to NSR   Evaluation: Findings: Post procedure EKG shows: NSR Complications: None Patient did tolerate procedure well.  Time Spent Directly with the Patient:  45 minutes   Dossie Arbour, M.D., Ph.D.

## 2022-12-06 NOTE — Interval H&P Note (Signed)
History and Physical Interval Note:  12/06/2022 3:26 PM  Benjamin [name]  has presented today for surgery, with the diagnosis of Cardioversion   Afib.  The various methods of treatment have been discussed with the patient and family. After consideration of risks, benefits and other options for treatment, the patient has consented to  Procedure(s): CARDIOVERSION (N/A) as a surgical intervention.  The patient's history has been reviewed, patient examined, no change in status, stable for surgery.  I have reviewed the patient's chart and labs.  Questions were answered to the patient's satisfaction.     Julien Nordmann

## 2022-12-06 NOTE — H&P (Signed)
H&P Addendum, pre-cardioversion ° °Patient was seen and evaluated prior to -cardioversion procedure °Symptoms, prior testing details again confirmed with the patient °Patient examined, no significant change from prior exam °Lab work reviewed in detail personally by myself °Patient understands risk and benefit of the procedure,  °The risks (stroke, cardiac arrhythmias rarely resulting in the need for a temporary or permanent pacemaker, skin irritation or burns and complications associated with conscious sedation including aspiration, arrhythmia, respiratory failure and death), benefits (restoration of normal sinus rhythm) and alternatives of a direct current cardioversion were explained in detail °Patient willing to proceed. ° °Signed, °Tim Da Michelle, MD, Ph.D °[facility]  °

## 2022-12-18 DIAGNOSIS — M5431 Sciatica, right side: Secondary | ICD-10-CM | POA: Diagnosis not present

## 2022-12-18 DIAGNOSIS — M5416 Radiculopathy, lumbar region: Secondary | ICD-10-CM | POA: Diagnosis not present

## 2022-12-18 DIAGNOSIS — M9905 Segmental and somatic dysfunction of pelvic region: Secondary | ICD-10-CM | POA: Diagnosis not present

## 2022-12-18 DIAGNOSIS — M9903 Segmental and somatic dysfunction of lumbar region: Secondary | ICD-10-CM | POA: Diagnosis not present

## 2022-12-31 NOTE — Progress Notes (Unsigned)
Electrophysiology Office Follow up Visit Note:    Date:  01/01/2023   ID:  Benjamin [name], DOB 1951-01-21, MRN [id number]  PCP:  Mick Sell, MD  [facility] Cardiologist:  Hillis Range, MD (Inactive)  [facility] Electrophysiologist:  Lanier Prude, MD    Interval History:     Benjamin [name] is a 72 y.o. male who presents for a follow up visit.   Discussed the use of AI scribe software for clinical note transcription with the patient, who gave verbal consent to proceed.   I saw the patient November 25, 2022.  He had an A-fib ablation July 15, 2022 during which the veins and posterior wall were isolated.  The slow pathway was also targeted given inducible AVNRT.  He is on Xarelto for stroke prophylaxis.  At her last appointment he was in atrial flutter which was a new diagnosis for him.  We plan for cardioversion but did discuss the possibility of needing redo catheter ablation.   History of Present Illness   The patient, with a history of arrhythmias, presents for a follow-up after a recent ablation procedure. He reports no episodes of palpitations since the procedure and has been monitoring his heart rhythm at home using an Apple Watch and Kardia device, which consistently indicate a normal sinus rhythm. He is currently taking Xarelto and has not noticed any adverse effects.  In addition to his cardiac history, the patient has a history of blood pressure issues, which are currently well controlled. He has not reported any new symptoms or concerns since his last visit.            Past medical, surgical, social and family history were reviewed.  ROS:   Please see the history of present illness.    All other systems reviewed and are negative.  EKGs/Labs/Other Studies Reviewed:    The following studies were reviewed today:  EKG Interpretation Date/Time:  Wednesday January 01 2023 11:07:31 EST Ventricular Rate:  60 PR Interval:  218 QRS  Duration:  84 QT Interval:  422 QTC Calculation: 422 R Axis:   70  Text Interpretation: Sinus rhythm with 1st degree A-V block Confirmed by Steffanie Dunn [phone]) on 01/01/2023 11:28:57 AM    Physical Exam:    VS:  BP 118/64   Pulse 60   Ht 5\' 9"  (1.753 m)   Wt 165 lb (74.8 kg)   SpO2 97%   BMI 24.37 kg/m     Wt Readings from Last 3 Encounters:  01/01/23 165 lb (74.8 kg)  12/05/22 165 lb 3.2 oz (74.9 kg)  11/25/22 167 lb (75.8 kg)     Physical Exam   GENERAL: Well appearing male, no distress. CHEST: Lungs clear to auscultation. CARDIOVASCULAR: Heart rhythm regular rate and rhythm.          ASSESSMENT:    1. Paroxysmal atrial fibrillation (HCC)   2. Primary hypertension   3. Atrial flutter, unspecified type (HCC)    PLAN:    In order of problems listed above:  Assessment and Plan    Post-Ablation Atrial Flutter Single episode of atrial flutter post-ablation, currently in normal sinus rhythm. Discussed the possibility of this being a transient event related to the healing process post-ablation. -Monitor rhythm at home using Apple Watch and Kardia device. -Continue Xarelto as prescribed. -Return in six months for follow-up, or sooner if recurrent episodes of atrial flutter are detected.  Hypertension Well controlled. -Continue current management.      Follow  up 6 months with Suzann.    Signed, Steffanie Dunn, MD, [facility], [facility] 01/01/2023 11:33 AM    Electrophysiology [facility]

## 2023-01-01 ENCOUNTER — Encounter: Payer: Self-pay | Admitting: Cardiology

## 2023-01-01 ENCOUNTER — Ambulatory Visit: Payer: PPO | Attending: Cardiology | Admitting: Cardiology

## 2023-01-01 VITALS — BP 118/64 | HR 60 | Ht 69.0 in | Wt 165.0 lb

## 2023-01-01 DIAGNOSIS — I4892 Unspecified atrial flutter: Secondary | ICD-10-CM | POA: Diagnosis not present

## 2023-01-01 DIAGNOSIS — I10 Essential (primary) hypertension: Secondary | ICD-10-CM

## 2023-01-01 DIAGNOSIS — I480 Paroxysmal atrial fibrillation: Secondary | ICD-10-CM

## 2023-01-01 NOTE — Patient Instructions (Signed)
Medication Instructions:   *If you need a refill on your cardiac medications before your next appointment, please call your pharmacy*   Lab Work:  If you have labs (blood work) drawn today and your tests are completely normal, you will receive your results only by: MyChart Message (if you have MyChart) OR A paper copy in the mail If you have any lab test that is abnormal or we need to change your treatment, we will call you to review the results.   Testing/Procedures: None   Follow-Up: At [facility], you and your health needs are our priority.  As part of our continuing mission to provide you with exceptional heart care, we have created designated Provider Care Teams.  These Care Teams include your primary Cardiologist (physician) and Advanced Practice Providers (APPs -  Physician Assistants and Nurse Practitioners) who all work together to provide you with the care you need, when you need it.  We recommend signing up for the patient portal called "MyChart".  Sign up information is provided on this After Visit Summary.  MyChart is used to connect with patients for Virtual Visits (Telemedicine).  Patients are able to view lab/test results, encounter notes, upcoming appointments, etc.  Non-urgent messages can be sent to your provider as well.   To learn more about what you can do with MyChart, go to [email or web address].    Your next appointment:   6 month(s)  Provider:   Sherie Don, NP    Other Instructions

## 2023-01-02 DIAGNOSIS — I10 Essential (primary) hypertension: Secondary | ICD-10-CM | POA: Diagnosis not present

## 2023-01-02 DIAGNOSIS — M109 Gout, unspecified: Secondary | ICD-10-CM | POA: Diagnosis not present

## 2023-01-02 DIAGNOSIS — E119 Type 2 diabetes mellitus without complications: Secondary | ICD-10-CM | POA: Diagnosis not present

## 2023-01-02 DIAGNOSIS — I4891 Unspecified atrial fibrillation: Secondary | ICD-10-CM | POA: Diagnosis not present

## 2023-01-02 DIAGNOSIS — E7849 Other hyperlipidemia: Secondary | ICD-10-CM | POA: Diagnosis not present

## 2023-01-15 DIAGNOSIS — M9903 Segmental and somatic dysfunction of lumbar region: Secondary | ICD-10-CM | POA: Diagnosis not present

## 2023-01-15 DIAGNOSIS — M9905 Segmental and somatic dysfunction of pelvic region: Secondary | ICD-10-CM | POA: Diagnosis not present

## 2023-01-15 DIAGNOSIS — M5416 Radiculopathy, lumbar region: Secondary | ICD-10-CM | POA: Diagnosis not present

## 2023-01-15 DIAGNOSIS — M5431 Sciatica, right side: Secondary | ICD-10-CM | POA: Diagnosis not present

## 2023-01-16 DIAGNOSIS — E119 Type 2 diabetes mellitus without complications: Secondary | ICD-10-CM | POA: Diagnosis not present

## 2023-01-16 DIAGNOSIS — E7849 Other hyperlipidemia: Secondary | ICD-10-CM | POA: Diagnosis not present

## 2023-01-16 DIAGNOSIS — I4891 Unspecified atrial fibrillation: Secondary | ICD-10-CM | POA: Diagnosis not present

## 2023-01-16 DIAGNOSIS — M109 Gout, unspecified: Secondary | ICD-10-CM | POA: Diagnosis not present

## 2023-01-16 DIAGNOSIS — I10 Essential (primary) hypertension: Secondary | ICD-10-CM | POA: Diagnosis not present

## 2023-02-19 DIAGNOSIS — M5416 Radiculopathy, lumbar region: Secondary | ICD-10-CM | POA: Diagnosis not present

## 2023-02-19 DIAGNOSIS — M5431 Sciatica, right side: Secondary | ICD-10-CM | POA: Diagnosis not present

## 2023-02-19 DIAGNOSIS — M9903 Segmental and somatic dysfunction of lumbar region: Secondary | ICD-10-CM | POA: Diagnosis not present

## 2023-02-19 DIAGNOSIS — M9905 Segmental and somatic dysfunction of pelvic region: Secondary | ICD-10-CM | POA: Diagnosis not present

## 2023-02-26 DIAGNOSIS — M180 Bilateral primary osteoarthritis of first carpometacarpal joints: Secondary | ICD-10-CM | POA: Diagnosis not present

## 2023-02-26 DIAGNOSIS — M79644 Pain in right finger(s): Secondary | ICD-10-CM | POA: Diagnosis not present

## 2023-02-26 DIAGNOSIS — M79645 Pain in left finger(s): Secondary | ICD-10-CM | POA: Diagnosis not present

## 2023-03-19 DIAGNOSIS — M9905 Segmental and somatic dysfunction of pelvic region: Secondary | ICD-10-CM | POA: Diagnosis not present

## 2023-03-19 DIAGNOSIS — M5416 Radiculopathy, lumbar region: Secondary | ICD-10-CM | POA: Diagnosis not present

## 2023-03-19 DIAGNOSIS — M5431 Sciatica, right side: Secondary | ICD-10-CM | POA: Diagnosis not present

## 2023-03-19 DIAGNOSIS — M9903 Segmental and somatic dysfunction of lumbar region: Secondary | ICD-10-CM | POA: Diagnosis not present

## 2023-03-24 DIAGNOSIS — H43813 Vitreous degeneration, bilateral: Secondary | ICD-10-CM | POA: Diagnosis not present

## 2023-03-24 DIAGNOSIS — H2511 Age-related nuclear cataract, right eye: Secondary | ICD-10-CM | POA: Diagnosis not present

## 2023-03-24 DIAGNOSIS — E119 Type 2 diabetes mellitus without complications: Secondary | ICD-10-CM | POA: Diagnosis not present

## 2023-03-24 DIAGNOSIS — H2512 Age-related nuclear cataract, left eye: Secondary | ICD-10-CM | POA: Diagnosis not present

## 2023-04-04 DIAGNOSIS — I10 Essential (primary) hypertension: Secondary | ICD-10-CM | POA: Diagnosis not present

## 2023-04-04 DIAGNOSIS — M109 Gout, unspecified: Secondary | ICD-10-CM | POA: Diagnosis not present

## 2023-04-04 DIAGNOSIS — I4891 Unspecified atrial fibrillation: Secondary | ICD-10-CM | POA: Diagnosis not present

## 2023-04-04 DIAGNOSIS — E7849 Other hyperlipidemia: Secondary | ICD-10-CM | POA: Diagnosis not present

## 2023-04-04 DIAGNOSIS — E119 Type 2 diabetes mellitus without complications: Secondary | ICD-10-CM | POA: Diagnosis not present

## 2023-04-08 ENCOUNTER — Other Ambulatory Visit: Payer: Self-pay

## 2023-04-30 DIAGNOSIS — M25552 Pain in left hip: Secondary | ICD-10-CM | POA: Diagnosis not present

## 2023-04-30 DIAGNOSIS — M25551 Pain in right hip: Secondary | ICD-10-CM | POA: Diagnosis not present

## 2023-05-07 DIAGNOSIS — M9905 Segmental and somatic dysfunction of pelvic region: Secondary | ICD-10-CM | POA: Diagnosis not present

## 2023-05-07 DIAGNOSIS — M5431 Sciatica, right side: Secondary | ICD-10-CM | POA: Diagnosis not present

## 2023-05-07 DIAGNOSIS — M5416 Radiculopathy, lumbar region: Secondary | ICD-10-CM | POA: Diagnosis not present

## 2023-05-07 DIAGNOSIS — M9903 Segmental and somatic dysfunction of lumbar region: Secondary | ICD-10-CM | POA: Diagnosis not present

## 2023-06-04 DIAGNOSIS — M9905 Segmental and somatic dysfunction of pelvic region: Secondary | ICD-10-CM | POA: Diagnosis not present

## 2023-06-04 DIAGNOSIS — M9903 Segmental and somatic dysfunction of lumbar region: Secondary | ICD-10-CM | POA: Diagnosis not present

## 2023-06-04 DIAGNOSIS — M5431 Sciatica, right side: Secondary | ICD-10-CM | POA: Diagnosis not present

## 2023-06-04 DIAGNOSIS — M5416 Radiculopathy, lumbar region: Secondary | ICD-10-CM | POA: Diagnosis not present

## 2023-06-15 ENCOUNTER — Ambulatory Visit
Admission: RE | Admit: 2023-06-15 | Discharge: 2023-06-15 | Disposition: A | Discharge status: Home / Self Care | Source: Ambulatory Visit | Attending: Emergency Medicine | Admitting: Emergency Medicine

## 2023-06-15 VITALS — BP 134/75 | HR 64 | Temp 97.9°F | Resp 14

## 2023-06-15 DIAGNOSIS — J069 Acute upper respiratory infection, unspecified: Secondary | ICD-10-CM

## 2023-06-15 LAB — GROUP A STREP BY PCR: Group A Strep by PCR: NOT DETECTED

## 2023-06-15 MED ORDER — IPRATROPIUM BROMIDE 0.06 % NA SOLN: 2.0000 | Freq: Four times a day (QID) | NASAL | 12 refills | Status: DC

## 2023-06-15 MED ORDER — BENZONATATE 100 MG PO CAPS: 200.0000 mg | ORAL_CAPSULE | Freq: Three times a day (TID) | ORAL | 0 refills | Status: DC

## 2023-06-15 MED ORDER — AMOXICILLIN-POT CLAVULANATE 875-125 MG PO TABS: 1.0000 | ORAL_TABLET | Freq: Two times a day (BID) | ORAL | 0 refills | Status: AC

## 2023-06-15 MED ORDER — PROMETHAZINE-DM 6.25-15 MG/5ML PO SYRP: 5.0000 mL | ORAL_SOLUTION | Freq: Four times a day (QID) | ORAL | 0 refills | Status: DC | PRN

## 2023-06-15 NOTE — ED Triage Notes (Signed)
 Sx x 2 days  Sore throat worse at night Bilateral pain

## 2023-06-15 NOTE — ED Provider Notes (Signed)
 [facility]    CSN: [id number] Arrival date & time: 06/15/23  1143      History   Chief Complaint Chief Complaint  Patient presents with   Sore Throat    ear ache - Entered by patient   Otalgia    HPI Benjamin [name] is a 73 y.o. male.   HPI  73 year old male with past medical history significant for hypertension, atrial flutter, hypokalemia, dyslipidemia, gout, allergic rhinitis, Raynaud's phenomenon, and type 2 diabetes presents for evaluation of a sore throat that is worse at night as well as bilateral ear pain.  He also endorses that he has had slight runny nose and nasal congestion which has been going on for over a month and he attributes that to allergies.  He does have an infrequent cough that is occasionally productive for clear sputum.  He denies any headache, body aches, fever, shortness breath, wheezing, or GI complaints.  No known sick contacts or recent travel.  Past Medical History:  Diagnosis Date   Actinic keratosis    Atrial fibrillation (HCC)    paroxysmal   Cancer (HCC)    SCC removed from neck   Cough syncope    Diabetes mellitus    Dyslipidemia    Gout    Hyperlipidemia    Hypertension    Hypopotassemia    Squamous cell carcinoma of skin 10/31/2016   right lat neck post aspect (in situ)    Patient Active Problem List   Diagnosis Date Noted   Typical atrial flutter (HCC) 12/05/2022   SOB (shortness of breath) 12/05/2022   Hypercoagulable state due to paroxysmal atrial fibrillation (HCC) 03/03/2019   Hypertension    Hyperlipidemia    Dyslipidemia    Cough syncope    Allergic rhinitis 07/04/2014   Arthritis 07/04/2014   Back pain, chronic 07/04/2014   Abnormal glucose tolerance test 07/04/2014   HLD (hyperlipidemia) 07/04/2014   Raynaud's syndrome 07/04/2014   Diabetes mellitus, type 2 (HCC) 07/04/2014   UNSPECIFIED ACUTE PERICARDITIS 01/23/2009   ATRIAL FIBRILLATION 01/23/2009   COUGH SYNCOPE 01/23/2009   DYSLIPIDEMIA  01/20/2009   Gout 01/20/2009    Past Surgical History:  Procedure Laterality Date   ATRIAL FIBRILLATION ABLATION N/A 07/15/2022   Procedure: ATRIAL FIBRILLATION ABLATION;  Surgeon: Boyce Byes, MD;  Location: [facility];  Service: Cardiovascular;  Laterality: N/A;   CARDIOVERSION N/A 12/05/2022   Procedure: CARDIOVERSION;  Surgeon: Devorah Fonder, MD;  Location: [facility] ORS;  Service: Cardiovascular;  Laterality: N/A;   COLONOSCOPY WITH PROPOFOL  N/A 11/07/2016   Procedure: COLONOSCOPY WITH PROPOFOL ;  Surgeon: Deveron Fly, MD;  Location: [facility] ENDOSCOPY;  Service: Endoscopy;  Laterality: N/A;   SHOULDER SURGERY Right 12/2020   TONSILLECTOMY AND ADENOIDECTOMY         Home Medications    Prior to Admission medications   Medication Sig Start Date End Date Taking? Authorizing Provider  amoxicillin-clavulanate (AUGMENTIN) 875-125 MG tablet Take 1 tablet by mouth every 12 (twelve) hours for 7 days. 06/15/23 06/22/23 Yes Kent Pear, NP  Ascorbic Acid (VITAMIN C PO) Take 1 tablet by mouth daily.   Yes [provider]  benzonatate  (TESSALON ) 100 MG capsule Take 2 capsules (200 mg total) by mouth every 8 (eight) hours. 06/15/23  Yes Kent Pear, NP  Cholecalciferol (VITAMIN D3 PO) Take 1 tablet by mouth daily.   Yes [provider]  ipratropium (ATROVENT) 0.06 % nasal spray Place 2 sprays into both nostrils 4 (four) times daily.  06/15/23  Yes Kent Pear, NP  JARDIANCE  25 MG TABS tablet Take 25 mg by mouth daily. 07/19/22  Yes [provider]  losartan  (COZAAR ) 25 MG tablet TAKE 1 TABLET BY MOUTH DAILY 11/16/21  Yes Simmons-Robinson, Makiera, MD  METAMUCIL FIBER PO Take 5 capsules by mouth at bedtime.   Yes [provider]  metFORMIN  (GLUCOPHAGE -XR) 500 MG 24 hr tablet TAKE TWO TABLETS TWICE DAILY 08/11/21  Yes Gilbert, Richard L, MD  Multiple Vitamin (MULTIVITAMIN PO) Take 1 tablet by mouth daily. Centrum for Men   Yes [provider]   pioglitazone (ACTOS) 15 MG tablet Take 30 mg by mouth daily. 10/09/22 10/09/23 Yes [provider]  promethazine-dextromethorphan (PROMETHAZINE-DM) 6.25-15 MG/5ML syrup Take 5 mLs by mouth 4 (four) times daily as needed. 06/15/23  Yes Kent Pear, NP  rosuvastatin  (CRESTOR ) 20 MG tablet Take 1 tablet (20 mg total) by mouth daily. 01/07/22  Yes Kandis Ormond, DO  tadalafil  (CIALIS ) 20 MG tablet Take by mouth as needed for erectile dysfunction. 10/09/22  Yes [provider]  traZODone  (DESYREL ) 100 MG tablet TAKE ONE TABLET BY MOUTH AT BEDTIME AS NEEDED FOR SLEEP 07/02/21  Yes Nikki Barters, MD  TURMERIC PO Take 1 tablet by mouth daily.   Yes [provider]  XARELTO  20 MG TABS tablet TAKE 1 TABLET BY MOUTH DAILY WITH SUPPER 01/11/22  Yes Allred, Royston Cornea, MD  allopurinol  (ZYLOPRIM ) 300 MG tablet TAKE 1 TABLET BY MOUTH TWICE DAILY Patient taking differently: Take 150 mg by mouth daily. 09/11/21   Nikki Barters, MD  glucose blood (ONETOUCH VERIO) test strip CHECK BLOOD SUGAR DAILY 01/24/22   Simmons-Robinson, Judyann Number, MD  hydrocortisone  2.5 % cream Apply to face on Tuesday, Thursday, and Saturday QD. 11/07/22   Elta Halter, MD  ketoconazole  (NIZORAL ) 2 % cream Apply topically to face nightly on m-w-f for seb derm 10/23/22   Elta Halter, MD  metoprolol  tartrate (LOPRESSOR ) 50 MG tablet Take one-half tablet (25 mg) by mouth twice daily as needed for palpitations and/or fast heart rates 03/03/20   Allred, Royston Cornea, MD  sildenafil  (REVATIO ) 20 MG tablet TAKE 1 TABLET 3 TIMES A DAY AS NEEDED 01/29/21   Drubel, Heidi Llamas, PA-C  zolpidem (AMBIEN) 5 MG tablet Take 5 mg by mouth at bedtime as needed for sleep. 06/03/22   [provider]    Family History Family History  Problem Relation Age of Onset   Atrial fibrillation Mother    Ovarian cancer Mother    Congestive Heart Failure Mother    Aneurysm Mother    Heart attack Father    Diabetes Father     Hypertension Father    Ulcers Father    Gout Father    CAD Father    Other Son        ITP   Gout Son    Other Son        ITP   Hearing loss Son     Social History Social History   Tobacco Use   Smoking status: Former    Current packs/day: 0.00    Types: Cigarettes    Quit date: 01/23/1971    Years since quitting: 52.4   Smokeless tobacco: Never  Vaping Use   Vaping status: Never Used  Substance Use Topics   Alcohol use: Yes    Alcohol/week: 4.0 standard drinks of alcohol    Types: 4 Glasses of wine per week    Comment: only on weekends  Drug use: No     Allergies   Patient has no known allergies.   Review of Systems Review of Systems  Constitutional:  Negative for fever.  HENT:  Positive for congestion, ear pain, rhinorrhea and sore throat. Negative for ear discharge, hearing loss and tinnitus.   Respiratory:  Positive for cough. Negative for shortness of breath and wheezing.   Musculoskeletal:  Negative for arthralgias and myalgias.  Neurological:  Negative for headaches.     Physical Exam Triage Vital Signs ED Triage Vitals  Encounter Vitals Group     BP      Systolic BP Percentile      Diastolic BP Percentile      Pulse      Resp      Temp      Temp src      SpO2      Weight      Height      Head Circumference      Peak Flow      Pain Score      Pain Loc      Pain Education      Exclude from Growth Chart    No data found.  Updated Vital Signs BP 134/75 (BP Location: Right Arm)   Pulse 64   Temp 97.9 F (36.6 C) (Oral)   Resp 14   SpO2 96%   Visual Acuity Right Eye Distance:   Left Eye Distance:   Bilateral Distance:    Right Eye Near:   Left Eye Near:    Bilateral Near:     Physical Exam Vitals and nursing note reviewed.  Constitutional:      Appearance: Normal appearance. He is not ill-appearing.  HENT:     Head: Normocephalic and atraumatic.     Right Ear: Tympanic membrane, ear canal and external ear normal. There is  no impacted cerumen.     Left Ear: Tympanic membrane, ear canal and external ear normal. There is no impacted cerumen.     Ears:     Comments: Both tympanic membranes are partially visible and they are pearly gray in appearance.  There is moderate amount of cerumen in both external auditory canals but it is not occluding the canal.    Nose: Congestion and rhinorrhea present.     Comments: Nasal mucosa is erythematous and mildly edematous with scant yellow discharge in both nares.    Mouth/Throat:     Mouth: Mucous membranes are moist.     Pharynx: Oropharynx is clear. Posterior oropharyngeal erythema present. No oropharyngeal exudate.     Comments: Soft palate and tonsillar pillars unremarkable.  Posterior pharynx demonstrates erythema with clear postnasal drip. Cardiovascular:     Rate and Rhythm: Normal rate and regular rhythm.     Pulses: Normal pulses.     Heart sounds: Normal heart sounds. No murmur heard.    No friction rub. No gallop.  Pulmonary:     Effort: Pulmonary effort is normal.     Breath sounds: Normal breath sounds. No wheezing, rhonchi or rales.  Musculoskeletal:     Cervical back: Normal range of motion and neck supple. No tenderness.  Lymphadenopathy:     Cervical: No cervical adenopathy.  Skin:    General: Skin is warm and dry.     Capillary Refill: Capillary refill takes less than 2 seconds.     Findings: No rash.  Neurological:     General: No focal deficit present.     Mental  Status: He is alert and oriented to person, place, and time.      UC Treatments / Results  Labs (all labs ordered are listed, but only abnormal results are displayed) Labs Reviewed  GROUP A STREP BY PCR    EKG   Radiology No results found.  Procedures Procedures (including critical care time)  Medications Ordered in UC Medications - No data to display  Initial Impression / Assessment and Plan / UC Course  I have reviewed the triage vital signs and the nursing  notes.  Pertinent labs & imaging results that were available during my care of the patient were reviewed by me and considered in my medical decision making (see chart for details).   Patient is a pleasant, nontoxic-appearing 73 year old male presenting for evaluation of sore throat and ear pain as outlined in HPI above.  His physical exam does reveal inflammation of his upper respiratory tract but no evidence of otitis media.  Also no tonsillar erythema, hypertrophy, or exudate.  Cardiopulmonary exam is benign.  Differential diagnose include allergic rhinitis with postnasal drip, URI, and strep pharyngitis.  I will order a strep PCR.  Strep PCR is negative.  I will discharge patient home with a diagnosis of URI with cough and congestion and start him on Augmentin 875 mg twice daily for 7 days.  Also intranasal spray for the nasal congestion.  Tessalon  Perles and Promethazine DM cough syrup for cough and congestion.  Return precautions reviewed.   Final Clinical Impressions(s) / UC Diagnoses   Final diagnoses:  URI with cough and congestion     Discharge Instructions      Your strep test today was negative but your physical exam does reveal that you have an upper respiratory tract infection.  Take the Augmentin 875 mg twice daily with food for 7 days for treatment of your URI.  Use over-the-counter Tylenol  and/or ibuprofen Corda pack instructions as needed for any fever or pain.  Use the Atrovent nasal spray, 2 squirts in each nostril every 6 hours, as needed for runny nose and postnasal drip.  Use the Tessalon  Perles every 8 hours during the day.  Take them with a small sip of water.  They may give you some numbness to the base of your tongue or a metallic taste in your mouth, this is normal.  Use the Promethazine DM cough syrup at bedtime for cough and congestion.  It will make you drowsy so do not take it during the day.  Return for reevaluation or see your primary care provider  for any new or worsening symptoms.      ED Prescriptions     Medication Sig Dispense Auth. Provider   amoxicillin-clavulanate (AUGMENTIN) 875-125 MG tablet Take 1 tablet by mouth every 12 (twelve) hours for 7 days. 14 tablet Kent Pear, NP   benzonatate  (TESSALON ) 100 MG capsule Take 2 capsules (200 mg total) by mouth every 8 (eight) hours. 21 capsule Kent Pear, NP   ipratropium (ATROVENT) 0.06 % nasal spray Place 2 sprays into both nostrils 4 (four) times daily. 15 mL Kent Pear, NP   promethazine-dextromethorphan (PROMETHAZINE-DM) 6.25-15 MG/5ML syrup Take 5 mLs by mouth 4 (four) times daily as needed. 118 mL Kent Pear, NP      PDMP not reviewed this encounter.   Kent Pear, NP 06/15/23 1230

## 2023-06-15 NOTE — Discharge Instructions (Addendum)
 Your strep test today was negative but your physical exam does reveal that you have an upper respiratory tract infection.  Take the Augmentin 875 mg twice daily with food for 7 days for treatment of your URI.  Use over-the-counter Tylenol  and/or ibuprofen Corda pack instructions as needed for any fever or pain.  Use the Atrovent nasal spray, 2 squirts in each nostril every 6 hours, as needed for runny nose and postnasal drip.  Use the Tessalon  Perles every 8 hours during the day.  Take them with a small sip of water.  They may give you some numbness to the base of your tongue or a metallic taste in your mouth, this is normal.  Use the Promethazine DM cough syrup at bedtime for cough and congestion.  It will make you drowsy so do not take it during the day.  Return for reevaluation or see your primary care provider for any new or worsening symptoms.

## 2023-07-01 ENCOUNTER — Ambulatory Visit: Admitting: Cardiology

## 2023-07-02 DIAGNOSIS — M5431 Sciatica, right side: Secondary | ICD-10-CM | POA: Diagnosis not present

## 2023-07-02 DIAGNOSIS — M9903 Segmental and somatic dysfunction of lumbar region: Secondary | ICD-10-CM | POA: Diagnosis not present

## 2023-07-02 DIAGNOSIS — M5416 Radiculopathy, lumbar region: Secondary | ICD-10-CM | POA: Diagnosis not present

## 2023-07-02 DIAGNOSIS — M9905 Segmental and somatic dysfunction of pelvic region: Secondary | ICD-10-CM | POA: Diagnosis not present

## 2023-07-03 ENCOUNTER — Ambulatory Visit: Attending: Cardiology | Admitting: Cardiology

## 2023-07-03 VITALS — BP 120/70 | HR 65 | Ht 69.0 in | Wt 166.6 lb

## 2023-07-03 DIAGNOSIS — I483 Typical atrial flutter: Secondary | ICD-10-CM | POA: Diagnosis not present

## 2023-07-03 DIAGNOSIS — I480 Paroxysmal atrial fibrillation: Secondary | ICD-10-CM | POA: Diagnosis not present

## 2023-07-03 DIAGNOSIS — D6869 Other thrombophilia: Secondary | ICD-10-CM

## 2023-07-03 NOTE — Patient Instructions (Signed)
 Lab Work: CBC BMP  - ask your PCP to add to your lab work next week   Your next appointment:   1 year(s)  Provider:   Harvie Liner, MD or Sanya Kobrin, NP    We recommend signing up for the patient portal called "MyChart".  Sign up information is provided on this After Visit Summary.  MyChart is used to connect with patients for Virtual Visits (Telemedicine).  Patients are able to view lab/test results, encounter notes, upcoming appointments, etc.  Non-urgent messages can be sent to your provider as well.   To learn more about what you can do with MyChart, go to [email or web address].

## 2023-07-03 NOTE — Progress Notes (Signed)
 Electrophysiology Clinic Note    Date:  07/03/2023  Patient ID:  Benjamin [name], Benjamin [name] 06/17/1950, MRN [id number] PCP:  Eartha Gold, MD  Cardiologist:  Jolly Needle, MD (Inactive) Electrophysiologist: Boyce Byes, MD   Discussed the use of AI scribe software for clinical note transcription with the patient, who gave verbal consent to proceed.   Patient Profile    Chief Complaint: Afib, flutter follow-up  History of Present Illness: RAHEEM KOLBE is a 73 y.o. male with PMH notable for parox Afib, aflut, SVT, HTN, T2DM; seen today for Boyce Byes, MD for routine electrophysiology followup.  He is s/p AF ablation with isolation of pulm veins, posterior wall, and the slow pathway was also targeted given inducible AVNRT on 07/2022.  Interestingly, he had new aflutter after the ablation. He last saw Dr. Marven Slimmer 12/2022, and was doing well without any arrhythmia episodes. Using apple watch to monitor.   On follow-up today, he feels very well from a cardiac standpoint. He has not had any further episodes of atrial flutter or afib that he is aware of. Continues to wear apple watch to monitor.  He continues to take xarelto  daily, no bleeding concerns.  He has follow-up labwork for PCP next week with PCP appt the following week.   He denies chest pain, chest pressure, palpitations. He does rarely get dizzy when he stands up quickly.   Arrhythmia/Device History No specialty comments available.    ROS:  Please see the history of present illness. All other systems are reviewed and otherwise negative.    Physical Exam    VS:  BP 120/70 (BP Location: Left Arm, Patient Position: Sitting, Cuff Size: Normal)   Pulse 65   Ht 5\' 9"  (1.753 m)   Wt 166 lb 9.6 oz (75.6 kg)   SpO2 99%   BMI 24.60 kg/m  BMI: Body mass index is 24.6 kg/m.  Wt Readings from Last 3 Encounters:  07/03/23 166 lb 9.6 oz (75.6 kg)  01/01/23 165 lb (74.8 kg)  12/05/22 165 lb 3.2 oz (74.9 kg)      GEN- The patient is well appearing, alert and oriented x 3 today.   Lungs- Clear to ausculation bilaterally, normal work of breathing.  Heart- Regular rate and rhythm, no murmurs, rubs or gallops Extremities- No peripheral edema, warm, dry    Studies Reviewed   Previous EP, cardiology notes.    EKG is ordered. Personal review of EKG from today shows:    EKG Interpretation Date/Time:  Thursday Jul 03 2023 13:05:04 EDT Ventricular Rate:  65 PR Interval:  216 QRS Duration:  84 QT Interval:  416 QTC Calculation: 432 R Axis:   34  Text Interpretation: Sinus rhythm with 1st degree A-V block Confirmed by Latacha Texeira [phone]) on 07/03/2023 1:06:53 PM     Cardiac CT, 07/09/2022 1. There is normal pulmonary vein drainage into the left atrium with ostial measurements above.  2. There is no thrombus in the left atrial appendage. Filling defect seen in the distal LA appendage on initial contrast images that clears on delayed images. 3. The esophagus runs in the left atrial midline and is not in proximity to any of the pulmonary vein ostia.  4. No PFO/ASD.  5. Normal coronary origin. Right dominance.  6. CAC score of 1299 Agatston units which is 85th percentile for age-, race-, and sex-matched controls.  7.  Patchy pericardial calcification noted.  8.  Ascending aorta mildly dilated to 3.9  cm.  TTE, 03/06/2016 - Left ventricle: The cavity size was normal. Wall thickness was normal. Systolic function was normal. The estimated ejection fraction was in the range of 60% to 65%. Wall motion was normal; there were no regional wall motion abnormalities. Doppler parameters are consistent with abnormal left ventricular relaxation (grade 1 diastolic dysfunction).  - Aortic valve: There was no stenosis.  - Aorta: Ascending aortic diameter: 38 mm (S).  - Ascending aorta: The ascending aorta was mildly dilated.  - Mitral valve: There was trivial regurgitation.  - Right ventricle: The cavity size  was normal. Systolic function was normal.  - Tricuspid valve: Peak RV-RA gradient (S): 18 mm Hg.  - Pulmonary arteries: PA peak pressure: 23 mm Hg (S).  - Inferior vena cava: The vessel was normal in size. The respirophasic diameter changes were in the normal range (>= 50%), consistent with normal central venous pressure.   Impressions:   - Normal LV size with EF 60-65%. Normal RV size and systolic function. No significant valvular abnormalities.      Assessment and Plan     #) parox AFib #) Aflutter S/p AFib, SVT ablation 07/2022 with Aflutter noted after procedure No further atrial arrhythmias noted on apple watch   #) Hypercoag d/t parox afib CHA2DS2-VASc Score = at least 4 [CHF History: 0, HTN History: 1, Diabetes History: 1, Stroke History: 0, Vascular Disease History: 1, Age Score: 1, Gender Score: 0].  Therefore, the patient's annual risk of stroke is 4.8 %.    Stroke ppx - 20mg  xarelto  daily, appropriately dosed No bleeding concerns Due for updated CBC, BMP. Patient having labs next week, will follow results        Current medicines are reviewed at length with the patient today.   The patient does not have concerns regarding his medicines.  The following changes were made today:  none  Labs/ tests ordered today include:  Orders Placed This Encounter  Procedures   EKG 12-Lead     Disposition: Follow up with Dr. Marven Slimmer or EP APP in 12 months   Signed, Gladine Plude, NP  07/03/23  1:34 PM  Electrophysiology [facility]

## 2023-07-06 ENCOUNTER — Ambulatory Visit
Admission: RE | Admit: 2023-07-06 | Discharge: 2023-07-06 | Disposition: A | Discharge status: Home / Self Care | Source: Ambulatory Visit | Attending: Family Medicine | Admitting: Family Medicine

## 2023-07-06 VITALS — BP 115/64 | HR 65 | Temp 97.7°F | Resp 15 | Ht 69.0 in | Wt 166.7 lb

## 2023-07-06 DIAGNOSIS — L255 Unspecified contact dermatitis due to plants, except food: Secondary | ICD-10-CM | POA: Diagnosis not present

## 2023-07-06 MED ORDER — PREDNISONE 10 MG PO TABS: ORAL_TABLET | ORAL | 0 refills | Status: DC

## 2023-07-06 NOTE — ED Triage Notes (Signed)
 Patient reports red itchy rash on his right arm and both legs that started on Wed.

## 2023-07-06 NOTE — ED Provider Notes (Signed)
 [facility]    CSN: [id number] Arrival date & time: 07/06/23  1014      History   Chief Complaint Chief Complaint  Patient presents with   Rash    HPI Benjamin [name] is a 73 y.o. male who presents for evaluation of rash.  Started on Wednesday.  Patient believes that he got into contact with poison oak poison ivy.  Has not improved with topical triamcinolone .  Located on the right arm and lower legs.  Has underlying type 2 diabetes.  Type 2 diabetes has been well-controlled.  Past Medical History:  Diagnosis Date   Actinic keratosis    Atrial fibrillation (HCC)    paroxysmal   Cancer (HCC)    SCC removed from neck   Cough syncope    Diabetes mellitus    Dyslipidemia    Gout    Hyperlipidemia    Hypertension    Hypopotassemia    Squamous cell carcinoma of skin 10/31/2016   right lat neck post aspect (in situ)    Patient Active Problem List   Diagnosis Date Noted   Typical atrial flutter (HCC) 12/05/2022   SOB (shortness of breath) 12/05/2022   Hypercoagulable state due to paroxysmal atrial fibrillation (HCC) 03/03/2019   Hypertension    Hyperlipidemia    Dyslipidemia    Cough syncope    Allergic rhinitis 07/04/2014   Arthritis 07/04/2014   Back pain, chronic 07/04/2014   Abnormal glucose tolerance test 07/04/2014   HLD (hyperlipidemia) 07/04/2014   Raynaud's syndrome 07/04/2014   Diabetes mellitus, type 2 (HCC) 07/04/2014   UNSPECIFIED ACUTE PERICARDITIS 01/23/2009   ATRIAL FIBRILLATION 01/23/2009   COUGH SYNCOPE 01/23/2009   DYSLIPIDEMIA 01/20/2009   Gout 01/20/2009    Past Surgical History:  Procedure Laterality Date   ATRIAL FIBRILLATION ABLATION N/A 07/15/2022   Procedure: ATRIAL FIBRILLATION ABLATION;  Surgeon: Boyce Byes, MD;  Location: [facility];  Service: Cardiovascular;  Laterality: N/A;   CARDIOVERSION N/A 12/05/2022   Procedure: CARDIOVERSION;  Surgeon: Devorah Fonder, MD;  Location: [facility];  Service:  Cardiovascular;  Laterality: N/A;   COLONOSCOPY WITH PROPOFOL  N/A 11/07/2016   Procedure: COLONOSCOPY WITH PROPOFOL ;  Surgeon: Deveron Fly, MD;  Location: [facility];  Service: Endoscopy;  Laterality: N/A;   SHOULDER SURGERY Right 12/2020   TONSILLECTOMY AND ADENOIDECTOMY         Home Medications    Prior to Admission medications   Medication Sig Start Date End Date Taking? Authorizing Provider  predniSONE (DELTASONE) 10 MG tablet 50 mg daily x 3 days, then 40 mg daily x 3 days, then 30 mg daily x 3 days, then 20 mg daily x 3 days, then 10 mg daily x 3 days. 07/06/23  Yes Chrysten Woulfe G, DO  allopurinol  (ZYLOPRIM ) 300 MG tablet TAKE 1 TABLET BY MOUTH TWICE DAILY Patient taking differently: Take 150 mg by mouth daily. 09/11/21   Nikki Barters, MD  Ascorbic Acid (VITAMIN C PO) Take 1 tablet by mouth daily.    [provider]  Cholecalciferol (VITAMIN D3 PO) Take 1 tablet by mouth daily.    [provider]  glucose blood (ONETOUCH VERIO) test strip CHECK BLOOD SUGAR DAILY 01/24/22   Simmons-Robinson, Judyann Number, MD  hydrocortisone  2.5 % cream Apply to face on Tuesday, Thursday, and Saturday QD. 11/07/22   Elta Halter, MD  JARDIANCE  25 MG TABS tablet Take 25 mg by mouth daily. 07/19/22   [provider]  ketoconazole  (NIZORAL )  2 % cream Apply topically to face nightly on m-w-f for seb derm 10/23/22   Elta Halter, MD  losartan  (COZAAR ) 25 MG tablet TAKE 1 TABLET BY MOUTH DAILY 11/16/21   Simmons-Robinson, Judyann Number, MD  METAMUCIL FIBER PO Take 5 capsules by mouth at bedtime.    [provider]  metFORMIN  (GLUCOPHAGE -XR) 500 MG 24 hr tablet TAKE TWO TABLETS TWICE DAILY 08/11/21   Gilbert, Richard L, MD  Multiple Vitamin (MULTIVITAMIN PO) Take 1 tablet by mouth daily. Centrum for Men    [provider]  pioglitazone (ACTOS) 15 MG tablet Take 30 mg by mouth daily. 10/09/22 10/09/23  [provider]  rosuvastatin  (CRESTOR ) 20 MG  tablet Take 1 tablet (20 mg total) by mouth daily. 01/07/22   Rumball, Alison M, DO  sildenafil  (REVATIO ) 20 MG tablet TAKE 1 TABLET 3 TIMES A DAY AS NEEDED 01/29/21   Trenton Frock, PA-C  tadalafil  (CIALIS ) 20 MG tablet Take by mouth as needed for erectile dysfunction. 10/09/22   [provider]  traZODone  (DESYREL ) 100 MG tablet TAKE ONE TABLET BY MOUTH AT BEDTIME AS NEEDED FOR SLEEP 07/02/21   Nikki Barters, MD  TURMERIC PO Take 1 tablet by mouth daily.    [provider]  XARELTO  20 MG TABS tablet TAKE 1 TABLET BY MOUTH DAILY WITH SUPPER 01/11/22   Allred, Royston Cornea, MD  zolpidem (AMBIEN) 5 MG tablet Take 5 mg by mouth at bedtime as needed for sleep. 06/03/22   [provider]    Family History Family History  Problem Relation Age of Onset   Atrial fibrillation Mother    Ovarian cancer Mother    Congestive Heart Failure Mother    Aneurysm Mother    Heart attack Father    Diabetes Father    Hypertension Father    Ulcers Father    Gout Father    CAD Father    Other Son        ITP   Gout Son    Other Son        ITP   Hearing loss Son     Social History Social History   Tobacco Use   Smoking status: Former    Current packs/day: 0.00    Types: Cigarettes    Quit date: 01/23/1971    Years since quitting: 52.4   Smokeless tobacco: Never  Vaping Use   Vaping status: Never Used  Substance Use Topics   Alcohol use: Yes    Alcohol/week: 4.0 standard drinks of alcohol    Types: 4 Glasses of wine per week    Comment: only on weekends   Drug use: No     Allergies   Patient has no known allergies.   Review of Systems Review of Systems  Skin:  Positive for rash.     Physical Exam Triage Vital Signs ED Triage Vitals  Encounter Vitals Group     BP 07/06/23 1021 115/64     Systolic BP Percentile --      Diastolic BP Percentile --      Pulse Rate 07/06/23 1021 65     Resp 07/06/23 1021 15     Temp 07/06/23 1021 97.7 F (36.5 C)      Temp Source 07/06/23 1021 Oral     SpO2 07/06/23 1021 97 %     Weight 07/06/23 1019 166 lb 10.7 oz (75.6 kg)     Height 07/06/23 1019 5\' 9"  (1.753 m)     Head  Circumference --      Peak Flow --      Pain Score --      Pain Loc --      Pain Education --      Exclude from Growth Chart --    No data found.  Updated Vital Signs BP 115/64 (BP Location: Left Arm)   Pulse 65   Temp 97.7 F (36.5 C) (Oral)   Resp 15   Ht 5\' 9"  (1.753 m)   Wt 75.6 kg   SpO2 97%   BMI 24.61 kg/m   Visual Acuity Right Eye Distance:   Left Eye Distance:   Bilateral Distance:    Right Eye Near:   Left Eye Near:    Bilateral Near:     Physical Exam Vitals and nursing note reviewed.  Constitutional:      General: He is not in acute distress.    Appearance: Normal appearance.  HENT:     Head: Normocephalic and atraumatic.  Skin:    Comments: Erythematous vesicular rash noted on the right upper extremity as well as the lower extremities bilaterally.  Neurological:     Mental Status: He is alert.      UC Treatments / Results  Labs (all labs ordered are listed, but only abnormal results are displayed) Labs Reviewed - No data to display  EKG   Radiology No results found.  Procedures Procedures (including critical care time)  Medications Ordered in UC Medications - No data to display  Initial Impression / Assessment and Plan / UC Course  I have reviewed the triage vital signs and the nursing notes.  Pertinent labs & imaging results that were available during my care of the patient were reviewed by me and considered in my medical decision making (see chart for details).    73 year old male presents with dermatitis secondary to poison oak or poison ivy.  Treating with prednisone.  Advised to monitor sugars regularly.  Prednisone likely to elevate blood sugars.  Patient aware.  Final Clinical Impressions(s) / UC Diagnoses   Final diagnoses:  Dermatitis due to plants, including  poison ivy, sumac, and oak   Discharge Instructions   None    ED Prescriptions     Medication Sig Dispense Auth. Provider   predniSONE (DELTASONE) 10 MG tablet 50 mg daily x 3 days, then 40 mg daily x 3 days, then 30 mg daily x 3 days, then 20 mg daily x 3 days, then 10 mg daily x 3 days. 45 tablet Kirstine Jacquin G, DO      PDMP not reviewed this encounter.   Christinea Brizuela G, [place] 07/06/23 1201

## 2023-07-10 DIAGNOSIS — I10 Essential (primary) hypertension: Secondary | ICD-10-CM | POA: Diagnosis not present

## 2023-07-10 DIAGNOSIS — E119 Type 2 diabetes mellitus without complications: Secondary | ICD-10-CM | POA: Diagnosis not present

## 2023-07-10 DIAGNOSIS — I4891 Unspecified atrial fibrillation: Secondary | ICD-10-CM | POA: Diagnosis not present

## 2023-07-10 DIAGNOSIS — E7849 Other hyperlipidemia: Secondary | ICD-10-CM | POA: Diagnosis not present

## 2023-07-10 DIAGNOSIS — Z125 Encounter for screening for malignant neoplasm of prostate: Secondary | ICD-10-CM | POA: Diagnosis not present

## 2023-07-10 DIAGNOSIS — M109 Gout, unspecified: Secondary | ICD-10-CM | POA: Diagnosis not present

## 2023-07-17 DIAGNOSIS — I4891 Unspecified atrial fibrillation: Secondary | ICD-10-CM | POA: Diagnosis not present

## 2023-07-17 DIAGNOSIS — I10 Essential (primary) hypertension: Secondary | ICD-10-CM | POA: Diagnosis not present

## 2023-07-17 DIAGNOSIS — Z0000 Encounter for general adult medical examination without abnormal findings: Secondary | ICD-10-CM | POA: Diagnosis not present

## 2023-07-17 DIAGNOSIS — E119 Type 2 diabetes mellitus without complications: Secondary | ICD-10-CM | POA: Diagnosis not present

## 2023-07-17 DIAGNOSIS — M109 Gout, unspecified: Secondary | ICD-10-CM | POA: Diagnosis not present

## 2023-07-17 DIAGNOSIS — D649 Anemia, unspecified: Secondary | ICD-10-CM | POA: Diagnosis not present

## 2023-07-17 DIAGNOSIS — E7849 Other hyperlipidemia: Secondary | ICD-10-CM | POA: Diagnosis not present

## 2023-07-17 DIAGNOSIS — Z1331 Encounter for screening for depression: Secondary | ICD-10-CM | POA: Diagnosis not present

## 2023-07-17 DIAGNOSIS — M1990 Unspecified osteoarthritis, unspecified site: Secondary | ICD-10-CM | POA: Diagnosis not present

## 2023-07-24 ENCOUNTER — Telehealth: Payer: Self-pay

## 2023-07-24 NOTE — Telephone Encounter (Signed)
-----   Message from Zimmerman Riddle sent at 07/22/2023  2:46 PM EDT ----- Regarding: PCP labwork This patient was due for updated lab work to ensure correct dosing of his xarelto , obtained at [facility] with his PCP appt.  I reviewed his labwork, ok to continue 20mg  xarelto  daily.  Could you call him to update?   Thanks, Suzann

## 2023-07-24 NOTE — Telephone Encounter (Signed)
 Called patient, LVM advising to continue medication. Left call back number for any questions or concerns.

## 2023-07-30 DIAGNOSIS — M9905 Segmental and somatic dysfunction of pelvic region: Secondary | ICD-10-CM | POA: Diagnosis not present

## 2023-07-30 DIAGNOSIS — M5416 Radiculopathy, lumbar region: Secondary | ICD-10-CM | POA: Diagnosis not present

## 2023-07-30 DIAGNOSIS — M5431 Sciatica, right side: Secondary | ICD-10-CM | POA: Diagnosis not present

## 2023-07-30 DIAGNOSIS — M9903 Segmental and somatic dysfunction of lumbar region: Secondary | ICD-10-CM | POA: Diagnosis not present

## 2023-09-17 DIAGNOSIS — M180 Bilateral primary osteoarthritis of first carpometacarpal joints: Secondary | ICD-10-CM | POA: Diagnosis not present

## 2023-09-17 DIAGNOSIS — D1721 Benign lipomatous neoplasm of skin and subcutaneous tissue of right arm: Secondary | ICD-10-CM | POA: Diagnosis not present

## 2023-09-17 DIAGNOSIS — M9903 Segmental and somatic dysfunction of lumbar region: Secondary | ICD-10-CM | POA: Diagnosis not present

## 2023-09-17 DIAGNOSIS — M9905 Segmental and somatic dysfunction of pelvic region: Secondary | ICD-10-CM | POA: Diagnosis not present

## 2023-09-17 DIAGNOSIS — M5416 Radiculopathy, lumbar region: Secondary | ICD-10-CM | POA: Diagnosis not present

## 2023-09-17 DIAGNOSIS — M5431 Sciatica, right side: Secondary | ICD-10-CM | POA: Diagnosis not present

## 2023-10-17 DIAGNOSIS — M1612 Unilateral primary osteoarthritis, left hip: Secondary | ICD-10-CM | POA: Diagnosis not present

## 2023-10-17 DIAGNOSIS — M1611 Unilateral primary osteoarthritis, right hip: Secondary | ICD-10-CM | POA: Diagnosis not present

## 2023-10-17 DIAGNOSIS — M160 Bilateral primary osteoarthritis of hip: Secondary | ICD-10-CM | POA: Diagnosis not present

## 2023-10-22 DIAGNOSIS — M5431 Sciatica, right side: Secondary | ICD-10-CM | POA: Diagnosis not present

## 2023-10-22 DIAGNOSIS — M5416 Radiculopathy, lumbar region: Secondary | ICD-10-CM | POA: Diagnosis not present

## 2023-10-22 DIAGNOSIS — M9903 Segmental and somatic dysfunction of lumbar region: Secondary | ICD-10-CM | POA: Diagnosis not present

## 2023-10-22 DIAGNOSIS — M9905 Segmental and somatic dysfunction of pelvic region: Secondary | ICD-10-CM | POA: Diagnosis not present

## 2023-10-29 DIAGNOSIS — M25551 Pain in right hip: Secondary | ICD-10-CM | POA: Diagnosis not present

## 2023-10-29 DIAGNOSIS — M25552 Pain in left hip: Secondary | ICD-10-CM | POA: Diagnosis not present

## 2023-11-04 ENCOUNTER — Ambulatory Visit: Payer: PPO | Admitting: Dermatology

## 2023-11-04 ENCOUNTER — Encounter: Payer: Self-pay | Admitting: Dermatology

## 2023-11-04 DIAGNOSIS — L578 Other skin changes due to chronic exposure to nonionizing radiation: Secondary | ICD-10-CM | POA: Diagnosis not present

## 2023-11-04 DIAGNOSIS — Z8589 Personal history of malignant neoplasm of other organs and systems: Secondary | ICD-10-CM

## 2023-11-04 DIAGNOSIS — Z1283 Encounter for screening for malignant neoplasm of skin: Secondary | ICD-10-CM | POA: Diagnosis not present

## 2023-11-04 DIAGNOSIS — D1801 Hemangioma of skin and subcutaneous tissue: Secondary | ICD-10-CM | POA: Diagnosis not present

## 2023-11-04 DIAGNOSIS — L821 Other seborrheic keratosis: Secondary | ICD-10-CM

## 2023-11-04 DIAGNOSIS — Z7189 Other specified counseling: Secondary | ICD-10-CM

## 2023-11-04 DIAGNOSIS — D229 Melanocytic nevi, unspecified: Secondary | ICD-10-CM

## 2023-11-04 DIAGNOSIS — L719 Rosacea, unspecified: Secondary | ICD-10-CM

## 2023-11-04 DIAGNOSIS — W908XXA Exposure to other nonionizing radiation, initial encounter: Secondary | ICD-10-CM | POA: Diagnosis not present

## 2023-11-04 DIAGNOSIS — Z79899 Other long term (current) drug therapy: Secondary | ICD-10-CM

## 2023-11-04 DIAGNOSIS — L219 Seborrheic dermatitis, unspecified: Secondary | ICD-10-CM

## 2023-11-04 DIAGNOSIS — S90112A Contusion of left great toe without damage to nail, initial encounter: Secondary | ICD-10-CM | POA: Diagnosis not present

## 2023-11-04 DIAGNOSIS — D692 Other nonthrombocytopenic purpura: Secondary | ICD-10-CM | POA: Diagnosis not present

## 2023-11-04 DIAGNOSIS — L814 Other melanin hyperpigmentation: Secondary | ICD-10-CM

## 2023-11-04 DIAGNOSIS — L570 Actinic keratosis: Secondary | ICD-10-CM | POA: Diagnosis not present

## 2023-11-04 MED ORDER — HYDROCORTISONE 2.5 % EX CREA: TOPICAL_CREAM | CUTANEOUS | 6 refills | Status: AC

## 2023-11-04 MED ORDER — KETOCONAZOLE 2 % EX CREA: TOPICAL_CREAM | CUTANEOUS | 6 refills | Status: AC

## 2023-11-04 NOTE — Progress Notes (Signed)
 Follow-Up Visit   Subjective  Benjamin [name] is a 73 y.o. male who presents for the following: Skin Cancer Screening and Full Body Skin Exam Hx of scc, hx of aks, hx of isks, hx of seborrheic dermatitis on face using ketoconazole  cream and hydrocortisone  2.5 cream   The patient presents for Total-Body Skin Exam (TBSE) for skin cancer screening and mole check. The patient has spots, moles and lesions to be evaluated, some may be new or changing and the patient may have concern these could be cancer.  The following portions of the chart were reviewed this encounter and updated as appropriate: medications, allergies, medical history  Review of Systems:  No other skin or systemic complaints except as noted in HPI or Assessment and Plan.  Objective  Well appearing patient in no apparent distress; mood and affect are within normal limits.  A full examination was performed including scalp, head, eyes, ears, nose, lips, neck, chest, axillae, abdomen, back, buttocks, bilateral upper extremities, bilateral lower extremities, hands, feet, fingers, toes, fingernails, and toenails. All findings within normal limits unless otherwise noted below.   Relevant physical exam findings are noted in the Assessment and Plan.  face and ears x 17 (17) Erythematous thin papules/macules with gritty scale.   Assessment & Plan   SKIN CANCER SCREENING PERFORMED TODAY.  ACTINIC DAMAGE - Chronic condition, secondary to cumulative UV/sun exposure - diffuse scaly erythematous macules with underlying dyspigmentation - Recommend daily broad spectrum sunscreen SPF 30+ to sun-exposed areas, reapply every 2 hours as needed.  - Staying in the shade or wearing long sleeves, sun glasses (UVA+UVB protection) and wide brim hats (4-inch brim around the entire circumference of the hat) are also recommended for sun protection.  - Call for new or changing lesions.  LENTIGINES, SEBORRHEIC KERATOSES, HEMANGIOMAS - Benign normal  skin lesions - Benign-appearing - Call for any changes  MELANOCYTIC NEVI - Tan-brown and/or pink-flesh-colored symmetric macules and papules - Benign appearing on exam today - Observation - Call clinic for new or changing moles - Recommend daily use of broad spectrum spf 30+ sunscreen to sun-exposed areas.   Purpura - Chronic; persistent and recurrent.  Treatable, but not curable. - Violaceous macules and patches - Benign - Related to trauma, age, sun damage and/or use of blood thinners, chronic use of topical and/or oral steroids - Observe - Can use OTC arnica containing moisturizer such as Dermend Bruise Formula if desired - Call for worsening or other concerns  Subungual hematoma at left great toe  Exam: blood under toenail.  Proximal clearing Treatment Plan: Benign-appearing.  Observation.  Call clinic for new or changing lesions.  Recommend daily use of broad spectrum spf 30+ sunscreen to sun-exposed areas.    SEBORRHEIC DERMATITIS Exam: Pink patches with greasy scale at face Chronic and persistent condition with duration or expected duration over one year. Condition is symptomatic / bothersome to patient. Not to goal. Seborrheic Dermatitis is a chronic persistent rash characterized by pinkness and scaling most commonly of the mid face but also can occur on the scalp (dandruff), ears; mid chest, mid back and groin.  It tends to be exacerbated by stress and cooler weather.  People who have neurologic disease may experience new onset or exacerbation of existing seborrheic dermatitis.  The condition is not curable but treatable and can be controlled. Treatment Plan: Continue Ketoconazole  cream  mwf qhs at bedtime Continue hct 2.5 cream on twth at bedtime   ROSACEA Exam mid face erythema with telangiectasias  at cheeks and nose  Chronic and persistent condition with duration or expected duration over one year. Condition is symptomatic/ bothersome to patient. Not currently at  goal. Rosacea is a chronic progressive skin condition usually affecting the face of adults, causing redness and/or acne bumps. It is treatable but not curable. It sometimes affects the eyes (ocular rosacea) as well. It may respond to topical and/or systemic medication and can flare with stress, sun exposure, alcohol, exercise, topical steroids (including hydrocortisone /cortisone 10) and some foods.  Daily application of broad spectrum spf 30+ sunscreen to face is recommended to reduce flares.  Patient denies grittiness of the eyes   Treatment Plan Counseling for BBL / IPL / Laser and Coordination of Care Discussed the treatment option of Broad Band Light (BBL) /Intense Pulsed Light (IPL)/ Laser for skin discoloration, including brown spots and redness.  Typically we recommend at least 1-3 treatment sessions about 5-8 weeks apart for best results.  Cannot have tanned skin when BBL performed, and regular use of sunscreen/photoprotection is advised after the procedure to help maintain results. The patient's condition may also require maintenance treatments in the future.  The fee for BBL / laser treatments is $350 per treatment session for the whole face.  A fee can be quoted for other parts of the body.  Insurance typically does not pay for BBL/laser treatments and therefore the fee is an out-of-pocket cost. Recommend prophylactic valtrex treatment. Once scheduled for procedure, will send Rx in prior to patient's appointment.    HISTORY OF SQUAMOUS CELL CARCINOMA IN SITU OF THE SKIN 10/31/2016 right lat neck posterior aspect  - No evidence of recurrence today - No lymphadenopathy - Recommend regular full body skin exams - Recommend daily broad spectrum sunscreen SPF 30+ to sun-exposed areas, reapply every 2 hours as needed.  - Call if any new or changing lesions are noted between office visits  SEBORRHEIC DERMATITIS   Related Medications hydrocortisone  2.5 % cream Apply to face on Tuesday,  Thursday, and Saturday QD. For seborrheic dermatitis ketoconazole  (NIZORAL ) 2 % cream Apply topically to face nightly on m-w-f for seborrheic dermatitis ACTINIC KERATOSIS (17) face and ears x 17 (17) Actinic keratoses are precancerous spots that appear secondary to cumulative UV radiation exposure/sun exposure over time. They are chronic with expected duration over 1 year. A portion of actinic keratoses will progress to squamous cell carcinoma of the skin. It is not possible to reliably predict which spots will progress to skin cancer and so treatment is recommended to prevent development of skin cancer.  Recommend daily broad spectrum sunscreen SPF 30+ to sun-exposed areas, reapply every 2 hours as needed.  Recommend staying in the shade or wearing long sleeves, sun glasses (UVA+UVB protection) and wide brim hats (4-inch brim around the entire circumference of the hat). Call for new or changing lesions. Destruction of lesion - face and ears x 17 (17) Complexity: simple   Destruction method: cryotherapy   Informed consent: discussed and consent obtained   Timeout:  patient name, date of birth, surgical site, and procedure verified Lesion destroyed using liquid nitrogen: Yes   Region frozen until ice ball extended beyond lesion: Yes   Outcome: patient tolerated procedure well with no complications   Post-procedure details: wound care instructions given    Return in about 1 year (around 11/03/2024) for TBSE.  LILLETTE Eleanor Blush, CMA, am acting as scribe for Alm Rhyme, MD.  Documentation: I have reviewed the above documentation for accuracy and completeness, and I agree  with the above.  Alm Rhyme, MD

## 2023-11-04 NOTE — Patient Instructions (Addendum)
 Actinic keratoses are precancerous spots that appear secondary to cumulative UV radiation exposure/sun exposure over time. They are chronic with expected duration over 1 year. A portion of actinic keratoses will progress to squamous cell carcinoma of the skin. It is not possible to reliably predict which spots will progress to skin cancer and so treatment is recommended to prevent development of skin cancer.  Recommend daily broad spectrum sunscreen SPF 30+ to sun-exposed areas, reapply every 2 hours as needed.  Recommend staying in the shade or wearing long sleeves, sun glasses (UVA+UVB protection) and wide brim hats (4-inch brim around the entire circumference of the hat). Call for new or changing lesions.   Cryotherapy Aftercare  Wash gently with soap and water everyday.   Apply Vaseline and Band-Aid daily until healed.     Melanoma ABCDEs  Melanoma is the most dangerous type of skin cancer, and is the leading cause of death from skin disease.  You are more likely to develop melanoma if you: Have light-colored skin, light-colored eyes, or red or blond hair Spend a lot of time in the sun Tan regularly, either outdoors or in a tanning bed Have had blistering sunburns, especially during childhood Have a close family member who has had a melanoma Have atypical moles or large birthmarks  Early detection of melanoma is key since treatment is typically straightforward and cure rates are extremely high if we catch it early.   The first sign of melanoma is often a change in a mole or a new dark spot.  The ABCDE system is a way of remembering the signs of melanoma.  A for asymmetry:  The two halves do not match. B for border:  The edges of the growth are irregular. C for color:  A mixture of colors are present instead of an even brown color. D for diameter:  Melanomas are usually (but not always) greater than 6mm - the size of a pencil eraser. E for evolution:  The spot keeps changing in  size, shape, and color.  Please check your skin once per month between visits. You can use a small mirror in front and a large mirror behind you to keep an eye on the back side or your body.   If you see any new or changing lesions before your next follow-up, please call to schedule a visit.  Please continue daily skin protection including broad spectrum sunscreen SPF 30+ to sun-exposed areas, reapplying every 2 hours as needed when you're outdoors.   Staying in the shade or wearing long sleeves, sun glasses (UVA+UVB protection) and wide brim hats (4-inch brim around the entire circumference of the hat) are also recommended for sun protection.    Due to recent changes in healthcare laws, you may see results of your pathology and/or laboratory studies on MyChart before the doctors have had a chance to review them. We understand that in some cases there may be results that are confusing or concerning to you. Please understand that not all results are received at the same time and often the doctors may need to interpret multiple results in order to provide you with the best plan of care or course of treatment. Therefore, we ask that you please give us  2 business days to thoroughly review all your results before contacting the office for clarification. Should we see a critical lab result, you will be contacted sooner.   If You Need Anything After Your Visit  If you have any questions or concerns for your  doctor, please call our main line at [phone] and press option 4 to reach your doctor's medical assistant. If no one answers, please leave a voicemail as directed and we will return your call as soon as possible. Messages left after 4 pm will be answered the following business day.   You may also send us  a message via MyChart. We typically respond to MyChart messages within 1-2 business days.  For prescription refills, please ask your pharmacy to contact our office. Our fax number is  [phone].  If you have an urgent issue when the clinic is closed that cannot wait until the next business day, you can page your doctor at the number below.    Please note that while we do our best to be available for urgent issues outside of office hours, we are not available 24/7.   If you have an urgent issue and are unable to reach us , you may choose to seek medical care at your doctor's office, retail clinic, urgent care center, or emergency room.  If you have a medical emergency, please immediately call 911 or go to the emergency department.  Pager Numbers  - Dr. Hester: [phone]  - Dr. Jackquline: [phone]  - Dr. Claudene: [phone]   - Dr. Raymund: [phone]  In the event of inclement weather, please call our main line at [phone] for an update on the status of any delays or closures.  Dermatology Medication Tips: Please keep the boxes that topical medications come in in order to help keep track of the instructions about where and how to use these. Pharmacies typically print the medication instructions only on the boxes and not directly on the medication tubes.   If your medication is too expensive, please contact our office at [phone] option 4 or send us  a message through MyChart.   We are unable to tell what your co-pay for medications will be in advance as this is different depending on your insurance coverage. However, we may be able to find a substitute medication at lower cost or fill out paperwork to get insurance to cover a needed medication.   If a prior authorization is required to get your medication covered by your insurance company, please allow us  1-2 business days to complete this process.  Drug prices often vary depending on where the prescription is filled and some pharmacies may offer cheaper prices.  The website www.goodrx.com contains coupons for medications through different pharmacies. The prices here do not account for what the cost  may be with help from insurance (it may be cheaper with your insurance), but the website can give you the price if you did not use any insurance.  - You can print the associated coupon and take it with your prescription to the pharmacy.  - You may also stop by our office during regular business hours and pick up a GoodRx coupon card.  - If you need your prescription sent electronically to a different pharmacy, notify our office through [facility] or by phone at [phone] option 4.     Si Usted Necesita Algo Despus de Su Visita  Tambin puede enviarnos un mensaje a travs de Clinical cytogeneticist. Por lo general respondemos a los mensajes de MyChart en el transcurso de 1 a 2 das hbiles.  Para renovar recetas, por favor pida a su farmacia que se ponga en contacto con nuestra oficina. Randi lakes de fax es [place] [phone].  Si tiene un asunto urgente cuando la clnica est cerrada y que  no puede esperar hasta el siguiente da hbil, puede llamar/localizar a su doctor(a) al nmero que aparece a continuacin.   Por favor, tenga en cuenta que aunque hacemos todo lo posible para estar disponibles para asuntos urgentes fuera del horario de [place], no estamos disponibles las 24 horas del da, los 7 [address]  [address] de la [place].   Si tiene un problema urgente y no puede comunicarse con nosotros, puede optar por buscar atencin mdica  en el consultorio de su doctor(a), en una clnica privada, en un centro de atencin urgente o en una sala de emergencias.  Si tiene Engineer, drilling, por favor llame inmediatamente al 911 o vaya a la sala de emergencias.  Nmeros de bper  - Dr. Hester: [phone]  - Dra. Jackquline: [phone]  - Dr. Claudene: [phone]  - Dra. Kitts: [phone]  En caso de inclemencias del [place], por favor llame a nuestra lnea principal al [phone] para una actualizacin sobre el estado de cualquier retraso o cierre.  Consejos para la medicacin en dermatologa: Por  favor, guarde las cajas en las que vienen los medicamentos de uso tpico para ayudarle a seguir las instrucciones sobre dnde y cmo usarlos. Las farmacias generalmente imprimen las instrucciones del medicamento slo en las cajas y no directamente en los tubos del [place].   Si su medicamento es muy caro, por favor, pngase en contacto con landry rieger llamando al [phone] y presione la opcin 4 o envenos un mensaje a travs de Clinical cytogeneticist.   No podemos decirle cul ser su copago por los medicamentos por adelantado ya que esto es diferente dependiendo de la cobertura de su seguro. Sin embargo, es posible que podamos encontrar un medicamento sustituto a Audiological scientist un formulario para que el seguro cubra el medicamento que se considera necesario.   Si se requiere una autorizacin previa para que su compaa de seguros malta su medicamento, por favor permtanos de 1 a 2 das hbiles para completar este proceso.  Los precios de los medicamentos varan con frecuencia dependiendo del Environmental consultant de dnde se surte la receta y alguna farmacias pueden ofrecer precios ms baratos.  El sitio web www.goodrx.com tiene cupones para medicamentos de Health and safety inspector. Los precios aqu no tienen en cuenta lo que podra costar con la ayuda del seguro (puede ser ms barato con su seguro), pero el sitio web puede darle el precio si no utiliz Tourist information centre manager.  - Puede imprimir el cupn correspondiente y llevarlo con su receta a la farmacia.  - Tambin puede pasar por nuestra oficina durante el horario de atencin regular y Education officer, museum una tarjeta de cupones de GoodRx.  - Si necesita que su receta se enve electrnicamente a una farmacia diferente, informe a nuestra oficina a travs de MyChart de [place] o por telfono llamando al [phone] y presione la opcin 4.

## 2023-11-10 DIAGNOSIS — I10 Essential (primary) hypertension: Secondary | ICD-10-CM | POA: Diagnosis not present

## 2023-11-10 DIAGNOSIS — I4891 Unspecified atrial fibrillation: Secondary | ICD-10-CM | POA: Diagnosis not present

## 2023-11-10 DIAGNOSIS — M1990 Unspecified osteoarthritis, unspecified site: Secondary | ICD-10-CM | POA: Diagnosis not present

## 2023-11-10 DIAGNOSIS — E119 Type 2 diabetes mellitus without complications: Secondary | ICD-10-CM | POA: Diagnosis not present

## 2023-11-10 DIAGNOSIS — D649 Anemia, unspecified: Secondary | ICD-10-CM | POA: Diagnosis not present

## 2023-11-10 DIAGNOSIS — Z0000 Encounter for general adult medical examination without abnormal findings: Secondary | ICD-10-CM | POA: Diagnosis not present

## 2023-11-10 DIAGNOSIS — E7849 Other hyperlipidemia: Secondary | ICD-10-CM | POA: Diagnosis not present

## 2023-11-10 DIAGNOSIS — M109 Gout, unspecified: Secondary | ICD-10-CM | POA: Diagnosis not present

## 2023-11-17 DIAGNOSIS — D649 Anemia, unspecified: Secondary | ICD-10-CM | POA: Diagnosis not present

## 2023-11-17 DIAGNOSIS — I10 Essential (primary) hypertension: Secondary | ICD-10-CM | POA: Diagnosis not present

## 2023-11-17 DIAGNOSIS — E7849 Other hyperlipidemia: Secondary | ICD-10-CM | POA: Diagnosis not present

## 2023-11-17 DIAGNOSIS — M109 Gout, unspecified: Secondary | ICD-10-CM | POA: Diagnosis not present

## 2023-11-17 DIAGNOSIS — Z2821 Immunization not carried out because of patient refusal: Secondary | ICD-10-CM | POA: Diagnosis not present

## 2023-11-17 DIAGNOSIS — Z23 Encounter for immunization: Secondary | ICD-10-CM | POA: Diagnosis not present

## 2023-11-17 DIAGNOSIS — I4891 Unspecified atrial fibrillation: Secondary | ICD-10-CM | POA: Diagnosis not present

## 2023-11-17 DIAGNOSIS — E119 Type 2 diabetes mellitus without complications: Secondary | ICD-10-CM | POA: Diagnosis not present

## 2023-11-28 DIAGNOSIS — D649 Anemia, unspecified: Secondary | ICD-10-CM | POA: Diagnosis not present

## 2023-11-28 DIAGNOSIS — Z1331 Encounter for screening for depression: Secondary | ICD-10-CM | POA: Diagnosis not present

## 2023-12-03 DIAGNOSIS — M9905 Segmental and somatic dysfunction of pelvic region: Secondary | ICD-10-CM | POA: Diagnosis not present

## 2023-12-03 DIAGNOSIS — M9903 Segmental and somatic dysfunction of lumbar region: Secondary | ICD-10-CM | POA: Diagnosis not present

## 2023-12-03 DIAGNOSIS — M5416 Radiculopathy, lumbar region: Secondary | ICD-10-CM | POA: Diagnosis not present

## 2023-12-03 DIAGNOSIS — M5431 Sciatica, right side: Secondary | ICD-10-CM | POA: Diagnosis not present

## 2023-12-23 ENCOUNTER — Telehealth: Payer: Self-pay | Admitting: Cardiology

## 2023-12-23 NOTE — Telephone Encounter (Signed)
 Per patients MyChart appointment request: ankle swelling and a few episodes of low heart rate and a-fib    STAT if HR is under 50 or over 120  (normal HR is 60-100 beats per minute)  What is your heart rate? My heart rate range is typically in the 42 to 70 range and on a few occasions has dropped to the upper 30's   Do you have a log of your heart rate readings (document readings)?   Do you have any other symptoms? No other symptoms   Pt c/o swelling: STAT is pt has developed SOB within 24 hours  How much weight have you gained and in what time span? My weight is in the range it usually is, minimum to little gain   If swelling, where is the swelling located? The swelling is located in both ankles and is minimal.   Are you currently taking a fluid pill? I am not taking any fluid pills   Are you currently SOB? have no shortness of breath   Do you have a log of your daily weights (if so, list)? I do not have a log of my weights  Have you gained 3 pounds in a day or 5 pounds in a week? I have not gained 3 pounds in a day nor 5 pounds in a week   Have you traveled recently? I have only traveled within [place]  recently.   Patient c/o Palpitations: STAT if patient c/o lightheadedness, shortness of breath, or chest pain  How long have you had palpitations/irregular HR/ Afib? Are you having the symptoms now? I do have a history of afib and have had an ablation and a cardioversion. I have only had a few instances of afib since these procedures and those have occurred within the last 6 weeks or so. There have not been may instances and they have been short lived.   Are you currently experiencing lightheadedness, SOB or CP? I have not had any shortness of breath or chest pain.   Do you have a history of afib (atrial fibrillation) or irregular heart rhythm?   Have you checked your BP or HR? (document readings if available): I don't check my blood pressure regularly but when I do it  is in the normal range   Are you experiencing any other symptoms? I have no other symptoms

## 2023-12-23 NOTE — Telephone Encounter (Signed)
 Spoke with pt regarding his heart rate, swelling and palpitations. Pt stated his heart rate has dropped down to upper 30s for seconds at a time. The pt denied any symptoms with this lower heart rate. Pt stated he has a kardia mobile that has been alerting him of his heart rate. The device has also alerted that the pt is in afib a few times in the last 6 weeks. There were a couple of instances where the pt was in afib for a day or two. Pt again denied any symptoms. Pt is taking Xarelto  and has not missed any doses. The pt also stated that his ankles are swollen but not very swollen. Pt stated the swelling goes down over night and gets worse throughout the day. The patient denied any shortness of breath or chest pain. Pt stated he does not check his blood pressure often but when he does it's roughly 120s/70s. Pt was given ED precautions and scheduled to see Thom Sluder, PA on 11/25. Pt verbalized understanding. All questions if any were answered.

## 2024-01-01 NOTE — Progress Notes (Addendum)
 Cardiology Office Note:  .   Date:  01/06/2024  ID:  Benjamin [name], DOB 1951/01/09, MRN [id number] PCP: Benjamin Alm SQUIBB, MD  [facility] Providers Cardiologist:  Benjamin Rakers, MD (Inactive) Electrophysiologist:  Benjamin ONEIDA HOLTS, MD {  History of Present Illness: Benjamin   [name] is a 73 y.o. male with history of paroxysmal atrial flutter/fibrillation status post AF/SVT ablation 07/2022, coronary artery calcifications, hypertension, type 2 diabetes.     EP 07/2022 AF/SVT ablation with isolation of pulmonary veins, posterior wall and slow pathway's with inducible AVNRT.  Had new flutter after ablation 11/2022 successful DCCV for atrial flutter.  CAD 06/2022 CAC score 1299.  Ascending aortic dilatation 3.9 cm.  Social history  Former smoker, stopped 50+ years ago Regularly drinks, no drugs. Moderately active 3 to 4 days and goes to the fitness center.     Patient with history of PAF/AFL status post ablation 07/2022.  Has had excellent results without further episodes noted after procedure.  Last seen 06/2023 doing well from a cardiac standpoint without recurrences.  Patient recently contacted our office reporting asymptomatic bradycardia with heart rates in the 30s.  He does have a Kardia mobile device and had device alerts notifying him of this.  Intermittent episodes of atrial fibrillation.  Compliant with Xarelto  without any missed doses, mild ankle edema.  No shortness of breath or chest pain.  Today patient presents for follow-up after getting abnormal device alerts.  He is in atrial flutter today with a heart rate of 70, completely asymptomatic.  He only knows about these events because his Apple watch and Kardia mobile device will make him aware of these events.  Looking at his timeline and iPhone alerts looks like he has had paroxysmal episodes of atrial fibrillation/flutter since about September.  Initially had some mild ankle edema but after about 1 month this  resolved.  He has not missed any doses of his Xarelto .  He is without any exertional complaints and feels well otherwise.  ROS: Denies: Chest pain, shortness of breath, orthopnea, peripheral edema, palpitations, decreased exercise intolerance, fatigue, lightheadedness.   Studies Reviewed: Benjamin    EKG Interpretation Date/Time:  Tuesday January 06 2024 07:48:00 EST Ventricular Rate:  70 PR Interval:    QRS Duration:  86 QT Interval:  410 QTC Calculation: 442 R Axis:   64  Text Interpretation: Atrial flutter When compared with ECG of 03-Jul-2023 13:05, Atrial flutter has replaced Sinus rhythm T wave inversion no longer evident in Inferior leads Confirmed by Benjamin [name] [id number]) on 01/06/2024 7:50:54 AM    Risk Assessment/Calculations:    CHA2DS2-VASc Score = 4  {This indicates a 4.8% annual risk of stroke. The patient's score is based upon: CHF History: 0 HTN History: 1 Diabetes History: 1 Stroke History: 0 Vascular Disease History: 1 Age Score: 1 Gender Score: 0           Physical Exam:   VS:  BP 110/68   Pulse 69   Ht 5' 9 (1.753 m)   Wt 166 lb 6.4 oz (75.5 kg)   SpO2 98%   BMI 24.57 kg/m    Wt Readings from Last 3 Encounters:  01/06/24 166 lb 6.4 oz (75.5 kg)  07/06/23 166 lb 10.7 oz (75.6 kg)  07/03/23 166 lb 9.6 oz (75.6 kg)    GEN: Well nourished, well developed in no acute distress NECK: No JVD; No carotid bruits CARDIAC: RRR, 2/6 murmur  RESPIRATORY:  Clear to auscultation without rales,  wheezing or rhonchi  ABDOMEN: Soft, non-tender, non-distended EXTREMITIES:  No edema; No deformity   ASSESSMENT AND PLAN: .    PAF/AFL SVT -07/2022 ablation with isolation of pulmonary veins, posterior wall and slow pathway's with inducible AVNRT.  -11/2022 DCCV Today patient is back in atrial flutter with a heart rate of 70.  Appears to be typical flutter with 3-1 conduction.  Completely asymptomatic.  Looks euvolemic. Continue Xarelto  20 mg daily.  Has not missed any  doses. Will plan for 1 week heart monitor to assess atrial fibrillation burden.  I suspect that he is paroxysmal so therefore antiarrhythmic therapy versus ablation may be indicated rather than DCCV. Evaluating for secondary etiologies, check TSH, get echocardiogram, getting iron panel for anemia.  Interestingly his paroxysmal episodes somewhat correlate with his recent diagnosis of mild anemia. Of note he has Software Engineer mobile device Recent lab work by PCP 1 month ago reviewed without any significant abnormalities. Also prescribing Lopressor  25 mg as needed up to every 6 for symptoms of palpitations or heart rates greater than 120  Coronary artery calcifications -06/2022 CAC score 1299. Completely asymptomatic at this time.  No chest pain. No aspirin with DOAC, continue with rosuvastatin  20 mg.  LDL is well-controlled at 51 06/2023.  Hypertension Blood pressure 110/68.  Continue with losartan  25 mg.  Type 2 diabetes A1c 7.2% 10/2023.  Continue Jardiance  25 mg.  Alcohol use May benefit if he decreases intake.  Educated patient.  Dispo: 3-week follow-up with A-fib clinic to discuss heart monitor results and consideration of antiarrhythmic/ablation.  Signed, Benjamin LITTIE Sluder, PA-C

## 2024-01-06 ENCOUNTER — Ambulatory Visit (INDEPENDENT_AMBULATORY_CARE_PROVIDER_SITE_OTHER)

## 2024-01-06 ENCOUNTER — Ambulatory Visit: Attending: Cardiology | Admitting: Cardiology

## 2024-01-06 ENCOUNTER — Encounter: Payer: Self-pay | Admitting: Cardiology

## 2024-01-06 VITALS — BP 110/68 | HR 69 | Ht 69.0 in | Wt 166.4 lb

## 2024-01-06 DIAGNOSIS — D649 Anemia, unspecified: Secondary | ICD-10-CM | POA: Diagnosis not present

## 2024-01-06 DIAGNOSIS — I10 Essential (primary) hypertension: Secondary | ICD-10-CM

## 2024-01-06 DIAGNOSIS — I483 Typical atrial flutter: Secondary | ICD-10-CM

## 2024-01-06 DIAGNOSIS — I471 Supraventricular tachycardia, unspecified: Secondary | ICD-10-CM | POA: Diagnosis not present

## 2024-01-06 DIAGNOSIS — I2510 Atherosclerotic heart disease of native coronary artery without angina pectoris: Secondary | ICD-10-CM | POA: Diagnosis not present

## 2024-01-06 DIAGNOSIS — I480 Paroxysmal atrial fibrillation: Secondary | ICD-10-CM | POA: Diagnosis not present

## 2024-01-06 DIAGNOSIS — R001 Bradycardia, unspecified: Secondary | ICD-10-CM

## 2024-01-06 MED ORDER — METOPROLOL TARTRATE 25 MG PO TABS: ORAL_TABLET | ORAL | 3 refills | Status: AC

## 2024-01-06 NOTE — Progress Notes (Unsigned)
 Applied a 7 day Zio XT monitor to patient in the office  Cindie to read

## 2024-01-06 NOTE — Patient Instructions (Signed)
 Medication Instructions:  Start Lopressor  25 mg every 6 hours as needed if heart rate is over 120.  *If you need a refill on your cardiac medications before your next appointment, please call your pharmacy*  Lab Work: Today:TSH, Iron Panel  If you have labs (blood work) drawn today and your tests are completely normal, you will receive your results only by: MyChart Message (if you have MyChart) OR A paper copy in the mail If you have any lab test that is abnormal or we need to change your treatment, we will call you to review the results.  Testing/Procedures: Zio  ZIO XT- Long Term Monitor Instructions  Your physician has requested you wear a ZIO patch monitor for __7__ days.   This is a single patch monitor. [employer] supplies one patch monitor per enrollment. Additional  stickers are not available. Please do not apply patch if you will be having a Nuclear Stress Test,  Echocardiogram, Cardiac CT, MRI, or Chest Xray during the period you would be wearing the  monitor. The patch cannot be worn during these tests. You cannot remove and re-apply the  ZIO XT patch monitor.   Your ZIO patch monitor will be mailed 3 day USPS to your address on file. It may take 3-5 days  to receive your monitor after you have been enrolled.   Once you have received your monitor, please review the enclosed instructions. Your monitor  has already been registered assigning a specific monitor serial # to you.     Billing and Patient Assistance Program Information  We have supplied [employer] with any of your insurance information on file for billing purposes.  [employer] offers a sliding scale Patient Assistance Program for patients that do not have  insurance, or whose insurance does not completely cover the cost of the ZIO monitor.  You must apply for the Patient Assistance Program to qualify for this discounted rate.   To apply, please call [employer] at [phone], select option 4, select option 2, ask to apply  for  Patient Assistance Program. Meredeth will ask your household income, and how many people  are in your household. They will quote your out-of-pocket cost based on that information.  [employer] will also be able to set up a 37-month, interest-free payment plan if needed.     Applying the monitor  Shave hair from upper left chest.  Hold abrader disc by orange tab. Rub abrader in 40 strokes over the upper left chest as  indicated in your monitor instructions.  Clean area with 4 enclosed alcohol pads. Let dry.  Apply patch as indicated in monitor instructions. Patch will be placed under collarbone on left  side of chest with arrow pointing upward.  Rub patch adhesive wings for 2 minutes. Remove white label marked 1. Remove the white  label marked 2. Rub patch adhesive wings for 2 additional minutes.  While looking in a mirror, press and release button in center of patch. A small green light will  flash 3-4 times. This will be your only indicator that the monitor has been turned on.  Do not shower for the first 24 hours. You may shower after the first 24 hours.  Press the button if you feel a symptom. You will hear a small click. Record Date, Time and  Symptom in the Patient Logbook.  When you are ready to remove the patch, follow instructions on the last 2 pages of Patient  Logbook. Stick patch monitor onto the last page of Patient  Logbook.   Place Patient Logbook in the blue and white box. Use locking tab on box and tape box closed  securely. The blue and white box has prepaid postage on it. Please place it in the mailbox as  soon as possible. Your physician should have your test results approximately 7 days after the  monitor has been mailed back to [facility].   Call [facility] Customer Care at [phone] if you have questions regarding  your ZIO XT patch monitor. Call them immediately if you see an orange light blinking on your  monitor.   If your monitor falls off in  less than 4 days, contact our Monitor department at [phone].   If your monitor becomes loose or falls off after 4 days call [employer] at [phone] for  suggestions on securing your monitor.    Echocardiogram Complete Your physician has requested that you have an echocardiogram. Echocardiography is a painless test that uses sound waves to create images of your heart. It provides your doctor with information about the size and shape of your heart and how well your heart's chambers and valves are working. This procedure takes approximately one hour. There are no restrictions for this procedure. Please do NOT wear cologne, perfume, aftershave, or lotions (deodorant is allowed). Please arrive 15 minutes prior to your appointment time.  Please note: We ask at that you not bring children with you during ultrasound (echo/ vascular) testing. Due to room size and safety concerns, children are not allowed in the ultrasound rooms during exams. Our front office staff cannot provide observation of children in our lobby area while testing is being conducted. An adult accompanying a patient to their appointment will only be allowed in the ultrasound room at the discretion of the ultrasound technician under special circumstances. We apologize for any inconvenience.   Follow-Up: At [facility], you and your health needs are our priority.  As part of our continuing mission to provide you with exceptional heart care, our providers are all part of one team.  This team includes your primary Cardiologist (physician) and Advanced Practice Providers or APPs (Physician Assistants and Nurse Practitioners) who all work together to provide you with the care you need, when you need it.  Your next appointment:   3 week(s)  Provider:   You will follow up in the Atrial Fibrillation Clinic located at [facility]. Your provider will be: Clint R. Fenton, PA-C    We recommend signing up for the patient  portal called MyChart.  Sign up information is provided on this After Visit Summary.  MyChart is used to connect with patients for Virtual Visits (Telemedicine).  Patients are able to view lab/test results, encounter notes, upcoming appointments, etc.  Non-urgent messages can be sent to your provider as well.   To learn more about what you can do with MyChart, go to [email or web address].   Other Instructions Please decrease alcohol intake as it increases your heart rate.

## 2024-01-07 ENCOUNTER — Ambulatory Visit: Payer: Self-pay | Admitting: Cardiology

## 2024-01-07 LAB — IRON,TIBC AND FERRITIN PANEL
Ferritin: 104 ng/mL (ref 30–400)
Iron Saturation: 22 % (ref 15–55)
Iron: 88 ug/dL (ref 38–169)
Total Iron Binding Capacity: 395 ug/dL (ref 250–450)
UIBC: 307 ug/dL (ref 111–343)

## 2024-01-07 LAB — TSH: TSH: 1.95 u[IU]/mL (ref 0.450–4.500)

## 2024-01-19 DIAGNOSIS — I483 Typical atrial flutter: Secondary | ICD-10-CM | POA: Diagnosis not present

## 2024-01-27 ENCOUNTER — Ambulatory Visit (HOSPITAL_COMMUNITY)
Admission: RE | Admit: 2024-01-27 | Discharge: 2024-01-27 | Disposition: A | Source: Ambulatory Visit | Attending: Physician Assistant | Admitting: Physician Assistant

## 2024-01-27 VITALS — BP 140/62 | HR 69 | Ht 69.0 in | Wt 171.2 lb

## 2024-01-27 DIAGNOSIS — I483 Typical atrial flutter: Secondary | ICD-10-CM

## 2024-01-27 DIAGNOSIS — I4891 Unspecified atrial fibrillation: Secondary | ICD-10-CM

## 2024-01-27 DIAGNOSIS — D6869 Other thrombophilia: Secondary | ICD-10-CM

## 2024-01-27 DIAGNOSIS — I480 Paroxysmal atrial fibrillation: Secondary | ICD-10-CM

## 2024-01-27 NOTE — Patient Instructions (Signed)
°   Hold below medications 72 hours prior to scheduled procedure/anesthesia. Restart medication on the following day after scheduled procedure/anesthesia  Empagliflozin  (Jardiance ) -- Hold as of 12/17       Cardioversion scheduled qnm:Fnwijb December 22nd   - Arrive at the [facility] ([address]  and check in with ADMITTING at 8:30 AM   - Do not eat or drink anything after midnight the night prior to your procedure.   - Take all your morning medication (except diabetic medications) with a sip of water prior to arrival.  - Do NOT miss any doses of your blood thinner - if you should miss a dose or take a dose more than 4 hours late -- please notify our office immediately.  - You will not be able to drive home after your procedure. Please ensure you have a responsible adult to drive you home. You will need someone with you for 24 hours post procedure.     - Expect to be in the procedural area approximately 2 hours.   - If you feel as if you go back into normal rhythm prior to scheduled cardioversion, please notify our office immediately.   If your procedure is canceled in the cardioversion suite you will be charged a cancellation fee.   Follow up with Lesley EP MD -- their office will call you

## 2024-01-27 NOTE — Progress Notes (Signed)
 Primary Care Physician: Epifanio Alm SQUIBB, MD Primary Cardiologist: Lynwood Rakers, MD (Inactive) Electrophysiologist: OLE ONEIDA HOLTS, MD  Referring Physician: Dr Rakers Ozell JAYSON Benjamin is a 73 y.o. [name] with a history of DM, HLD, HTN, CAD, SVT, atrial flutter, atrial fibrillation who presents for follow up in the [facility]. Patient is on Xarelto  for stroke prevention. He was seen by Dr Holts 03/22/22 and was having more frequent episodes of afib. He underwent afib and SVT ablation on 07/15/22.  Patient returns for follow up for atrial fibrillation and atrial flutter. He was found to be in atrial flutter at his visit on 01/06/24. A cardiac monitor was ordered which showed 100% flutter burden. He remains in rate controlled atrial flutter today. He does have some mild SOB with exertion and pedal edema but otherwise is unaware of his arrhythmia. No bleeding issues on anticoagulation.   Today, he  denies symptoms of palpitations, chest pain, orthopnea, PND, dizziness, presyncope, syncope, snoring, daytime somnolence, bleeding, or neurologic sequela. The patient is tolerating medications without difficulties and is otherwise without complaint today.    Atrial Fibrillation Risk Factors:  he does not have symptoms or diagnosis of sleep apnea. he does not have a history of rheumatic fever.   Atrial Fibrillation Management history:  Previous antiarrhythmic drugs: none Previous cardioversions: none Previous ablations: 07/15/22 Anticoagulation history: Xarelto    ROS- All systems are reviewed and negative except as per the HPI above.   Physical Exam: BP (!) 140/62   Pulse 69   Ht 5' 9 (1.753 m)   Wt 77.7 kg   BMI 25.28 kg/m   GEN: Well nourished, well developed in no acute distress CARDIAC: Regular rate and rhythm, no murmurs, rubs, gallops RESPIRATORY:  Clear to auscultation without rales, wheezing or rhonchi  ABDOMEN: Soft, non-tender,  non-distended EXTREMITIES:  No edema; No deformity   Wt Readings from Last 3 Encounters:  01/27/24 77.7 kg  01/06/24 75.5 kg  07/06/23 75.6 kg     EKG Interpretation Date/Time:  Tuesday January 27 2024 15:04:10 EST Ventricular Rate:  69 PR Interval:    QRS Duration:  84 QT Interval:  416 QTC Calculation: 445 R Axis:   70  Text Interpretation: Atrial flutter with 4:1 A-V conduction Nonspecific ST and T wave abnormality Abnormal ECG When compared with ECG of 06-Jan-2024 07:48, No significant change was found Confirmed by Odena Mcquaid ([id number]) on 01/27/2024 3:14:51 PM   Echo 03/06/16 demonstrated  - Left ventricle: The cavity size was normal. Wall thickness was    normal. Systolic function was normal. The estimated ejection    fraction was in the range of 60% to 65%. Wall motion was normal;    there were no regional wall motion abnormalities. Doppler    parameters are consistent with abnormal left ventricular    relaxation (grade 1 diastolic dysfunction).  - Aortic valve: There was no stenosis.  - Aorta: Ascending aortic diameter: 38 mm (S).  - Ascending aorta: The ascending aorta was mildly dilated.  - Mitral valve: There was trivial regurgitation.  - Right ventricle: The cavity size was normal. Systolic function    was normal.  - Tricuspid valve: Peak RV-RA gradient (S): 18 mm Hg.  - Pulmonary arteries: PA peak pressure: 23 mm Hg (S).  - Inferior vena cava: The vessel was normal in size. The    respirophasic diameter changes were in the normal range (>= 50%),    consistent with normal central venous  pressure.   Impressions:   - Normal LV size with EF 60-65%. Normal RV size and systolic    function. No significant valvular abnormalities.    CHA2DS2-VASc Score = 4  The patient's score is based upon: CHF History: 0 HTN History: 1 Diabetes History: 1 Stroke History: 0 Vascular Disease History: 1 Age Score: 1 Gender Score: 0       ASSESSMENT AND PLAN: Persistent  Atrial Fibrillation/atrial flutter (ICD10:  I48.19) The patient's CHA2DS2-VASc score is 4, indicating a 4.8% annual risk of stroke.   S/p afib ablation 07/15/22 Patient in a typical appearing atrial flutter. We discussed rhythm control options. Short term, will plan for DCCV. Long term, will refer to EP to discuss ablation. He would rather not take long term AADs.  Continue Xarelto  20 mg daily Continue Lopressor  25 mg PRN q 6 hours for heart racing.  Check bmet/cbc Apple Watch for home monitoring.   Secondary Hypercoagulable State (ICD10:  D68.69) The patient is at significant risk for stroke/thromboembolism based upon his CHA2DS2-VASc Score of 4.  Continue Rivaroxaban  (Xarelto ). No bleeding issues.   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   Informed Consent   Shared Decision Making/Informed Consent The risks (stroke, cardiac arrhythmias rarely resulting in the need for a temporary or permanent pacemaker, skin irritation or burns and complications associated with conscious sedation including aspiration, arrhythmia, respiratory failure and death), benefits (restoration of normal sinus rhythm) and alternatives of a direct current cardioversion were explained in detail to Benjamin [name] and he agrees to proceed.       Daril Kicks PA-C [facility] [address] [place], [place] [address] [phone]

## 2024-01-27 NOTE — H&P (View-Only) (Signed)
 Primary Care Physician: Epifanio Alm SQUIBB, MD Primary Cardiologist: Lynwood Rakers, MD (Inactive) Electrophysiologist: OLE ONEIDA HOLTS, MD  Referring Physician: Dr Rakers Benjamin [name] is a 73 y.o. male with a history of DM, HLD, HTN, CAD, SVT, atrial flutter, atrial fibrillation who presents for follow up in the [facility]. Patient is on Xarelto  for stroke prevention. He was seen by Dr Holts 03/22/22 and was having more frequent episodes of afib. He underwent afib and SVT ablation on 07/15/22.  Patient returns for follow up for atrial fibrillation and atrial flutter. He was found to be in atrial flutter at his visit on 01/06/24. A cardiac monitor was ordered which showed 100% flutter burden. He remains in rate controlled atrial flutter today. He does have some mild SOB with exertion and pedal edema but otherwise is unaware of his arrhythmia. No bleeding issues on anticoagulation.   Today, he  denies symptoms of palpitations, chest pain, orthopnea, PND, dizziness, presyncope, syncope, snoring, daytime somnolence, bleeding, or neurologic sequela. The patient is tolerating medications without difficulties and is otherwise without complaint today.    Atrial Fibrillation Risk Factors:  he does not have symptoms or diagnosis of sleep apnea. he does not have a history of rheumatic fever.   Atrial Fibrillation Management history:  Previous antiarrhythmic drugs: none Previous cardioversions: none Previous ablations: 07/15/22 Anticoagulation history: Xarelto    ROS- All systems are reviewed and negative except as per the HPI above.   Physical Exam: BP (!) 140/62   Pulse 69   Ht 5' 9 (1.753 m)   Wt 77.7 kg   BMI 25.28 kg/m   GEN: Well nourished, well developed in no acute distress CARDIAC: Regular rate and rhythm, no murmurs, rubs, gallops RESPIRATORY:  Clear to auscultation without rales, wheezing or rhonchi  ABDOMEN: Soft, non-tender,  non-distended EXTREMITIES:  No edema; No deformity   Wt Readings from Last 3 Encounters:  01/27/24 77.7 kg  01/06/24 75.5 kg  07/06/23 75.6 kg     EKG Interpretation Date/Time:  Tuesday January 27 2024 15:04:10 EST Ventricular Rate:  69 PR Interval:    QRS Duration:  84 QT Interval:  416 QTC Calculation: 445 R Axis:   70  Text Interpretation: Atrial flutter with 4:1 A-V conduction Nonspecific ST and T wave abnormality Abnormal ECG When compared with ECG of 06-Jan-2024 07:48, No significant change was found Confirmed by Odena Mcquaid ([id number]) on 01/27/2024 3:14:51 PM   Echo 03/06/16 demonstrated  - Left ventricle: The cavity size was normal. Wall thickness was    normal. Systolic function was normal. The estimated ejection    fraction was in the range of 60% to 65%. Wall motion was normal;    there were no regional wall motion abnormalities. Doppler    parameters are consistent with abnormal left ventricular    relaxation (grade 1 diastolic dysfunction).  - Aortic valve: There was no stenosis.  - Aorta: Ascending aortic diameter: 38 mm (S).  - Ascending aorta: The ascending aorta was mildly dilated.  - Mitral valve: There was trivial regurgitation.  - Right ventricle: The cavity size was normal. Systolic function    was normal.  - Tricuspid valve: Peak RV-RA gradient (S): 18 mm Hg.  - Pulmonary arteries: PA peak pressure: 23 mm Hg (S).  - Inferior vena cava: The vessel was normal in size. The    respirophasic diameter changes were in the normal range (>= 50%),    consistent with normal central venous  pressure.   Impressions:   - Normal LV size with EF 60-65%. Normal RV size and systolic    function. No significant valvular abnormalities.    CHA2DS2-VASc Score = 4  The patient's score is based upon: CHF History: 0 HTN History: 1 Diabetes History: 1 Stroke History: 0 Vascular Disease History: 1 Age Score: 1 Gender Score: 0       ASSESSMENT AND PLAN: Persistent  Atrial Fibrillation/atrial flutter (ICD10:  I48.19) The patient's CHA2DS2-VASc score is 4, indicating a 4.8% annual risk of stroke.   S/p afib ablation 07/15/22 Patient in a typical appearing atrial flutter. We discussed rhythm control options. Short term, will plan for DCCV. Long term, will refer to EP to discuss ablation. He would rather not take long term AADs.  Continue Xarelto  20 mg daily Continue Lopressor  25 mg PRN q 6 hours for heart racing.  Check bmet/cbc Apple Watch for home monitoring.   Secondary Hypercoagulable State (ICD10:  D68.69) The patient is at significant risk for stroke/thromboembolism based upon his CHA2DS2-VASc Score of 4.  Continue Rivaroxaban  (Xarelto ). No bleeding issues.   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   Informed Consent   Shared Decision Making/Informed Consent The risks (stroke, cardiac arrhythmias rarely resulting in the need for a temporary or permanent pacemaker, skin irritation or burns and complications associated with conscious sedation including aspiration, arrhythmia, respiratory failure and death), benefits (restoration of normal sinus rhythm) and alternatives of a direct current cardioversion were explained in detail to Benjamin [name] and he agrees to proceed.       Daril Kicks PA-C [facility] [address] [place], [place] [address] [phone]

## 2024-01-28 ENCOUNTER — Ambulatory Visit (HOSPITAL_COMMUNITY): Payer: Self-pay | Admitting: Physician Assistant

## 2024-01-28 LAB — CBC
Hematocrit: 39.7 % (ref 37.5–51.0)
Hemoglobin: 13.1 g/dL (ref 13.0–17.7)
MCH: 28.2 pg (ref 26.6–33.0)
MCHC: 33 g/dL (ref 31.5–35.7)
MCV: 86 fL (ref 79–97)
Platelets: 306 x10E3/uL (ref 150–450)
RBC: 4.64 x10E6/uL (ref 4.14–5.80)
RDW: 13.9 % (ref 11.6–15.4)
WBC: 8.4 x10E3/uL (ref 3.4–10.8)

## 2024-01-28 LAB — BASIC METABOLIC PANEL WITH GFR
BUN/Creatinine Ratio: 23 (ref 10–24)
BUN: 28 mg/dL — ABNORMAL HIGH (ref 8–27)
CO2: 27 mmol/L (ref 20–29)
Calcium: 9.4 mg/dL (ref 8.6–10.2)
Chloride: 99 mmol/L (ref 96–106)
Creatinine, Ser: 1.22 mg/dL (ref 0.76–1.27)
Glucose: 214 mg/dL — ABNORMAL HIGH (ref 70–99)
Potassium: 4.1 mmol/L (ref 3.5–5.2)
Sodium: 139 mmol/L (ref 134–144)
eGFR: 62 mL/min/1.73 (ref >59)

## 2024-01-30 NOTE — Progress Notes (Signed)
 Pt called for pre procedure instructions.  Message left on ID voicemail Arrival time 0930 NPO after midnight explained Instructed to take am meds with sip of water and confirmed blood thinner consistency Instructed pt need for ride home tomorrow and have responsible adult with them for 24 hrs post procedure.

## 2024-02-02 ENCOUNTER — Encounter (HOSPITAL_COMMUNITY): Payer: Self-pay | Admitting: Internal Medicine

## 2024-02-02 ENCOUNTER — Ambulatory Visit (HOSPITAL_COMMUNITY)
Admission: RE | Admit: 2024-02-02 | Discharge: 2024-02-02 | Disposition: A | Discharge status: Home / Self Care | Attending: Internal Medicine | Admitting: Internal Medicine

## 2024-02-02 ENCOUNTER — Encounter (HOSPITAL_COMMUNITY)
Admission: RE | Disposition: A | Discharge status: Home / Self Care | Payer: Self-pay | Source: Home / Self Care | Attending: Internal Medicine

## 2024-02-02 ENCOUNTER — Ambulatory Visit (HOSPITAL_COMMUNITY): Admitting: Anesthesiology

## 2024-02-02 ENCOUNTER — Other Ambulatory Visit: Payer: Self-pay

## 2024-02-02 DIAGNOSIS — I4891 Unspecified atrial fibrillation: Secondary | ICD-10-CM

## 2024-02-02 DIAGNOSIS — Z79899 Other long term (current) drug therapy: Secondary | ICD-10-CM | POA: Diagnosis not present

## 2024-02-02 DIAGNOSIS — I10 Essential (primary) hypertension: Secondary | ICD-10-CM | POA: Insufficient documentation

## 2024-02-02 DIAGNOSIS — I2510 Atherosclerotic heart disease of native coronary artery without angina pectoris: Secondary | ICD-10-CM | POA: Insufficient documentation

## 2024-02-02 DIAGNOSIS — I4819 Other persistent atrial fibrillation: Secondary | ICD-10-CM | POA: Insufficient documentation

## 2024-02-02 DIAGNOSIS — D6869 Other thrombophilia: Secondary | ICD-10-CM | POA: Diagnosis not present

## 2024-02-02 DIAGNOSIS — I4719 Other supraventricular tachycardia: Secondary | ICD-10-CM | POA: Diagnosis not present

## 2024-02-02 DIAGNOSIS — Z7901 Long term (current) use of anticoagulants: Secondary | ICD-10-CM | POA: Diagnosis not present

## 2024-02-02 DIAGNOSIS — E785 Hyperlipidemia, unspecified: Secondary | ICD-10-CM | POA: Insufficient documentation

## 2024-02-02 DIAGNOSIS — I4892 Unspecified atrial flutter: Secondary | ICD-10-CM | POA: Diagnosis not present

## 2024-02-02 DIAGNOSIS — E119 Type 2 diabetes mellitus without complications: Secondary | ICD-10-CM | POA: Insufficient documentation

## 2024-02-02 DIAGNOSIS — I483 Typical atrial flutter: Secondary | ICD-10-CM

## 2024-02-02 HISTORY — PX: CARDIOVERSION: EP1203

## 2024-02-02 SURGERY — CARDIOVERSION (CATH LAB)
Anesthesia: General

## 2024-02-02 MED ORDER — SODIUM CHLORIDE 0.9% FLUSH
INTRAVENOUS | Status: DC | PRN
  Administered 2024-02-02: 10 mL via INTRAVENOUS

## 2024-02-02 MED ORDER — SODIUM CHLORIDE 0.9 % IV SOLN: INTRAVENOUS | Status: DC

## 2024-02-02 MED ORDER — PROPOFOL 10 MG/ML IV BOLUS
INTRAVENOUS | Status: DC | PRN
  Administered 2024-02-02: 40 mg via INTRAVENOUS
  Administered 2024-02-02: 20 mg via INTRAVENOUS
  Administered 2024-02-02: 30 mg via INTRAVENOUS

## 2024-02-02 SURGICAL SUPPLY — 1 items: PAD DEFIB RADIO PHYSIO CONN (PAD) ×1 IMPLANT

## 2024-02-02 NOTE — CV Procedure (Signed)
 Procedure: Electrical Cardioversion Indications:  Atrial Fibrillation  Procedure Details:  Consent: Risks of procedure as well as the alternatives and risks of each were explained to the (patient/caregiver).  Consent for procedure obtained.  Time Out: Verified patient identification, verified procedure, site/side was marked, verified correct patient position, special equipment/implants available, medications/allergies/relevent history reviewed, required imaging and test results available. PERFORMED.  Patient placed on cardiac monitor, pulse oximetry, supplemental oxygen as necessary.  Sedation given: propofol  per anesthesia Pacer pads placed anterior and posterior chest.  Cardioverted 1 time(s).  Cardioversion with synchronized biphasic 200J shock.  Evaluation: Findings: Post procedure EKG shows: NSR Complications: None Patient did tolerate procedure well.  Time Spent Directly with the Patient:  15 minutes   Benjamin [name] A Shyquan Stallbaumer 02/02/2024, 10:20 AM

## 2024-02-02 NOTE — Discharge Instructions (Signed)

## 2024-02-02 NOTE — Interval H&P Note (Signed)
 History and Physical Interval Note:  02/02/2024 10:08 AM  Benjamin [name]  has presented today for surgery, with the diagnosis of AFIB.  The various methods of treatment have been discussed with the patient and family. After consideration of risks, benefits and other options for treatment, the patient has consented to  Procedures: CARDIOVERSION (N/A) as a surgical intervention.  The patient's history has been reviewed, patient examined, no change in status, stable for surgery.  I have reviewed the patient's chart and labs.  Questions were answered to the patient's satisfaction.     Aylin Rhoads A Tami Blass

## 2024-02-02 NOTE — Anesthesia Preprocedure Evaluation (Addendum)
 "                                  Anesthesia Evaluation  Patient identified by MRN, date of birth, ID band Patient awake    Reviewed: Allergy & Precautions, NPO status , Patient's Chart, lab work & pertinent test results  History of Anesthesia Complications Negative for: history of anesthetic complications  Airway Mallampati: III  TM Distance: >3 FB Neck ROM: Full    Dental  (+) Dental Advisory Given   Pulmonary neg COPD, neg recent URI, former smoker   breath sounds clear to auscultation       Cardiovascular hypertension, (-) angina + dysrhythmias Atrial Fibrillation  Rhythm:Irregular     Neuro/Psych negative neurological ROS  negative psych ROS   GI/Hepatic negative GI ROS, Neg liver ROS,,,  Endo/Other  diabetes  Lab Results      Component                Value               Date                      HGBA1C                   7.2 (A)             08/27/2021             Renal/GU Lab Results      Component                Value               Date                      NA                       139                 01/27/2024                K                        4.1                 01/27/2024                CO2                      27                  01/27/2024                GLUCOSE                  214 (H)             01/27/2024                BUN                      28 (H)              01/27/2024                CREATININE  1.22                01/27/2024                CALCIUM                   9.4                 01/27/2024                EGFR                     62                  01/27/2024                GFRNONAA                 66                  04/04/2020                Musculoskeletal  (+) Arthritis ,    Abdominal   Peds  Hematology Lab Results      Component                Value               Date                      WBC                      8.4                 01/27/2024                HGB                      13.1                 01/27/2024                HCT                      39.7                01/27/2024                MCV                      86                  01/27/2024                PLT                      306                 01/27/2024              Anesthesia Other Findings   Reproductive/Obstetrics                              Anesthesia Physical Anesthesia Plan  ASA: 2  Anesthesia Plan: General   Post-op Pain Management: Minimal or no pain anticipated   Induction: Intravenous  PONV Risk Score  and Plan: 2 and Treatment may vary due to age or medical condition  Airway Management Planned: Nasal Cannula, Simple Face Mask and Natural Airway  Additional Equipment: None  Intra-op Plan:   Post-operative Plan:   Informed Consent: I have reviewed the patients History and Physical, chart, labs and discussed the procedure including the risks, benefits and alternatives for the proposed anesthesia with the patient or authorized representative who has indicated his/her understanding and acceptance.     Dental advisory given  Plan Discussed with: CRNA  Anesthesia Plan Comments:          Anesthesia Quick Evaluation  "

## 2024-02-02 NOTE — Transfer of Care (Signed)
 Immediate Anesthesia Transfer of Care Note  Patient: Benjamin [name]  Procedure(s) Performed: CARDIOVERSION  Patient Location: Cath Lab  Anesthesia Type:General  Level of Consciousness: drowsy and patient cooperative  Airway & Oxygen Therapy: Patient Spontanous Breathing and Patient connected to nasal cannula oxygen  Post-op Assessment: Report given to RN, Post -op Vital signs reviewed and stable, and Patient moving all extremities X 4  Post vital signs: Reviewed and stable  Last Vitals:  Vitals Value Taken Time  BP 102/59   Temp    Pulse 72 02/02/24 10:17  Resp 16 02/02/24 10:17  SpO2 98 % 02/02/24 10:17    Last Pain:  Vitals:   02/02/24 0825  TempSrc: Temporal         Complications: No notable events documented.

## 2024-02-03 ENCOUNTER — Encounter (HOSPITAL_COMMUNITY): Payer: Self-pay | Admitting: Internal Medicine

## 2024-02-04 NOTE — Anesthesia Postprocedure Evaluation (Signed)
"   Anesthesia Post Note  Patient: Benjamin [name]  Procedure(s) Performed: CARDIOVERSION     Patient location during evaluation: Cath Lab Anesthesia Type: General Level of consciousness: awake and alert Pain management: pain level controlled Vital Signs Assessment: post-procedure vital signs reviewed and stable Respiratory status: spontaneous breathing, nonlabored ventilation and respiratory function stable Cardiovascular status: blood pressure returned to baseline and stable Postop Assessment: no apparent nausea or vomiting Anesthetic complications: no   No notable events documented.                Nyssa Sayegh      "

## 2024-02-11 ENCOUNTER — Ambulatory Visit (HOSPITAL_COMMUNITY)
Admission: RE | Admit: 2024-02-11 | Discharge: 2024-02-11 | Disposition: A | Discharge status: Home / Self Care | Source: Ambulatory Visit | Attending: Surgery | Admitting: Surgery

## 2024-02-11 DIAGNOSIS — I483 Typical atrial flutter: Secondary | ICD-10-CM | POA: Insufficient documentation

## 2024-02-11 LAB — ECHOCARDIOGRAM COMPLETE
AR max vel: 2.94 cm2
AV Area VTI: 2.88 cm2
AV Area mean vel: 2.83 cm2
AV Mean grad: 2 mmHg
AV Peak grad: 3.6 mmHg
Ao pk vel: 0.94 m/s
Area-P 1/2: 3.31 cm2
S' Lateral: 3.03 cm

## 2024-02-13 MED ORDER — FUROSEMIDE 40 MG PO TABS: 40.0000 mg | ORAL_TABLET | Freq: Every day | ORAL | 3 refills | Status: AC | PRN

## 2024-02-13 NOTE — Telephone Encounter (Signed)
 Spoke with pt regarding test results. Pt verbalized understanding. Pt stated he has some slight swelling in his ankles and no other symptoms. Pt was advised Thom will be made aware and  we would reach out to discuss further.

## 2024-02-13 NOTE — Telephone Encounter (Signed)
 Spoke with regarding test results. Pt verbalized understanding and agreed to plan. Medication sent to [facility] per pt request. Pt was advised to call our office if he has any further concerns.

## 2024-03-02 ENCOUNTER — Ambulatory Visit: Admitting: Student in an Organized Health Care Education/Training Program

## 2024-03-02 ENCOUNTER — Encounter: Payer: Self-pay | Admitting: Student in an Organized Health Care Education/Training Program

## 2024-03-02 VITALS — BP 120/74 | HR 72 | Ht 69.0 in | Wt 167.0 lb

## 2024-03-02 DIAGNOSIS — I4892 Unspecified atrial flutter: Secondary | ICD-10-CM | POA: Diagnosis not present

## 2024-03-02 DIAGNOSIS — Z01812 Encounter for preprocedural laboratory examination: Secondary | ICD-10-CM

## 2024-03-02 NOTE — Progress Notes (Signed)
 " Cardiology Office Note   Date: 03/02/24 ID:  Benjamin [name], DOB 1950/03/24, MRN [id number] PCP: Epifanio Alm SQUIBB, MD  [facility] Providers Cardiologist:  Lynwood Rakers, MD (Inactive) Electrophysiologist:  Donnice DELENA Primus, MD   History of Present Illness Benjamin [name] is a 74 y.o. 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 Cindie), HTN, DM2 who presents for atrial arrhythmia follow-up.  Discussed the use of AI scribe software for clinical note transcription with the patient, who gave verbal consent to proceed. History of Present Illness Benjamin [name] is a 74 year old male with atrial fibrillation and atrial flutter who presents for evaluation of recurrent atrial flutter.  He has a history of atrial fibrillation and atrial flutter, for which he previously underwent an ablation. Initially, the ablation was successful, but he has since experienced recurrent episodes of atypical atrial flutter, necessitating two cardioversions.  His atrial flutter episodes have been associated with fatigue and swelling in his legs, which began in October. He uses a KardiaMobile device to monitor his heart rhythm but finds it difficult to distinguish between atrial fibrillation and flutter based on the device's readings. His heart rate has not been excessively high since the ablation, even during exercise.  His wife, Benjamin [name], reports that he has been more fatigued than usual, impacting his ability to keep up with his grandchildren. 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  He is currently taking Xarelto  at night. His son is in kidney failure and is awaiting a transplant, which adds to the family's stress. His wife is trying to see if she can be a swap for a donor for her son as both her and her husband aren't matches.   ROS: palpitations   Studies  Reviewed  ECG review 02/02/24: (10:33:46) NSR 64, PR 228, QRS 88, QT/c 446/460  02/02/24: (08:29:58) atypical AFL/VR 69, QRS 84, QT/c 434/465, AFL TCL 228 ms 01/27/24: AFL/VR 69, QRS 84, QT/c 416/445, AFL TCL 228 ms  01/06/24: AFL/VR 70, QRS 86, QT/c 410/442 07/03/23: NSR 65, PR 216, QRS 84, QT/c 416/432  TTE Result date: 02/11/24  1. Left ventricular ejection fraction, by estimation, is 50 to 55%. The  left ventricle has low normal function. Left ventricular endocardial  border not optimally defined to evaluate regional wall motion. Left  ventricular diastolic parameters were  normal.   2. Right ventricular systolic function is normal. The right ventricular  size is normal. There is normal pulmonary artery systolic pressure.   3. The mitral valve is normal in structure. No evidence of mitral valve  regurgitation. No evidence of mitral stenosis.   4. The aortic valve is tricuspid. Aortic valve regurgitation is not  visualized. No aortic stenosis is present.   5. The inferior vena cava is dilated in size with <50% respiratory  variability, suggesting right atrial pressure of 15 mmHg.   Risk Assessment/Calculations  CHA2DS2-VASc Score = 4  This indicates a 4.8% annual risk of stroke. The patient's score is based upon: CHF History: 0 HTN History: 1 Diabetes History: 1 Stroke History: 0 Vascular Disease History: 1 Age Score: 1 Gender Score: 0   Physical Exam VS:  BP 120/74 (BP Location: Left Arm, Patient Position: Sitting, Cuff Size: Large)   Pulse 72   Ht 5' 9 (1.753 m)   Wt 167 lb (75.8 kg)   SpO2 98%  BMI 24.66 kg/m   Wt Readings from Last 3 Encounters:  03/02/24 167 lb (75.8 kg)  02/02/24 163 lb (73.9 kg)  01/27/24 171 lb 3.2 oz (77.7 kg)    GEN: Well nourished, well developed in no acute distress NECK: No JVD; No carotid bruits CARDIAC: RRR, no murmurs, rubs, gallops RESPIRATORY:  Clear to auscultation without rales, wheezing or rhonchi  ABDOMEN: Soft,  non-tender, non-distended EXTREMITIES:  No edema; No deformity   ASSESSMENT AND PLAN Benjamin [name] is a 74 y.o. 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 Cindie), HTN, DM2 who presents for atrial arrhythmia follow-up. Assessment & Plan Atypical atrial flutter Recurrent atrial flutter requiring cardioversions. Ablation considered due to recurrence and potential impact on cardiac function. Explained procedure, risks, and benefits. Scheduled ablation after shared decision-making.  First occurrence of atypical AFL was on follow-up 11/25/2022 and he underwent successful cardioversion on 12/01/2022.  On his follow-up on 01/06/2024 he was found to be in persistent AFL which was rate controlled.  Subsequent monitor with 100% AFL burden.  He underwent another cardioversion on 02/02/2024 and has maintained NSR since then. This is likely a perimitral AFL but could be typical since CTI wasn't initially ablated. Although inferior leads look like typical AFL, V1 with longer isoelectric period more consistent with atypical AFL. Will try to induce, likely will end up with both mitral line and CTI ablation.  - Scheduled ablation procedure for atypical atrial flutter. - Will induce atrial flutter in the lab if not present. - Will map and ablate the flutter circuit. - Will review previous ablation work by Dr. Cindie. - Take Xarelto  night prior, hold Jardiance  3 days prior, all AM meds hold day of procedure   Discussed treatment options today for AF including antiarrhythmic drug therapy and ablation. Discussed risks, recovery and likelihood of success with each treatment strategy. Risk, benefits, and alternatives to EP study and ablation for afib were discussed. These risks include but are not limited to stroke, bleeding, vascular damage, tamponade, perforation, damage to the esophagus, lungs, phrenic nerve and other structures, worsening renal function,  coronary vasospasm and death.  Discussed potential need for repeat ablation procedures and antiarrhythmic drugs after an initial ablation. The patient understands these risk and wishes to proceed.  We will therefore proceed with catheter ablation. Carto, ICE, anesthesia are requested for the procedure.   Atrial fibrillation Previous ablation and cardioversion. Currently well-controlled with no significant symptoms. Current management effective. - Continue current management for atrial fibrillation.  Pre-procedure details: Procedure date: 03/15/24 Carto/ICE/GA-anesthesia, no CT scan pre procedure  Hold all medications the morning of the procedure Take Xarelto  and all medications the night prior    Dispo: RTC post ablation   A total of 45 minutes was spent preparing for the patient, reviewing history, performing exam, document encounter, coordinating care and counseling the patient. 25 minutes was spent with direct patient care.   Signed, Donnice DELENA Primus, MD  "

## 2024-03-02 NOTE — Patient Instructions (Signed)
 Medication Instructions:  Your physician recommends that you continue on your current medications as directed. Please refer to the Current Medication list given to you today.  *If you need a refill on your cardiac medications before your next appointment, please call your pharmacy*  Testing/Procedures: Ablation Your physician has recommended that you have an ablation. Catheter ablation is a medical procedure used to treat some cardiac arrhythmias (irregular heartbeats). During catheter ablation, a long, thin, flexible tube is put into a blood vessel in your groin (upper thigh), or neck. This tube is called an ablation catheter. It is then guided to your heart through the blood vessel. Radio frequency waves destroy small areas of heart tissue where abnormal heartbeats may cause an arrhythmia to start.   You are scheduled for Atrial Flutter Ablation on Monday, February 2 with Dr. Dr. [name]. Please arrive at the Main Entrance A at [facility]: [address] [place], [place] [address] at 7:30 AM   What To Expect: Labs today Labs: you will need to have lab work drawn within 30 days of your procedure. Please go to any [facility] location to have these drawn - no appointment is needed. You will receive procedure instructions either through MyChart or in the mail 4-6 week prior to your procedure.  After your procedure we recommend no driving for 4 days, no lifting over 5 lbs for 7 days, and no work or strenuous activity for 7 days.  Please contact our office at [phone] if you have any questions.    Follow-Up: We will contact you to schedule your post-procedure appointments.

## 2024-03-02 NOTE — H&P (View-Only) (Signed)
 " Cardiology Office Note   Date: 03/02/24 ID:  Benjamin [name], DOB 1950/03/24, MRN [id number] PCP: Epifanio Alm SQUIBB, MD  [facility] Providers Cardiologist:  Lynwood Rakers, MD (Inactive) Electrophysiologist:  Donnice DELENA Primus, MD   History of Present Illness Benjamin [name] is a 74 y.o. 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 Cindie), HTN, DM2 who presents for atrial arrhythmia follow-up.  Discussed the use of AI scribe software for clinical note transcription with the patient, who gave verbal consent to proceed. History of Present Illness Benjamin [name] is a 74 year old male with atrial fibrillation and atrial flutter who presents for evaluation of recurrent atrial flutter.  He has a history of atrial fibrillation and atrial flutter, for which he previously underwent an ablation. Initially, the ablation was successful, but he has since experienced recurrent episodes of atypical atrial flutter, necessitating two cardioversions.  His atrial flutter episodes have been associated with fatigue and swelling in his legs, which began in October. He uses a KardiaMobile device to monitor his heart rhythm but finds it difficult to distinguish between atrial fibrillation and flutter based on the device's readings. His heart rate has not been excessively high since the ablation, even during exercise.  His wife, Benjamin [name], reports that he has been more fatigued than usual, impacting his ability to keep up with his grandchildren. 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  He is currently taking Xarelto  at night. His son is in kidney failure and is awaiting a transplant, which adds to the family's stress. His wife is trying to see if she can be a swap for a donor for her son as both her and her husband aren't matches.   ROS: palpitations   Studies  Reviewed  ECG review 02/02/24: (10:33:46) NSR 64, PR 228, QRS 88, QT/c 446/460  02/02/24: (08:29:58) atypical AFL/VR 69, QRS 84, QT/c 434/465, AFL TCL 228 ms 01/27/24: AFL/VR 69, QRS 84, QT/c 416/445, AFL TCL 228 ms  01/06/24: AFL/VR 70, QRS 86, QT/c 410/442 07/03/23: NSR 65, PR 216, QRS 84, QT/c 416/432  TTE Result date: 02/11/24  1. Left ventricular ejection fraction, by estimation, is 50 to 55%. The  left ventricle has low normal function. Left ventricular endocardial  border not optimally defined to evaluate regional wall motion. Left  ventricular diastolic parameters were  normal.   2. Right ventricular systolic function is normal. The right ventricular  size is normal. There is normal pulmonary artery systolic pressure.   3. The mitral valve is normal in structure. No evidence of mitral valve  regurgitation. No evidence of mitral stenosis.   4. The aortic valve is tricuspid. Aortic valve regurgitation is not  visualized. No aortic stenosis is present.   5. The inferior vena cava is dilated in size with <50% respiratory  variability, suggesting right atrial pressure of 15 mmHg.   Risk Assessment/Calculations  CHA2DS2-VASc Score = 4  This indicates a 4.8% annual risk of stroke. The patient's score is based upon: CHF History: 0 HTN History: 1 Diabetes History: 1 Stroke History: 0 Vascular Disease History: 1 Age Score: 1 Gender Score: 0   Physical Exam VS:  BP 120/74 (BP Location: Left Arm, Patient Position: Sitting, Cuff Size: Large)   Pulse 72   Ht 5' 9 (1.753 m)   Wt 167 lb (75.8 kg)   SpO2 98%  BMI 24.66 kg/m   Wt Readings from Last 3 Encounters:  03/02/24 167 lb (75.8 kg)  02/02/24 163 lb (73.9 kg)  01/27/24 171 lb 3.2 oz (77.7 kg)    GEN: Well nourished, well developed in no acute distress NECK: No JVD; No carotid bruits CARDIAC: RRR, no murmurs, rubs, gallops RESPIRATORY:  Clear to auscultation without rales, wheezing or rhonchi  ABDOMEN: Soft,  non-tender, non-distended EXTREMITIES:  No edema; No deformity   ASSESSMENT AND PLAN Benjamin [name] is a 74 y.o. 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 Cindie), HTN, DM2 who presents for atrial arrhythmia follow-up. Assessment & Plan Atypical atrial flutter Recurrent atrial flutter requiring cardioversions. Ablation considered due to recurrence and potential impact on cardiac function. Explained procedure, risks, and benefits. Scheduled ablation after shared decision-making.  First occurrence of atypical AFL was on follow-up 11/25/2022 and he underwent successful cardioversion on 12/01/2022.  On his follow-up on 01/06/2024 he was found to be in persistent AFL which was rate controlled.  Subsequent monitor with 100% AFL burden.  He underwent another cardioversion on 02/02/2024 and has maintained NSR since then. This is likely a perimitral AFL but could be typical since CTI wasn't initially ablated. Although inferior leads look like typical AFL, V1 with longer isoelectric period more consistent with atypical AFL. Will try to induce, likely will end up with both mitral line and CTI ablation.  - Scheduled ablation procedure for atypical atrial flutter. - Will induce atrial flutter in the lab if not present. - Will map and ablate the flutter circuit. - Will review previous ablation work by Dr. Cindie. - Take Xarelto  night prior, hold Jardiance  3 days prior, all AM meds hold day of procedure   Discussed treatment options today for AF including antiarrhythmic drug therapy and ablation. Discussed risks, recovery and likelihood of success with each treatment strategy. Risk, benefits, and alternatives to EP study and ablation for afib were discussed. These risks include but are not limited to stroke, bleeding, vascular damage, tamponade, perforation, damage to the esophagus, lungs, phrenic nerve and other structures, worsening renal function,  coronary vasospasm and death.  Discussed potential need for repeat ablation procedures and antiarrhythmic drugs after an initial ablation. The patient understands these risk and wishes to proceed.  We will therefore proceed with catheter ablation. Carto, ICE, anesthesia are requested for the procedure.   Atrial fibrillation Previous ablation and cardioversion. Currently well-controlled with no significant symptoms. Current management effective. - Continue current management for atrial fibrillation.  Pre-procedure details: Procedure date: 03/15/24 Carto/ICE/GA-anesthesia, no CT scan pre procedure  Hold all medications the morning of the procedure Take Xarelto  and all medications the night prior    Dispo: RTC post ablation   A total of 45 minutes was spent preparing for the patient, reviewing history, performing exam, document encounter, coordinating care and counseling the patient. 25 minutes was spent with direct patient care.   Signed, Donnice DELENA Primus, MD  "

## 2024-03-02 NOTE — Progress Notes (Deleted)
" °  Cardiology Office Note   Date:  03/02/2024  ID:  Benjamin [name], DOB [birth year], MRN [id number] PCP: Epifanio Alm SQUIBB, MD  [facility] Providers Cardiologist:  Lynwood Rakers, MD (Inactive) Electrophysiologist:  OLE ONEIDA HOLTS, MD (Inactive) { Click to update primary MD,subspecialty MD or APP then REFRESH:1}    History of Present Illness Benjamin [name] is a 74 y.o. male ***  ROS: ***  Studies Reviewed      *** Risk Assessment/Calculations {Does this patient have ATRIAL FIBRILLATION?:[phone]}         Physical Exam VS:  BP 120/74 (BP Location: Left Arm, Patient Position: Sitting, Cuff Size: Large)   Pulse 72   Ht 5' 9 (1.753 m)   Wt 167 lb (75.8 kg)   SpO2 98%   BMI 24.66 kg/m        Wt Readings from Last 3 Encounters:  03/02/24 167 lb (75.8 kg)  02/02/24 163 lb (73.9 kg)  01/27/24 171 lb 3.2 oz (77.7 kg)    GEN: Well nourished, well developed in no acute distress NECK: No JVD; No carotid bruits CARDIAC: ***RRR, no murmurs, rubs, gallops RESPIRATORY:  Clear to auscultation without rales, wheezing or rhonchi  ABDOMEN: Soft, non-tender, non-distended EXTREMITIES:  No edema; No deformity   ASSESSMENT AND PLAN ***    {Are you ordering a CV Procedure (e.g. stress test, cath, DCCV, TEE, etc)?   Press F2        :[id number]}  Dispo: ***  Signed, Donnice DELENA Primus, MD  "

## 2024-03-03 ENCOUNTER — Ambulatory Visit: Admitting: Cardiology

## 2024-03-03 ENCOUNTER — Ambulatory Visit: Payer: Self-pay

## 2024-03-03 ENCOUNTER — Encounter (HOSPITAL_COMMUNITY): Payer: Self-pay

## 2024-03-03 LAB — BASIC METABOLIC PANEL WITH GFR
BUN/Creatinine Ratio: 25 — AB (ref 10–24)
BUN: 27 mg/dL (ref 8–27)
CO2: 21 mmol/L (ref 20–29)
Calcium: 9.7 mg/dL (ref 8.6–10.2)
Chloride: 102 mmol/L (ref 96–106)
Creatinine, Ser: 1.07 mg/dL (ref 0.76–1.27)
Glucose: 140 mg/dL — AB (ref 70–99)
Potassium: 4.6 mmol/L (ref 3.5–5.2)
Sodium: 140 mmol/L (ref 134–144)
eGFR: 73 mL/min/1.73

## 2024-03-03 LAB — CBC
Hematocrit: 43 % (ref 37.5–51.0)
Hemoglobin: 13.7 g/dL (ref 13.0–17.7)
MCH: 27.7 pg (ref 26.6–33.0)
MCHC: 31.9 g/dL (ref 31.5–35.7)
MCV: 87 fL (ref 79–97)
Platelets: 282 x10E3/uL (ref 150–450)
RBC: 4.95 x10E6/uL (ref 4.14–5.80)
RDW: 14.9 % (ref 11.6–15.4)
WBC: 7.7 x10E3/uL (ref 3.4–10.8)

## 2024-03-04 ENCOUNTER — Telehealth: Payer: Self-pay

## 2024-03-04 NOTE — Telephone Encounter (Signed)
-----   Message from Nurse Aldona SAUNDERS, RN sent at 03/02/2024 12:46 PM EST ----- Regarding: 02/02 930 - Atypical Aflutter Benjamin [name] Important: list procedure date as first item in subject line, followed by procedure type (e.g., 10/24/23 AFib ablation)  Precert:  MD: Benjamin [name] Type of ablation: A-flutter Diagnosis: Atypical Aflutter CPT code: A-flutter (06346) Ablation scheduled (date/time): 02/02 930am  Procedure:  Added to calendar? Yes Orders entered? Yes Letter complete? Yes Scheduled with cath lab? Yes Any medications to hold? Routine Labs ordered (CBC, BMET, PT/INR if on warfarin): Yes Mapping system: CARTO (lab 4 or 6) CARTO/OPAL rep notified? Yes Cardiac CT needed? No Dye allergy? N/A Pre-meds ordered and instructions given? N/A Letter method: Patient pick-up H&P: 01/20 Device: No  Follow-up:  Cassie/Angel, please schedule Routine.  Covering RN - please send this message to [employer], EP scheduler, EP Scheduling pool, EP [employer], and CT scheduler (Brittany Lynch/Stephanie Mogg), if indicated.

## 2024-03-17 NOTE — Pre-Procedure Instructions (Signed)
Instructed patient on the following items: Arrival time 0930 Nothing to eat or drink after midnight No meds AM of procedure Responsible person to drive you home and stay with you for 24 hrs  Have you missed any doses of anti-coagulant Xarelto- takes once a day, hasn't missed any doses.

## 2024-03-18 ENCOUNTER — Other Ambulatory Visit: Payer: Self-pay

## 2024-03-18 ENCOUNTER — Encounter (HOSPITAL_COMMUNITY)
Admission: RE | Disposition: A | Payer: Self-pay | Source: Home / Self Care | Attending: Student in an Organized Health Care Education/Training Program

## 2024-03-18 ENCOUNTER — Ambulatory Visit (HOSPITAL_COMMUNITY)

## 2024-03-18 ENCOUNTER — Ambulatory Visit (HOSPITAL_COMMUNITY)
Admission: RE | Admit: 2024-03-18 | Discharge: 2024-03-18 | Disposition: A | Attending: Student in an Organized Health Care Education/Training Program | Admitting: Student in an Organized Health Care Education/Training Program

## 2024-03-18 ENCOUNTER — Encounter (HOSPITAL_COMMUNITY): Payer: Self-pay | Admitting: Student in an Organized Health Care Education/Training Program

## 2024-03-18 DIAGNOSIS — I4892 Unspecified atrial flutter: Secondary | ICD-10-CM

## 2024-03-18 LAB — POCT ACTIVATED CLOTTING TIME
Activated Clotting Time: 266 s
Activated Clotting Time: 455 s
Activated Clotting Time: 327 s

## 2024-03-18 LAB — GLUCOSE, CAPILLARY: Glucose-Capillary: 188 mg/dL — ABNORMAL HIGH (ref 70–99)

## 2024-03-18 MED ORDER — FENTANYL CITRATE (PF) 250 MCG/5ML IJ SOLN
INTRAMUSCULAR | Status: DC | PRN
  Administered 2024-03-18 (×2): 50 ug via INTRAVENOUS

## 2024-03-18 MED ORDER — DEXAMETHASONE SOD PHOSPHATE PF 10 MG/ML IJ SOLN
INTRAMUSCULAR | Status: DC | PRN
  Administered 2024-03-18: 10 mg via INTRAVENOUS

## 2024-03-18 MED ORDER — HEPARIN SODIUM (PORCINE) 1000 UNIT/ML IJ SOLN
INTRAMUSCULAR | Status: DC | PRN
  Administered 2024-03-18: 1000 [IU] via INTRAVENOUS

## 2024-03-18 MED ORDER — ROCURONIUM BROMIDE 10 MG/ML (PF) SYRINGE
PREFILLED_SYRINGE | INTRAVENOUS | Status: DC | PRN
  Administered 2024-03-18: 10 mg via INTRAVENOUS
  Administered 2024-03-18: 50 mg via INTRAVENOUS
  Administered 2024-03-18: 20 mg via INTRAVENOUS

## 2024-03-18 MED ORDER — ONDANSETRON HCL 4 MG/2ML IJ SOLN
INTRAMUSCULAR | Status: DC | PRN
  Administered 2024-03-18: 4 mg via INTRAVENOUS

## 2024-03-18 MED ORDER — MIDAZOLAM HCL 2 MG/2ML IJ SOLN
INTRAMUSCULAR | Status: AC
  Filled 2024-03-18: qty 2

## 2024-03-18 MED ORDER — PROTAMINE SULFATE 10 MG/ML IV SOLN
INTRAVENOUS | Status: DC | PRN
  Administered 2024-03-18: 30 mg via INTRAVENOUS

## 2024-03-18 MED ORDER — FENTANYL CITRATE (PF) 100 MCG/2ML IJ SOLN
INTRAMUSCULAR | Status: AC
  Filled 2024-03-18: qty 2

## 2024-03-18 MED ORDER — PROPOFOL 10 MG/ML IV BOLUS
INTRAVENOUS | Status: DC | PRN
  Administered 2024-03-18: 150 mg via INTRAVENOUS

## 2024-03-18 MED ORDER — PROPOFOL 500 MG/50ML IV EMUL
INTRAVENOUS | Status: DC | PRN
  Administered 2024-03-18: 100 ug/kg/min via INTRAVENOUS

## 2024-03-18 MED ORDER — SODIUM CHLORIDE 0.9 % IV SOLN: 250.0000 mL | INTRAVENOUS | Status: DC | PRN

## 2024-03-18 MED ORDER — EPHEDRINE SULFATE-NACL 50-0.9 MG/10ML-% IV SOSY
PREFILLED_SYRINGE | INTRAVENOUS | Status: DC | PRN
  Administered 2024-03-18: 10 mg via INTRAVENOUS

## 2024-03-18 MED ORDER — SODIUM CHLORIDE 0.9 % IV SOLN: INTRAVENOUS | Status: DC

## 2024-03-18 MED ORDER — HEPARIN SODIUM (PORCINE) 1000 UNIT/ML IJ SOLN
INTRAMUSCULAR | Status: DC | PRN
  Administered 2024-03-18: 14000 [IU] via INTRAVENOUS
  Administered 2024-03-18: 9000 [IU] via INTRAVENOUS

## 2024-03-18 MED ORDER — HEPARIN SODIUM (PORCINE) 1000 UNIT/ML IJ SOLN
INTRAMUSCULAR | Status: AC
  Filled 2024-03-18: qty 10

## 2024-03-18 MED ORDER — HEPARIN (PORCINE) IN NACL 1000-0.9 UT/500ML-% IV SOLN
INTRAVENOUS | Status: DC | PRN
  Administered 2024-03-18 (×2): 500 mL

## 2024-03-18 MED ORDER — SUGAMMADEX SODIUM 200 MG/2ML IV SOLN
INTRAVENOUS | Status: DC | PRN
  Administered 2024-03-18: 200 mg via INTRAVENOUS

## 2024-03-18 MED ORDER — MIDAZOLAM HCL (PF) 2 MG/2ML IJ SOLN
INTRAMUSCULAR | Status: DC | PRN
  Administered 2024-03-18: 2 mg via INTRAVENOUS

## 2024-03-18 MED ORDER — SODIUM CHLORIDE 0.9% FLUSH: 3.0000 mL | Freq: Two times a day (BID) | INTRAVENOUS | Status: DC

## 2024-03-18 MED ORDER — LIDOCAINE 2% (20 MG/ML) 5 ML SYRINGE
INTRAMUSCULAR | Status: DC | PRN
  Administered 2024-03-18: 40 mg via INTRAVENOUS

## 2024-03-18 MED ORDER — PHENYLEPHRINE HCL-NACL 20-0.9 MG/250ML-% IV SOLN
INTRAVENOUS | Status: DC | PRN
  Administered 2024-03-18: 25 ug/min via INTRAVENOUS

## 2024-03-18 MED ORDER — SODIUM CHLORIDE 0.9% FLUSH: 3.0000 mL | INTRAVENOUS | Status: DC | PRN

## 2024-03-18 NOTE — Anesthesia Procedure Notes (Signed)
 Procedure Name: Intubation Date/Time: 03/18/2024 3:29 PM  Performed by: Elby Raelene SAUNDERS, CRNAPre-anesthesia Checklist: Patient identified, Emergency Drugs available, Suction available and Patient being monitored Patient Re-evaluated:Patient Re-evaluated prior to induction Oxygen Delivery Method: Circle System Utilized Preoxygenation: Pre-oxygenation with 100% oxygen Induction Type: IV induction Ventilation: Mask ventilation without difficulty Laryngoscope Size: Miller and 2 Grade View: Grade I Tube type: Oral Tube size: 8.0 mm Number of attempts: 1 Airway Equipment and Method: Stylet, Oral airway and Bite block Placement Confirmation: ETT inserted through vocal cords under direct vision, positive ETCO2 and breath sounds checked- equal and bilateral Secured at: 22 cm Tube secured with: Tape Dental Injury: Teeth and Oropharynx as per pre-operative assessment

## 2024-03-18 NOTE — Interval H&P Note (Signed)
 History and Physical Interval Note:  03/18/2024 2:57 PM  Benjamin [name]  has presented today for surgery, with the diagnosis of aflutter.  The various methods of treatment have been discussed with the patient and family. After consideration of risks, benefits and other options for treatment, the patient has consented to  Procedures: A-FLUTTER ABLATION (N/A) as a surgical intervention.  The patient's history has been reviewed, patient examined, no change in status, stable for surgery.  I have reviewed the patient's chart and labs.  Questions were answered to the patient's satisfaction.     Benjamin [name]

## 2024-03-18 NOTE — Anesthesia Preprocedure Evaluation (Addendum)
"                                    Anesthesia Evaluation  Patient identified by MRN, date of birth, ID band Patient awake    Reviewed: Allergy & Precautions, NPO status , Patient's Chart, lab work & pertinent test results  Airway Mallampati: II  TM Distance: >3 FB Neck ROM: Full    Dental  (+) Teeth Intact, Dental Advisory Given   Pulmonary former smoker   breath sounds clear to auscultation       Cardiovascular hypertension, Pt. on medications and Pt. on home beta blockers + dysrhythmias Atrial Fibrillation  Rhythm:Regular Rate:Normal  Echo:   1. Left ventricular ejection fraction, by estimation, is 50 to 55%. The  left ventricle has low normal function. Left ventricular endocardial  border not optimally defined to evaluate regional wall motion. Left  ventricular diastolic parameters were  normal.   2. Right ventricular systolic function is normal. The right ventricular  size is normal. There is normal pulmonary artery systolic pressure.   3. The mitral valve is normal in structure. No evidence of mitral valve  regurgitation. No evidence of mitral stenosis.   4. The aortic valve is tricuspid. Aortic valve regurgitation is not  visualized. No aortic stenosis is present.   5. The inferior vena cava is dilated in size with <50% respiratory  variability, suggesting right atrial pressure of 15 mmHg.     Neuro/Psych negative neurological ROS  negative psych ROS   GI/Hepatic negative GI ROS, Neg liver ROS,,,  Endo/Other  diabetes, Type 2, Oral Hypoglycemic Agents    Renal/GU negative Renal ROS     Musculoskeletal  (+) Arthritis ,    Abdominal   Peds  Hematology negative hematology ROS (+)   Anesthesia Other Findings   Reproductive/Obstetrics                              Anesthesia Physical Anesthesia Plan  ASA: 3  Anesthesia Plan: General   Post-op Pain Management: Tylenol  PO (pre-op)*   Induction:  Intravenous  PONV Risk Score and Plan: 2 and Ondansetron , Treatment may vary due to age or medical condition and TIVA  Airway Management Planned: Oral ETT  Additional Equipment: None  Intra-op Plan:   Post-operative Plan: Extubation in OR  Informed Consent: I have reviewed the patients History and Physical, chart, labs and discussed the procedure including the risks, benefits and alternatives for the proposed anesthesia with the patient or authorized representative who has indicated his/her understanding and acceptance.       Plan Discussed with: CRNA  Anesthesia Plan Comments:          Anesthesia Quick Evaluation  "

## 2024-03-18 NOTE — Progress Notes (Signed)
 Patient ambulated to the bathroom and was able to void. Patient ambulated in the hallway without difficulty. Right groin site intact. No bleeding or hematoma noted to the site. Patient dressed for discharge no change to groin site.

## 2024-03-18 NOTE — Discharge Instructions (Signed)

## 2024-03-18 NOTE — Transfer of Care (Signed)
 Immediate Anesthesia Transfer of Care Note  Patient: Benjamin [name]  Procedure(s) Performed: A-FLUTTER ABLATION  Patient Location: PACU  Anesthesia Type:General  Level of Consciousness: awake, alert , and oriented  Airway & Oxygen Therapy: Patient Spontanous Breathing and Patient connected to nasal cannula oxygen  Post-op Assessment: Report given to RN and Post -op Vital signs reviewed and stable  Post vital signs: Reviewed and stable  Last Vitals:  Vitals Value Taken Time  BP    Temp    Pulse    Resp    SpO2      Last Pain:  Vitals:   03/18/24 0926  TempSrc: Oral         Complications: No notable events documented.

## 2024-03-19 ENCOUNTER — Telehealth (HOSPITAL_COMMUNITY): Payer: Self-pay

## 2024-03-19 NOTE — Telephone Encounter (Addendum)
 Spoke with patient to complete post procedure follow up call.  Patient reports no complications with groin sites.   Instructions reviewed with patient:  Remove large bandage at puncture site after 24 hours. It is normal to have bruising, tenderness, mild swelling, and a pea or marble sized lump/knot at the groin site which can take up to three months to resolve.  Get help right away if you notice sudden swelling at the puncture site.  Check your puncture site every day for signs of infection: fever, redness, swelling, pus drainage, warmth, foul odor or excessive pain. If this occurs, please call [phone], to speak with the RN Navigator. Get help right away if your puncture site is bleeding and the bleeding does not stop after applying firm pressure to the area.  You may continue to have skipped beats/ atrial flutter during the first several months after your procedure.  It is very important not to miss any doses of your blood thinner Xarelto .    You will follow up with the APP 1 month after your procedure.   Activity restrictions reviewed.  Patient verbalized understanding to all instructions provided.

## 2024-03-19 NOTE — Telephone Encounter (Signed)
 Attempted to reach patient to follow up with procedure completed on 03/18/24, no answer. Left VM for patient to return call.

## 2024-03-19 NOTE — Anesthesia Postprocedure Evaluation (Signed)
"   Anesthesia Post Note  Patient: Benjamin [name]  Procedure(s) Performed: A-FLUTTER ABLATION     Patient location during evaluation: PACU Anesthesia Type: General Level of consciousness: awake and alert Pain management: pain level controlled Vital Signs Assessment: post-procedure vital signs reviewed and stable Respiratory status: spontaneous breathing, nonlabored ventilation, respiratory function stable and patient connected to nasal cannula oxygen Cardiovascular status: blood pressure returned to baseline and stable Postop Assessment: no apparent nausea or vomiting Anesthetic complications: no   No notable events documented.  Last Vitals:  Vitals:   03/18/24 1847 03/18/24 1900  BP: (!) 130/54 (!) 124/59  Pulse: (!) 53 64  Resp: 16 13  Temp:    SpO2: 97% 96%    Last Pain:  Vitals:   03/18/24 1829  TempSrc:   PainSc: 3                  Franky JONETTA Bald      "

## 2024-04-19 ENCOUNTER — Ambulatory Visit: Admitting: Cardiology

## 2024-04-20 ENCOUNTER — Ambulatory Visit: Admitting: Cardiology

## 2024-11-04 ENCOUNTER — Ambulatory Visit: Admitting: Dermatology
